# Patient Record
Sex: Female | Born: 1960 | Race: White | Hispanic: No | Smoking: Former smoker
Health system: Southern US, Community
[De-identification: ages and names within clinical notes are randomized; demographics above are authoritative.]

## PROBLEM LIST (undated history)

## (undated) DIAGNOSIS — D259 Leiomyoma of uterus, unspecified: Secondary | ICD-10-CM

## (undated) DIAGNOSIS — N920 Excessive and frequent menstruation with regular cycle: Secondary | ICD-10-CM

## (undated) DIAGNOSIS — T8859XA Other complications of anesthesia, initial encounter: Secondary | ICD-10-CM

## (undated) DIAGNOSIS — J302 Other seasonal allergic rhinitis: Secondary | ICD-10-CM

## (undated) DIAGNOSIS — N840 Polyp of corpus uteri: Secondary | ICD-10-CM

## (undated) DIAGNOSIS — I10 Essential (primary) hypertension: Secondary | ICD-10-CM

## (undated) DIAGNOSIS — K5730 Diverticulosis of large intestine without perforation or abscess without bleeding: Secondary | ICD-10-CM

## (undated) DIAGNOSIS — B009 Herpesviral infection, unspecified: Secondary | ICD-10-CM

## (undated) DIAGNOSIS — D75839 Thrombocytosis, unspecified: Secondary | ICD-10-CM

## (undated) HISTORY — DX: Other seasonal allergic rhinitis: J30.2

## (undated) HISTORY — DX: Herpesviral infection, unspecified: B00.9

---

## 2000-11-25 ENCOUNTER — Other Ambulatory Visit: Admission: RE | Admit: 2000-11-25 | Discharge: 2000-11-25 | Payer: Self-pay | Admitting: Obstetrics and Gynecology

## 2003-10-17 ENCOUNTER — Other Ambulatory Visit: Admission: RE | Admit: 2003-10-17 | Discharge: 2003-10-17 | Payer: Self-pay | Admitting: Obstetrics and Gynecology

## 2003-11-17 ENCOUNTER — Encounter: Admission: RE | Admit: 2003-11-17 | Discharge: 2003-11-17 | Payer: Self-pay | Admitting: Obstetrics and Gynecology

## 2004-09-04 ENCOUNTER — Ambulatory Visit (HOSPITAL_COMMUNITY): Admission: RE | Admit: 2004-09-04 | Discharge: 2004-09-04 | Payer: Self-pay | Admitting: Obstetrics and Gynecology

## 2004-09-04 ENCOUNTER — Encounter (INDEPENDENT_AMBULATORY_CARE_PROVIDER_SITE_OTHER): Payer: Self-pay | Admitting: Specialist

## 2004-09-04 HISTORY — PX: HYSTEROSCOPY WITH D & C: SHX1775

## 2004-09-08 HISTORY — PX: ABLATION: SHX5711

## 2004-10-29 ENCOUNTER — Other Ambulatory Visit: Admission: RE | Admit: 2004-10-29 | Discharge: 2004-10-29 | Payer: Self-pay | Admitting: Obstetrics and Gynecology

## 2007-03-31 ENCOUNTER — Encounter: Admission: RE | Admit: 2007-03-31 | Discharge: 2007-03-31 | Payer: Self-pay | Admitting: Obstetrics and Gynecology

## 2008-02-02 ENCOUNTER — Emergency Department: Payer: Self-pay | Admitting: Emergency Medicine

## 2010-09-29 ENCOUNTER — Encounter: Payer: Self-pay | Admitting: Obstetrics and Gynecology

## 2011-01-24 NOTE — Op Note (Signed)
Terri Gamble, Terri Gamble          ACCOUNT NO.:  [id number]   MEDICAL RECORD NO.:  [id number]          PATIENT TYPE:  AMB   LOCATION:  SDC                           FACILITY:  [facility]   PHYSICIAN:  Huel Cote, M.D. DATE OF BIRTH:  1960/10/03   DATE OF PROCEDURE:  09/04/2004  DATE OF DISCHARGE:                                 OPERATIVE REPORT   PREOPERATIVE DIAGNOSES:  1.  Abnormal bleeding.  2.  Possible uterine polyps.   POSTOPERATIVE DIAGNOSES:  The intrauterine cavity was distorted with  synechiae versus a septum at the fundal midline.  There were no obvious  polyps noted.  Endometrial curettings were done.   PROCEDURE:  Diagnostic hysteroscopy and dilatation and curettage.   SURGEON:  Huel Cote, M.D.   ANESTHESIA:  LMA.   FINDINGS:  As above, the uterine cavity was noted to be slightly distorted.  There were no polyps noted, and the patient was not a candidate for ablation  given the uterine cavity.   FLUIDS:  Estimated blood loss 100 mL, urine output of 50 mL straight  catheter prior to procedure, and hysteroscopic deficit approximately 300 mL  or less, given that much of the deficit was on the floor and unable to be  measured.   PROCEDURE:  The patient was taken to the operating room, where anesthesia  was obtained without difficulty.  She was then prepped and draped in the  normal sterile fashion in the dorsal lithotomy position.  A speculum was  then placed within the patient's vagina and the cervix identified and  grasped with a single-tooth tenaculum anteriorly.  The uterus was then  sounded to approximately 7 cm and a Hegar dilator placed within the cervical  os and measured the cervix to be approximately 4 cm.  The cervix was then  sequentially easily dilated with the [facility] dilators to a maximum of 31 and  the hysteroscope was easily introduced into the uterus.  This was then  utilized to carefully view the intrauterine cavity.  The cavity itself was  significant for the midline defect, which appeared to be a partial septum at  the midline of the fundus versus an old synechia.  The endometrium itself  did not appear grossly abnormal, and some curettings were obtained.  There  were no polyps noted in the cavity itself and the NovaSure was not felt to  be appropriate given the distortion of the uterine cavity.  The hysteroscope  was then removed and the deficit at one point was noted to be 800 mL.  However, the cervix had been very dilated and most of the deficit was  accounted for as runoff from the vagina onto the floor and drapes, which  probably attested for more than 500 mL of the deficit.  At the conclusion of  the procedure, the deficit meter read approximately 700 mL and again most of  the deficit was accounted for on the drapes and the floor.  Given the  deficit, however, the decision was made not to proceed with any resecting of  the uterine septum, given that the patient is past childbearing age and it  is not likely to be contributing to her abnormal bleeding.  Therefore, all  instruments and sponges were removed from the patient's vagina and the  hysteroscope was removed from her uterus.  The tenaculum was removed from  the anterior lip and  tenaculum site bleeding was controlled with both ring forceps and silver  nitrate stick.  The endometrial curettings were obtained with sharp  curettage and sent to pathology, although they were very scant in nature,  and the patient was then awakened and taken to the recovery room in stable  condition.     Kath   KR/MEDQ  D:  09/04/2004  T:  09/04/2004  Job:  [id number]

## 2011-01-24 NOTE — H&P (Signed)
Terri Gamble, KOVAL          ACCOUNT NO.:  [id number]   MEDICAL RECORD NO.:  [id number]          PATIENT TYPE:  AMB   LOCATION:  SDC                           [facility]:  [facility]   PHYSICIAN:  Huel Cote, M.D. DATE OF BIRTH:  March 11, 1961   DATE OF ADMISSION:  DATE OF DISCHARGE:                                HISTORY & PHYSICAL   HISTORY OF PRESENT ILLNESS:  Ms. Miguez is a 50 year old G2, P2, who was  admitted to undergo a hysteroscopy with possible resection of a polyp and  Novasure ablation for an ongoing problem with persistent daily bleeding.  Beginning in October the patient had been having normal cycles and this  began to become irregular with spotting almost daily.  The patient continued  to spot for approximately eight weeks and had a heavy bleed in November with  clots and heavy flow.  She did have a pregnancy test which was negative and  underwent a sonohysterogram at that time.  On sonohysterogram the patient  had two possible endometrial polyps demonstrated in the uterine fundus and  the cavity was otherwise within normal limits.  Given her very heavy  bleeding the patient was counseled as to possible options.  Given that she  was a smoker there were not any hormonal options and she desired surgical  treatment with polypectomy and possible Novasure ablation.   PAST MEDICAL HISTORY:  Noncontributory.   PAST SURGICAL HISTORY:  Significant for two cesarean sections.   PAST OBSTETRICAL HISTORY:  Just the two C-sections as stated.   PAST GYN HISTORY:  No abnormal Pap smears.   ALLERGIES:  She is LATEX allergic and develops a rash when exposed to this.   MEDICATIONS:  None.   PHYSICAL EXAMINATION:  VITAL SIGNS:  Weight is 235 pounds, blood pressure  120/85.  CARDIAC:  Regular rate and rhythm.  LUNGS:  Clear.  ABDOMEN:  Soft and nontender.  PELVIC:  She has normal external genitalia.  Cervix has no lesions.  Uterus  is upper limits of normal in size.  Adnexa  has no masses.  On  sonohysterogram her uterus measures approximately 10 cm and is retroverted.  She does have a small posterior fibroid; however, no other large fibroids  present.  She had two endometrial polyps which were noted on the ultrasound  extending off the superior fundus.  These measured approximately 1 cm and  she had a second small polyp which appeared on the posterior wall of the  uterus measuring 1 cm.  Given her ongoing problems with bleeding the patient  is desirous of definitive surgical therapy and was counseled for  hysteroscopy and  Novasure ablation.  She understands the risks of the procedure including  bleeding and infection and possible damage to the uterus including uterine  perforation.  Given these risks and consideration the patient elects to  proceed with the procedure as stated.     Kath   KR/MEDQ  D:  08/28/2004  T:  08/28/2004  Job:  [id number]

## 2011-12-11 ENCOUNTER — Emergency Department: Payer: Self-pay | Admitting: *Deleted

## 2011-12-11 LAB — BASIC METABOLIC PANEL
Calcium, Total: 9.1 mg/dL (ref 8.5–10.1)
Chloride: 104 mmol/L (ref 98–107)
Co2: 27 mmol/L (ref 21–32)
EGFR (African American): >60
Glucose: 83 mg/dL (ref 65–99)
Potassium: 4.1 mmol/L (ref 3.5–5.1)

## 2011-12-11 LAB — CBC
MCHC: 33.8 g/dL (ref 32.0–36.0)
Platelet: 371 10*3/uL (ref 150–440)
RBC: 4.83 10*6/uL (ref 3.80–5.20)

## 2014-09-08 HISTORY — PX: COLONOSCOPY: SHX174

## 2014-11-15 ENCOUNTER — Ambulatory Visit (AMBULATORY_SURGERY_CENTER): Payer: Self-pay | Admitting: *Deleted

## 2014-11-15 VITALS — Ht 61.0 in | Wt 252.8 lb

## 2014-11-15 DIAGNOSIS — Z1211 Encounter for screening for malignant neoplasm of colon: Secondary | ICD-10-CM

## 2014-11-15 MED ORDER — MOVIPREP 100 G PO SOLR: ORAL | Status: DC

## 2014-11-15 NOTE — Progress Notes (Signed)
No allergies to eggs or soy. No problems with anesthesia.  Pt given Emmi instructions for colonoscopy  No oxygen use  No diet drug use  

## 2014-11-22 ENCOUNTER — Telehealth: Payer: Self-pay | Admitting: Gastroenterology

## 2014-11-22 NOTE — Telephone Encounter (Signed)
Yes charge 

## 2014-11-24 ENCOUNTER — Encounter: Payer: Self-pay | Admitting: Gastroenterology

## 2014-12-12 ENCOUNTER — Encounter: Payer: Self-pay | Admitting: Gastroenterology

## 2015-01-19 ENCOUNTER — Encounter: Payer: Self-pay | Admitting: Gastroenterology

## 2015-01-26 ENCOUNTER — Encounter: Payer: Self-pay | Admitting: Gastroenterology

## 2015-03-01 ENCOUNTER — Encounter: Payer: Self-pay | Admitting: Gastroenterology

## 2015-03-09 ENCOUNTER — Encounter: Payer: Self-pay | Admitting: Gastroenterology

## 2015-05-22 ENCOUNTER — Ambulatory Visit (AMBULATORY_SURGERY_CENTER): Payer: Self-pay | Admitting: *Deleted

## 2015-05-22 VITALS — Ht 61.0 in | Wt 256.0 lb

## 2015-05-22 DIAGNOSIS — Z1211 Encounter for screening for malignant neoplasm of colon: Secondary | ICD-10-CM

## 2015-05-22 MED ORDER — NA SULFATE-K SULFATE-MG SULF 17.5-3.13-1.6 GM/177ML PO SOLN: 1.0000 | Freq: Once | ORAL | Status: DC

## 2015-05-22 NOTE — Progress Notes (Signed)
No egg or soy allergy. No anesthesia problems.  No home O2.  No diet meds.  

## 2015-06-01 ENCOUNTER — Encounter: Payer: Self-pay | Admitting: Gastroenterology

## 2015-06-01 ENCOUNTER — Ambulatory Visit (AMBULATORY_SURGERY_CENTER): Payer: 59 | Admitting: Gastroenterology

## 2015-06-01 VITALS — BP 122/69 | HR 84 | Temp 98.2°F | Resp 16 | Ht 61.0 in | Wt 256.0 lb

## 2015-06-01 DIAGNOSIS — Z1211 Encounter for screening for malignant neoplasm of colon: Secondary | ICD-10-CM

## 2015-06-01 MED ORDER — SODIUM CHLORIDE 0.9 % IV SOLN: 500.0000 mL | INTRAVENOUS | Status: DC

## 2015-06-01 NOTE — Patient Instructions (Signed)
YOU HAD AN ENDOSCOPIC PROCEDURE TODAY AT THE [facility]:   Refer to the procedure report that was given to you for any specific questions about what was found during the examination.  If the procedure report does not answer your questions, please call your gastroenterologist to clarify.  If you requested that your care partner not be given the details of your procedure findings, then the procedure report has been included in a sealed envelope for you to review at your convenience later.  YOU SHOULD EXPECT: Some feelings of bloating in the abdomen. Passage of more gas than usual.  Walking can help get rid of the air that was put into your GI tract during the procedure and reduce the bloating. If you had a lower endoscopy (such as a colonoscopy or flexible sigmoidoscopy) you may notice spotting of blood in your stool or on the toilet paper. If you underwent a bowel prep for your procedure, you may not have a normal bowel movement for a few days.  Please Note:  You might notice some irritation and congestion in your nose or some drainage.  This is from the oxygen used during your procedure.  There is no need for concern and it should clear up in a day or so.  SYMPTOMS TO REPORT IMMEDIATELY:   Following lower endoscopy (colonoscopy or flexible sigmoidoscopy):  Excessive amounts of blood in the stool  Significant tenderness or worsening of abdominal pains  Swelling of the abdomen that is new, acute  Fever of 100F or higher   For urgent or emergent issues, a gastroenterologist can be reached at any hour by calling [phone].   DIET: Your first meal following the procedure should be a small meal and then it is ok to progress to your normal diet. Heavy or fried foods are harder to digest and may make you feel nauseous or bloated.  Likewise, meals heavy in dairy and vegetables can increase bloating.  Drink plenty of fluids but you should avoid alcoholic beverages for 24  hours.  ACTIVITY:  You should plan to take it easy for the rest of today and you should NOT DRIVE or use heavy machinery until tomorrow (because of the sedation medicines used during the test).    FOLLOW UP: Our staff will call the number listed on your records the next business day following your procedure to check on you and address any questions or concerns that you may have regarding the information given to you following your procedure. If we do not reach you, we will leave a message.  However, if you are feeling well and you are not experiencing any problems, there is no need to return our call.  We will assume that you have returned to your regular daily activities without incident.  If any biopsies were taken you will be contacted by phone or by letter within the next 1-3 weeks.  Please call us at [phone] if you have not heard about the biopsies in 3 weeks.    SIGNATURES/CONFIDENTIALITY: You and/or your care partner have signed paperwork which will be entered into your electronic medical record.  These signatures attest to the fact that that the information above on your After Visit Summary has been reviewed and is understood.  Full responsibility of the confidentiality of this discharge information lies with you and/or your care-partner. 

## 2015-06-01 NOTE — Progress Notes (Signed)
Report to PACU, RN, vss, BBS= Clear.  

## 2015-06-01 NOTE — Op Note (Signed)
[place]  [address] [place], [address]   COLONOSCOPY PROCEDURE REPORT  PATIENT: Terri Gamble, Terri Gamble  MR#: [id number] BIRTHDATE: [birth year] , 59  yrs. old GENDER: female ENDOSCOPIST: Ladene Artist, MD, [facility] REFERRED BY:  Paula Compton, M.D. PROCEDURE DATE:  06/01/2015 PROCEDURE:   Colonoscopy, screening First Screening Colonoscopy - Avg.  risk and is 50 yrs.  old or older Yes.  Prior Negative Screening - Now for repeat screening. N/A  History of Adenoma - Now for follow-up colonoscopy & has been > or = to 3 yrs.  N/A  Polyps removed today? No Recommend repeat exam, <10 yrs? No ASA CLASS:   Class II INDICATIONS:Screening for colonic neoplasia and Colorectal Neoplasm Risk Assessment for this procedure is average risk. MEDICATIONS: Monitored anesthesia care and Propofol 300 mg IV DESCRIPTION OF PROCEDURE:   After the risks benefits and alternatives of the procedure were thoroughly explained, informed consent was obtained.  The digital rectal exam revealed no abnormalities of the rectum.   The LB PCF Q180 [id number]  endoscope was introduced through the anus and advanced to the cecum, which was identified by both the appendix and ileocecal valve. No adverse events experienced.   The quality of the prep was adequate (Suprep was used)  The instrument was then slowly withdrawn as the colon was fully examined. Estimated blood loss is zero unless otherwise noted in this procedure report.    COLON FINDINGS: There was moderate diverticulosis noted in the sigmoid colon.   There was mild diverticulosis noted at the hepatic flexure.   The examination was otherwise appeared normal. Retroflexed views revealed no abnormalities. The time to cecum = 4.1 Withdrawal time = 9.3   The scope was withdrawn and the procedure completed. COMPLICATIONS: There were no immediate complications.  ENDOSCOPIC IMPRESSION: 1.   Moderate diverticulosis in the sigmoid colon 2.   Mild  diverticulosis at the hepatic flexure 3.   The examination otherwise appeared normal  RECOMMENDATIONS: 1.  High fiber diet with liberal fluid intake. 2.  Continue current colorectal screening recommendations for "routine risk" patients with a repeat colonoscopy in 10 years.  eSigned:  Ladene Artist, MD, [facility] 06/01/2015 10:23 AM

## 2015-06-04 ENCOUNTER — Telehealth: Payer: Self-pay | Admitting: *Deleted

## 2015-06-04 NOTE — Telephone Encounter (Signed)
  Follow up Call-  Call back number 06/01/2015  Post procedure Call Back phone  # [phone]  Permission to leave phone message Yes     No answer, left message.

## 2015-06-05 HISTORY — PX: COLONOSCOPY WITH PROPOFOL: SHX5780

## 2016-02-14 ENCOUNTER — Other Ambulatory Visit: Payer: Self-pay | Admitting: Obstetrics and Gynecology

## 2016-02-14 DIAGNOSIS — R928 Other abnormal and inconclusive findings on diagnostic imaging of breast: Secondary | ICD-10-CM

## 2016-02-20 ENCOUNTER — Ambulatory Visit
Admission: RE | Admit: 2016-02-20 | Discharge: 2016-02-20 | Disposition: A | Discharge status: Home / Self Care | Payer: 59 | Source: Ambulatory Visit | Attending: Obstetrics and Gynecology | Admitting: Obstetrics and Gynecology

## 2016-02-20 ENCOUNTER — Other Ambulatory Visit: Payer: Self-pay | Admitting: Obstetrics and Gynecology

## 2016-02-20 DIAGNOSIS — R928 Other abnormal and inconclusive findings on diagnostic imaging of breast: Secondary | ICD-10-CM

## 2016-02-26 ENCOUNTER — Ambulatory Visit
Admission: RE | Admit: 2016-02-26 | Discharge: 2016-02-26 | Disposition: A | Discharge status: Home / Self Care | Payer: 59 | Source: Ambulatory Visit | Attending: Obstetrics and Gynecology | Admitting: Obstetrics and Gynecology

## 2016-02-26 ENCOUNTER — Other Ambulatory Visit: Payer: Self-pay | Admitting: Obstetrics and Gynecology

## 2016-02-26 DIAGNOSIS — R928 Other abnormal and inconclusive findings on diagnostic imaging of breast: Secondary | ICD-10-CM

## 2016-02-26 DIAGNOSIS — N6001 Solitary cyst of right breast: Secondary | ICD-10-CM

## 2017-02-18 ENCOUNTER — Emergency Department: Payer: 59

## 2017-02-18 ENCOUNTER — Emergency Department
Admission: EM | Admit: 2017-02-18 | Discharge: 2017-02-18 | Disposition: A | Discharge status: Home / Self Care | Payer: 59 | Attending: Emergency Medicine | Admitting: Emergency Medicine

## 2017-02-18 ENCOUNTER — Encounter: Payer: Self-pay | Admitting: Emergency Medicine

## 2017-02-18 DIAGNOSIS — R1011 Right upper quadrant pain: Secondary | ICD-10-CM

## 2017-02-18 DIAGNOSIS — Z87891 Personal history of nicotine dependence: Secondary | ICD-10-CM | POA: Diagnosis not present

## 2017-02-18 DIAGNOSIS — K858 Other acute pancreatitis without necrosis or infection: Secondary | ICD-10-CM | POA: Diagnosis not present

## 2017-02-18 LAB — COMPREHENSIVE METABOLIC PANEL
ALBUMIN: 3.8 g/dL (ref 3.5–5.0)
ALT: 20 U/L (ref 14–54)
AST: 20 U/L (ref 15–41)
Alkaline Phosphatase: 105 U/L (ref 38–126)
Anion gap: 7 (ref 5–15)
BUN: 13 mg/dL (ref 6–20)
CHLORIDE: 102 mmol/L (ref 101–111)
CO2: 27 mmol/L (ref 22–32)
CREATININE: 0.56 mg/dL (ref 0.44–1.00)
Calcium: 9.4 mg/dL (ref 8.9–10.3)
GFR calc Af Amer: >60 mL/min (ref >60)
GFR calc non Af Amer: >60 mL/min (ref >60)
GLUCOSE: 157 mg/dL — AB (ref 65–99)
POTASSIUM: 4 mmol/L (ref 3.5–5.1)
Sodium: 136 mmol/L (ref 135–145)
Total Bilirubin: 0.7 mg/dL (ref 0.3–1.2)
Total Protein: 8.1 g/dL (ref 6.5–8.1)

## 2017-02-18 LAB — URINALYSIS, COMPLETE (UACMP) WITH MICROSCOPIC
BILIRUBIN URINE: NEGATIVE
Glucose, UA: NEGATIVE mg/dL
Ketones, ur: 5 mg/dL — AB
LEUKOCYTES UA: NEGATIVE
Nitrite: NEGATIVE
Protein, ur: 30 mg/dL — AB
SPECIFIC GRAVITY, URINE: 1.026 (ref 1.005–1.030)
WBC, UA: NONE SEEN WBC/hpf (ref 0–5)
pH: 5 (ref 5.0–8.0)

## 2017-02-18 LAB — CBC
HEMATOCRIT: 41.5 % (ref 35.0–47.0)
Hemoglobin: 14 g/dL (ref 12.0–16.0)
MCH: 29 pg (ref 26.0–34.0)
MCHC: 33.9 g/dL (ref 32.0–36.0)
MCV: 85.5 fL (ref 80.0–100.0)
PLATELETS: 380 10*3/uL (ref 150–440)
RBC: 4.85 MIL/uL (ref 3.80–5.20)
RDW: 13.5 % (ref 11.5–14.5)
WBC: 18.9 10*3/uL — AB (ref 3.6–11.0)

## 2017-02-18 LAB — LIPASE, BLOOD: LIPASE: 73 U/L — AB (ref 11–51)

## 2017-02-18 LAB — LACTATE DEHYDROGENASE: LDH: 147 U/L (ref 98–192)

## 2017-02-18 MED ORDER — ONDANSETRON HCL 4 MG/2ML IJ SOLN
4.0000 mg | Freq: Once | INTRAMUSCULAR | Status: AC
  Administered 2017-02-18: 4 mg via INTRAVENOUS
  Filled 2017-02-18: qty 2

## 2017-02-18 MED ORDER — ONDANSETRON HCL 4 MG/2ML IJ SOLN
INTRAMUSCULAR | Status: AC
  Filled 2017-02-18: qty 2

## 2017-02-18 MED ORDER — HYDROMORPHONE HCL 1 MG/ML IJ SOLN: 0.5000 mg | Freq: Once | INTRAMUSCULAR | Status: DC

## 2017-02-18 MED ORDER — ONDANSETRON 4 MG PO TBDP
4.0000 mg | ORAL_TABLET | Freq: Once | ORAL | Status: AC | PRN
  Administered 2017-02-18: 4 mg via ORAL
  Filled 2017-02-18: qty 1

## 2017-02-18 MED ORDER — SODIUM CHLORIDE 0.9 % IV BOLUS (SEPSIS)
1000.0000 mL | Freq: Once | INTRAVENOUS | Status: AC
  Administered 2017-02-18: 1000 mL via INTRAVENOUS

## 2017-02-18 MED ORDER — ONDANSETRON HCL 4 MG/2ML IJ SOLN
4.0000 mg | Freq: Once | INTRAMUSCULAR | Status: AC
  Administered 2017-02-18: 4 mg via INTRAVENOUS

## 2017-02-18 MED ORDER — ACETAMINOPHEN 325 MG PO TABS: 650.0000 mg | ORAL_TABLET | Freq: Once | ORAL | Status: DC

## 2017-02-18 MED ORDER — ONDANSETRON 4 MG PO TBDP: 4.0000 mg | ORAL_TABLET | Freq: Three times a day (TID) | ORAL | 0 refills | Status: DC | PRN

## 2017-02-18 MED ORDER — KETOROLAC TROMETHAMINE 30 MG/ML IJ SOLN
30.0000 mg | Freq: Once | INTRAMUSCULAR | Status: AC
  Administered 2017-02-18: 30 mg via INTRAVENOUS

## 2017-02-18 MED ORDER — KETOROLAC TROMETHAMINE 30 MG/ML IJ SOLN
INTRAMUSCULAR | Status: AC
  Filled 2017-02-18: qty 1

## 2017-02-18 MED ORDER — MORPHINE SULFATE (PF) 4 MG/ML IV SOLN
4.0000 mg | Freq: Once | INTRAVENOUS | Status: AC
  Administered 2017-02-18: 4 mg via INTRAVENOUS
  Filled 2017-02-18: qty 1

## 2017-02-18 MED ORDER — OXYCODONE HCL 5 MG PO TABS
5.0000 mg | ORAL_TABLET | Freq: Once | ORAL | Status: AC
  Administered 2017-02-18: 5 mg via ORAL
  Filled 2017-02-18: qty 1

## 2017-02-18 MED ORDER — ACETAMINOPHEN 500 MG PO TABS
1000.0000 mg | ORAL_TABLET | Freq: Once | ORAL | Status: AC
  Administered 2017-02-18: 1000 mg via ORAL
  Filled 2017-02-18: qty 2

## 2017-02-18 MED ORDER — OXYCODONE-ACETAMINOPHEN 5-325 MG PO TABS: 1.0000 | ORAL_TABLET | Freq: Four times a day (QID) | ORAL | 0 refills | Status: DC | PRN

## 2017-02-18 NOTE — ED Triage Notes (Signed)
Pt in via POV, sent over from urgent care with complaints of epigastric pain w/ N/V, denies any diarrhea since Monday.  Pt reports she cannot get comfortable.  Vitals WDL, NAD noted at this time.

## 2017-02-18 NOTE — ED Notes (Signed)
Pt had zofran in triage.  Will hold IV zofran at this time.

## 2017-02-18 NOTE — Discharge Instructions (Signed)
Take nausea medicine and pain medicine as needed. Slowly advance your diet every 24 hours. Start with clear fluids. If you are tolerating that well then advance to rice, toast, banana, bread diet. If you are tolerating that well after 24 hours you can continue to advance your diet. Follow-up with your doctor by Friday for reevaluation. Return to the emergency room if you have worsening abdominal pain, fever, or if your nausea and vomiting are not controlled at home.

## 2017-02-18 NOTE — ED Notes (Signed)
Pt given drink to trial PO challenge.

## 2017-02-18 NOTE — ED Notes (Signed)
Patient back from US.

## 2017-02-18 NOTE — ED Provider Notes (Signed)
[facility] Emergency Department Provider Note  ____________________________________________  Time seen: Approximately 12:54 PM  I have reviewed the triage vital signs and the nursing notes.   HISTORY  Chief Complaint Abdominal Pain   HPI Terri Gamble is a 56 y.o. female with no significant past medical history who presents for evaluation of abdominal pain. Patient reports 2 days of epigastric/right upper quadrant abdominal pain radiating to her back that is told in quality and 7 out of 10. She has had nausea and a few episodes of nonbloody nonbilious emesis. No diarrhea. She has had chills but no fever. No chest pain or shortness of breath. She reports decreased appetite and hasn't been eating since the pain started. Has not had a bowel movement since then. Patient has had 2 prior C-sections but no other abdominal surgeries. No prior history of SBO, no abdominal distention. She does not drink alcohol. She has no known history of gallbladder disease. No dysuria or hematuria.  Past Medical History:  Diagnosis Date  . Seasonal allergies     There are no active problems to display for this patient.   Past Surgical History:  Procedure Laterality Date  . [place]    Prior to Admission medications   Medication Sig Start Date End Date Taking? Authorizing Provider  Cholecalciferol (D3 ADULT PO) Take 4,000 Units by mouth daily.   Yes [provider]  ibuprofen (ADVIL,MOTRIN) 400 MG tablet Take 400 mg by mouth every 6 (six) hours as needed.   Yes [provider]  ondansetron (ZOFRAN ODT) 4 MG disintegrating tablet Take 1 tablet (4 mg total) by mouth every 8 (eight) hours as needed for nausea or vomiting. 02/18/17   Alfred Levins, [place], MD  oxyCODONE-acetaminophen (ROXICET) 5-325 MG tablet Take 1 tablet by mouth every 6 (six) hours as needed. 02/18/17 02/18/18  Rudene Re, MD    Allergies Codeine  Family  History  Problem Relation Age of Onset  . Heart disease Mother   . Kidney disease Mother   . Diabetes Maternal Grandmother   . Colon cancer Neg Hx     Social History Social History  Substance Use Topics  . Smoking status: Former Smoker    Quit date: 09/08/2004  . Smokeless tobacco: Never Used  . Alcohol use 0.0 oz/week     Comment: 1 drink a month    Review of Systems  Constitutional: Negative for fever. + chills Eyes: Negative for visual changes. ENT: Negative for sore throat. Neck: No neck pain  Cardiovascular: Negative for chest pain. Respiratory: Negative for shortness of breath. Gastrointestinal: + epigastric/ RUQ abdominal pain, nausea, and vomiting. No diarrhea. Genitourinary: Negative for dysuria. Musculoskeletal: Negative for back pain. Skin: Negative for rash. Neurological: Negative for headaches, weakness or numbness. Psych: No SI or HI  ____________________________________________   PHYSICAL EXAM:  VITAL SIGNS: ED Triage Vitals  Enc Vitals Group     BP 02/18/17 1109 (!) 159/77     Pulse Rate 02/18/17 1109 (!) 116     Resp 02/18/17 1109 16     Temp 02/18/17 1109 98.4 F (36.9 C)     Temp Source 02/18/17 1109 Oral     SpO2 02/18/17 1109 96 %     Weight 02/18/17 1109 255 lb (115.7 kg)     Height 02/18/17 1109 5\' 1"  (1.549 m)     Head Circumference --      Peak Flow --      Pain Score 02/18/17 1108  7     Pain Loc --      Pain Edu? --      Excl. in [place]? --     Constitutional: Alert and oriented. Well appearing and in no apparent distress. HEENT:      Head: Normocephalic and atraumatic.         Eyes: Conjunctivae are normal. Sclera is non-icteric.       Mouth/Throat: Mucous membranes are moist.       Neck: Supple with no signs of meningismus. Cardiovascular: Tachycardic with regular rhythm. No murmurs, gallops, or rubs. 2+ symmetrical distal pulses are present in all extremities. No JVD. Respiratory: Normal respiratory effort. Lungs are clear to  auscultation bilaterally. No wheezes, crackles, or rhonchi.  Gastrointestinal: Soft, ttp over the RUQ and epigastric region, negative Murphy sign, non distended with positive bowel sounds. No rebound or guarding. Musculoskeletal: Nontender with normal range of motion in all extremities. No edema, cyanosis, or erythema of extremities. Neurologic: Normal speech and language. Face is symmetric. Moving all extremities. No gross focal neurologic deficits are appreciated. Skin: Skin is warm, dry and intact. No rash noted. Psychiatric: Mood and affect are normal. Speech and behavior are normal.  ____________________________________________   LABS (all labs ordered are listed, but only abnormal results are displayed)  Labs Reviewed  LIPASE, BLOOD - Abnormal; Notable for the following:       Result Value   Lipase 73 (*)    All other components within normal limits  COMPREHENSIVE METABOLIC PANEL - Abnormal; Notable for the following:    Glucose, Bld 157 (*)    All other components within normal limits  CBC - Abnormal; Notable for the following:    WBC 18.9 (*)    All other components within normal limits  URINALYSIS, COMPLETE (UACMP) WITH MICROSCOPIC - Abnormal; Notable for the following:    Color, Urine AMBER (*)    APPearance CLOUDY (*)    Hgb urine dipstick MODERATE (*)    Ketones, ur 5 (*)    Protein, ur 30 (*)    Bacteria, UA RARE (*)    Squamous Epithelial / LPF 6-30 (*)    All other components within normal limits  LACTATE DEHYDROGENASE   ____________________________________________  EKG  ED ECG REPORT I, Rudene Re, the attending physician, personally viewed and interpreted this ECG.  Normal sinus rhythm, rate 97, normal intervals, normal axis, no ST elevations or depressions.  ____________________________________________  RADIOLOGY  RUQ [place]: No evidence of acute abnormality. No evidence of cholelithiasis or acute cholecystitis. Question hepatic steatosis.    ____________________________________________   PROCEDURES  Procedure(s) performed: None Procedures Critical Care performed:  None ____________________________________________   INITIAL IMPRESSION / ASSESSMENT AND PLAN / ED COURSE  56 y.o. female with no significant past medical history who presents for evaluation of epigastric/abdominal pain 2 days associated with nausea and vomiting. Patient is well-appearing, in no distress, afebrile, tachycardic however vitals improved after pain medication was given. She does have epigastric and right upper quadrant tenderness with no rebound or guarding and negative Murphy sign. Blood work showed leukocytosis with white count of 18.9, normal LFTs and T bili but elevated lipase of 73, urine with hematuria but no evidence of infection and patient has no symptoms of UTI. Presentation concerning for pancreatitis. We'll send patient for right upper quadrant ultrasound to rule out gallstone pancreatitis. Patient was given morphine, Zofran and fluids.   ED COURSE:  Patient tolerating by mouth. Pain is well controlled by mouth. LDH is pending.  Plan to repeat vitals after IV fluids and if patient continues to look well and LDH is less than 350 to discharge home. Care transferred to Dr. Mariea Clonts  Pertinent labs & imaging results that were available during my care of the patient were reviewed by me and considered in my medical decision making (see chart for details).    ____________________________________________   FINAL CLINICAL IMPRESSION(S) / ED DIAGNOSES  Final diagnoses:  RUQ abdominal pain  Other acute pancreatitis without infection or necrosis      NEW MEDICATIONS STARTED DURING THIS VISIT:  Discharge Medication List as of 02/18/2017  5:19 PM    START taking these medications   Details  ondansetron (ZOFRAN ODT) 4 MG disintegrating tablet Take 1 tablet (4 mg total) by mouth every 8 (eight) hours as needed for nausea or vomiting., Starting  Wed 02/18/2017, Print    oxyCODONE-acetaminophen (ROXICET) 5-325 MG tablet Take 1 tablet by mouth every 6 (six) hours as needed., Starting Wed 02/18/2017, Until Thu 02/18/2018, Print         Note:  This document was prepared using Dragon voice recognition software and may include unintentional dictation errors.    Alfred Levins, [place], MD 02/20/17 [phone]

## 2017-02-18 NOTE — ED Triage Notes (Signed)
Urgent care for patient; pt with n/v/ abdominal pain since Monday, decreased PO intake x1 day.

## 2017-02-19 ENCOUNTER — Telehealth: Payer: Self-pay | Admitting: Emergency Medicine

## 2017-02-19 NOTE — Telephone Encounter (Signed)
Pt calling to see if RX of oxycodone can be changed, due to nausea and headache. Reviewing information with Dr.Williams , no changes at this time, encourage to follow up with PCP. Or if symptoms continue to return to ED. Pt verbalized understanding

## 2017-02-24 ENCOUNTER — Encounter: Payer: Self-pay | Admitting: Gastroenterology

## 2017-03-30 ENCOUNTER — Ambulatory Visit: Payer: 59 | Admitting: Gastroenterology

## 2017-05-19 ENCOUNTER — Other Ambulatory Visit (INDEPENDENT_AMBULATORY_CARE_PROVIDER_SITE_OTHER): Payer: 59

## 2017-05-19 ENCOUNTER — Ambulatory Visit (INDEPENDENT_AMBULATORY_CARE_PROVIDER_SITE_OTHER): Payer: 59 | Admitting: Gastroenterology

## 2017-05-19 ENCOUNTER — Encounter: Payer: Self-pay | Admitting: Gastroenterology

## 2017-05-19 VITALS — BP 124/76 | HR 92 | Ht 61.0 in | Wt 260.8 lb

## 2017-05-19 DIAGNOSIS — R1011 Right upper quadrant pain: Secondary | ICD-10-CM | POA: Diagnosis not present

## 2017-05-19 DIAGNOSIS — R748 Abnormal levels of other serum enzymes: Secondary | ICD-10-CM

## 2017-05-19 DIAGNOSIS — R319 Hematuria, unspecified: Secondary | ICD-10-CM | POA: Diagnosis not present

## 2017-05-19 LAB — COMPREHENSIVE METABOLIC PANEL
ALT: 19 U/L (ref 0–35)
AST: 17 U/L (ref 0–37)
Albumin: 4.1 g/dL (ref 3.5–5.2)
Alkaline Phosphatase: 115 U/L (ref 39–117)
BILIRUBIN TOTAL: 0.2 mg/dL (ref 0.2–1.2)
BUN: 20 mg/dL (ref 6–23)
CO2: 26 meq/L (ref 19–32)
Calcium: 9.8 mg/dL (ref 8.4–10.5)
Chloride: 101 mEq/L (ref 96–112)
Creatinine, Ser: 0.83 mg/dL (ref 0.40–1.20)
GFR: 75.02 mL/min (ref >60.00)
GLUCOSE: 89 mg/dL (ref 70–99)
POTASSIUM: 3.8 meq/L (ref 3.5–5.1)
SODIUM: 137 meq/L (ref 135–145)
TOTAL PROTEIN: 7.2 g/dL (ref 6.0–8.3)

## 2017-05-19 LAB — CBC WITH DIFFERENTIAL/PLATELET
BASOS ABS: 0.1 10*3/uL (ref 0.0–0.1)
BASOS PCT: 1.1 % (ref 0.0–3.0)
EOS ABS: 0.2 10*3/uL (ref 0.0–0.7)
Eosinophils Relative: 1.9 % (ref 0.0–5.0)
HEMATOCRIT: 41.7 % (ref 36.0–46.0)
HEMOGLOBIN: 14.1 g/dL (ref 12.0–15.0)
LYMPHS PCT: 24.4 % (ref 12.0–46.0)
Lymphs Abs: 2.3 10*3/uL (ref 0.7–4.0)
MCHC: 33.8 g/dL (ref 30.0–36.0)
MCV: 86.4 fl (ref 78.0–100.0)
Monocytes Absolute: 0.7 10*3/uL (ref 0.1–1.0)
Monocytes Relative: 6.8 % (ref 3.0–12.0)
NEUTROS PCT: 65.8 % (ref 43.0–77.0)
Neutro Abs: 6.3 10*3/uL (ref 1.4–7.7)
PLATELETS: 439 10*3/uL — AB (ref 150.0–400.0)
RBC: 4.83 Mil/uL (ref 3.87–5.11)
RDW: 13.7 % (ref 11.5–15.5)
WBC: 9.6 10*3/uL (ref 4.0–10.5)

## 2017-05-19 LAB — URINALYSIS, ROUTINE W REFLEX MICROSCOPIC
BILIRUBIN URINE: NEGATIVE
KETONES UR: NEGATIVE
LEUKOCYTES UA: NEGATIVE
NITRITE: NEGATIVE
PH: 5.5 (ref 5.0–8.0)
Specific Gravity, Urine: >=1.03 — AB (ref 1.000–1.030)
Total Protein, Urine: NEGATIVE
Urine Glucose: NEGATIVE
Urobilinogen, UA: 0.2 (ref 0.0–1.0)

## 2017-05-19 LAB — AMYLASE: AMYLASE: 55 U/L (ref 27–131)

## 2017-05-19 LAB — LIPASE: LIPASE: 15 U/L (ref 11.0–59.0)

## 2017-05-19 NOTE — Patient Instructions (Signed)
Your physician has requested that you go to the basement for lab work before leaving today.  Normal BMI (Body Mass Index- based on height and weight) is between 19 and 25. Your BMI today is Body mass index is 49.28 kg/m. Marland Kitchen Please consider follow up  regarding your BMI with your Primary Care Provider.  Thank you for choosing me and [facility].  Pricilla Riffle. Dagoberto Ligas., MD., Marval Regal

## 2017-05-19 NOTE — Progress Notes (Signed)
    History of Present Illness: This is a 56 year old female with a history of epigastric and right upper quadrant abdominal pain and N/V in June. She was evaluated in the [facility] ED on 02/18/2017 for 2 days of epigastric right upper quadrant pain radiating to her back. Laboratory findings: WBC=18.9, glucose=157 lipase=73, UA with moderate blood and 6-30 rbc/hpf. Abdominal ultrasound as below. Her symptoms resolved after a few days and have not recurred. Her only current GI complaint is a very rare episode of mild heartburn with certain foods such as raw onions, occurring every few weeks.  Abd [place] 02/2017 IMPRESSION: No evidence of acute abnormality. No evidence of cholelithiasis or acute cholecystitis. Question hepatic steatosis.  Current Medications, Allergies, Past Medical History, Past Surgical History, Family History and Social History were reviewed in [employer] record.  Physical Exam: General: Well developed, well nourished, obese, no acute distress Head: Normocephalic and atraumatic Eyes:  sclerae anicteric, EOMI Ears: Normal auditory acuity Mouth: No deformity or lesions Lungs: Clear throughout to auscultation Heart: Regular rate and rhythm; no murmurs, rubs or bruits Abdomen: Soft, non tender and non distended. No masses, hepatosplenomegaly or hernias noted. Normal Bowel sounds Musculoskeletal: Symmetrical with no gross deformities  Pulses:  Normal pulses noted Extremities: No clubbing, cyanosis, edema or deformities noted Neurological: Alert oriented x 4, grossly nonfocal Psychological:  Alert and cooperative. Normal mood and affect  Assessment and Recommendations:  1. Epigastric pain, right upper quadrant pain, nausea and vomiting. Resolved. Minimally elevated lipase likely does not represent pancreatitis. She did have an elevated white count and hematuria. Obtain CMP, CBC, amylase, lipase and urinalysis today. If abnormalities persist will further  evaluate.

## 2017-05-31 IMAGING — MG 2D DIGITAL DIAGNOSTIC UNILATERAL RIGHT MAMMOGRAM WITH CAD AND AD
4 series · 4 of 12 positions shown · non-contrast
Comparison: Previous exam(s).

CLINICAL DATA: Recall from screening mammography.

EXAM:
2D DIGITAL DIAGNOSTIC RIGHT MAMMOGRAM WITH ADJUNCT TOMO
ULTRASOUND RIGHT BREAST

[R MLO]
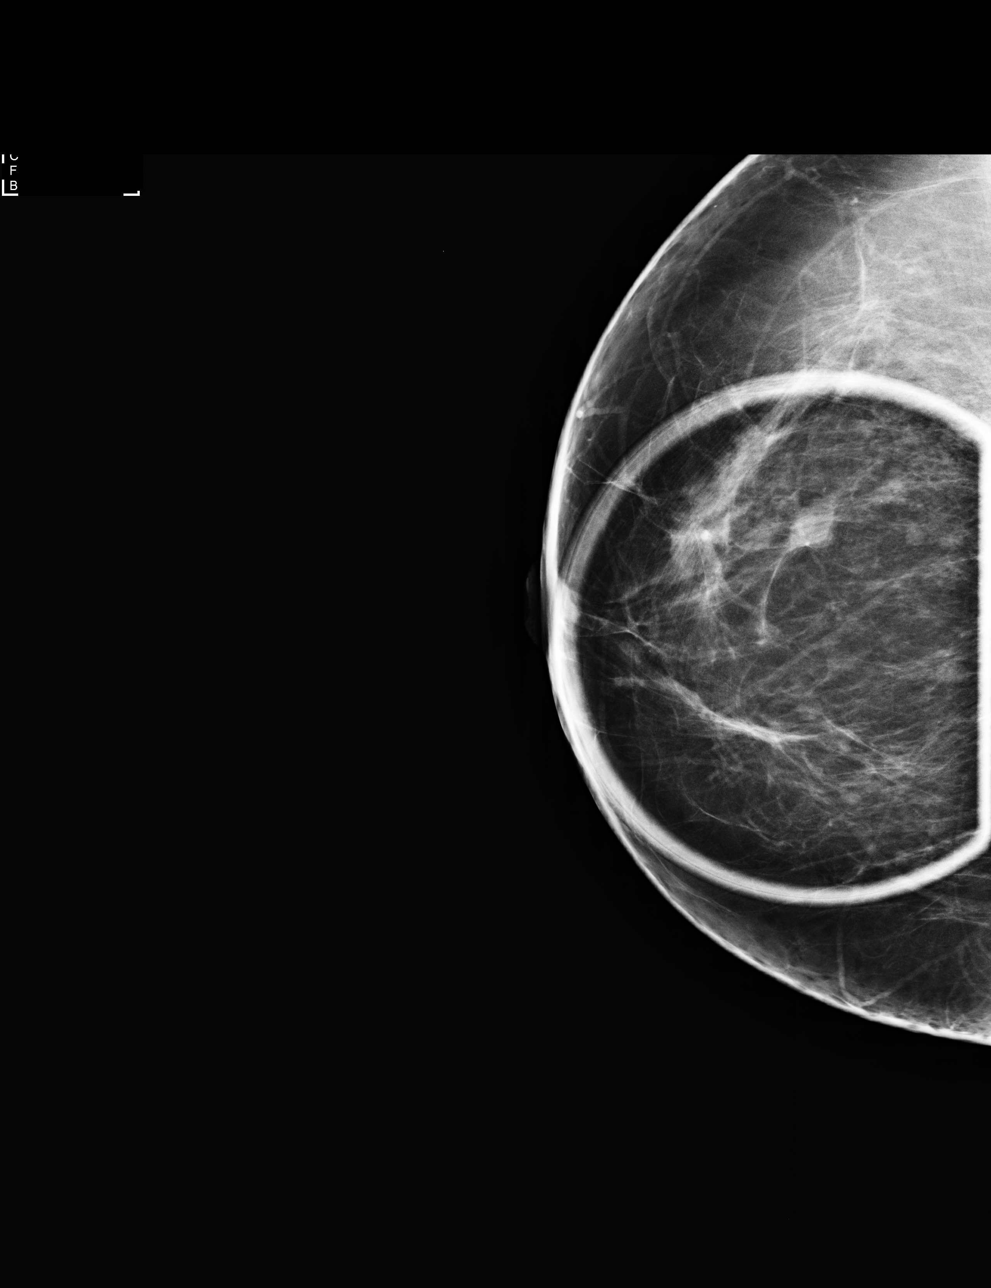

[R CC]
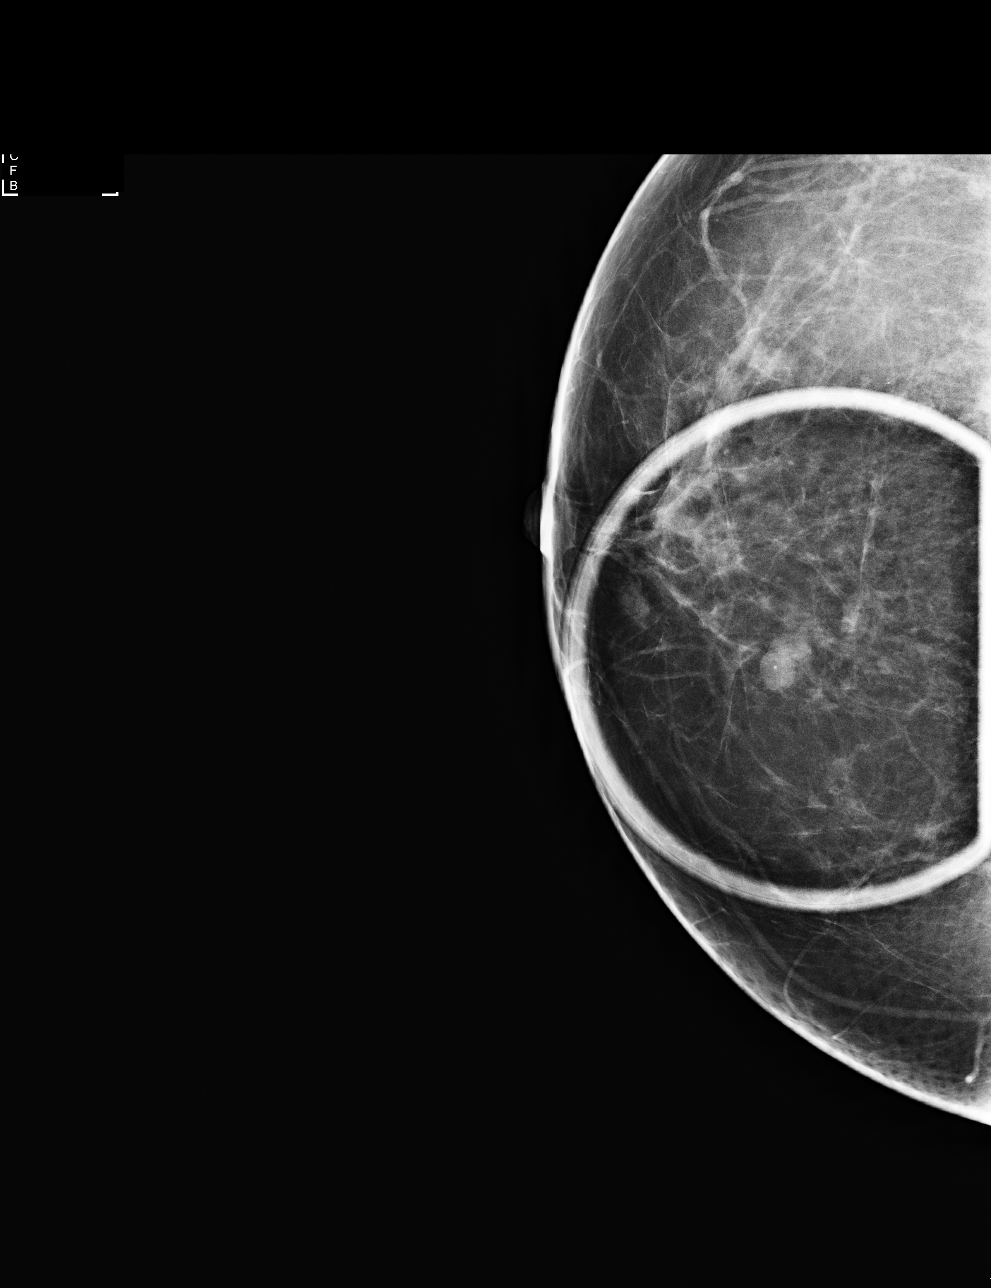

[R MLO tomo · tomo slice 41/82.0]
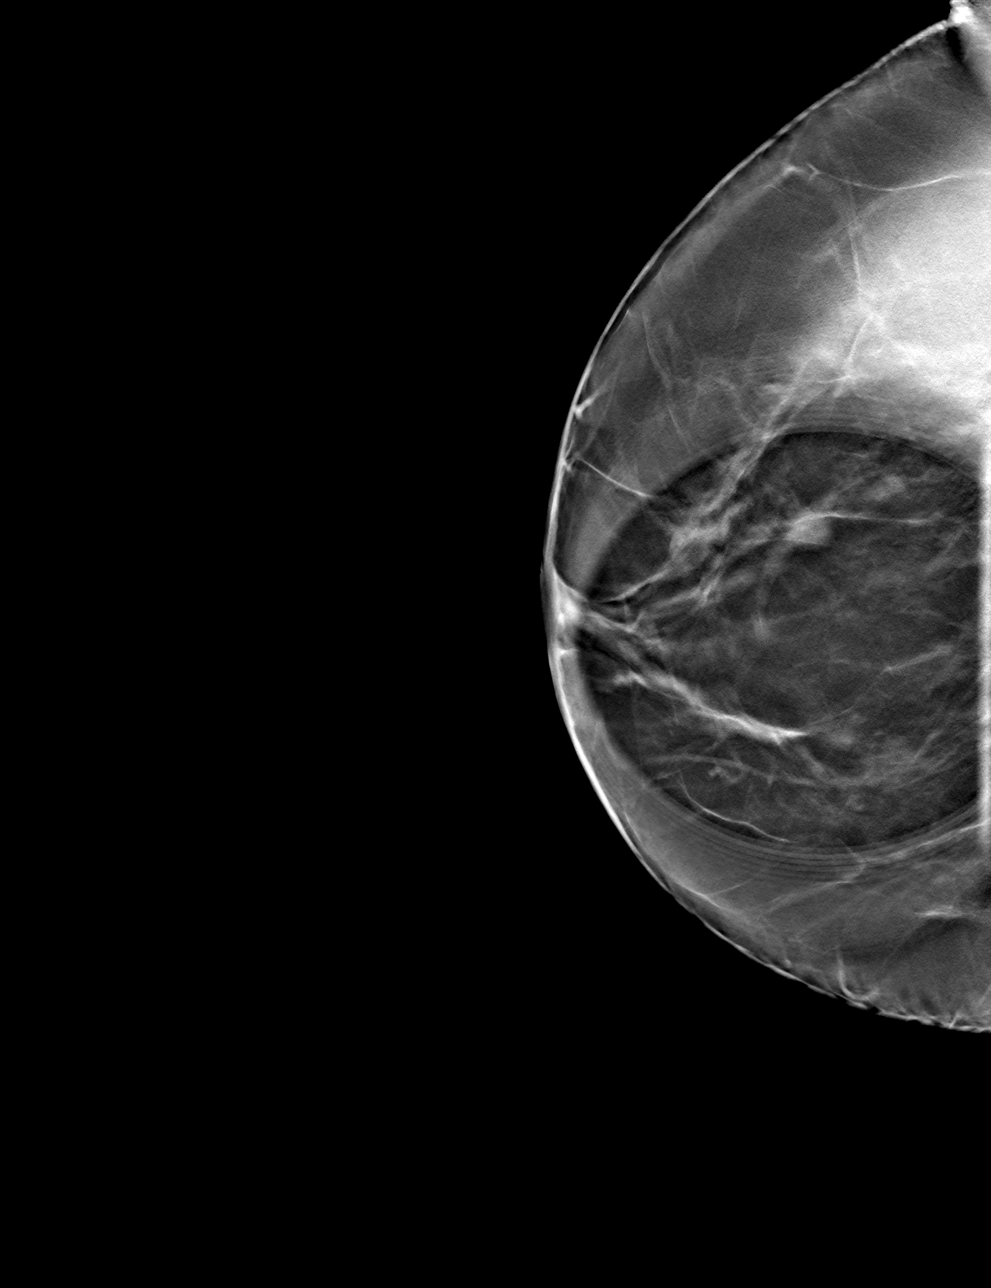

[R CC tomo · tomo slice 35/69.0]
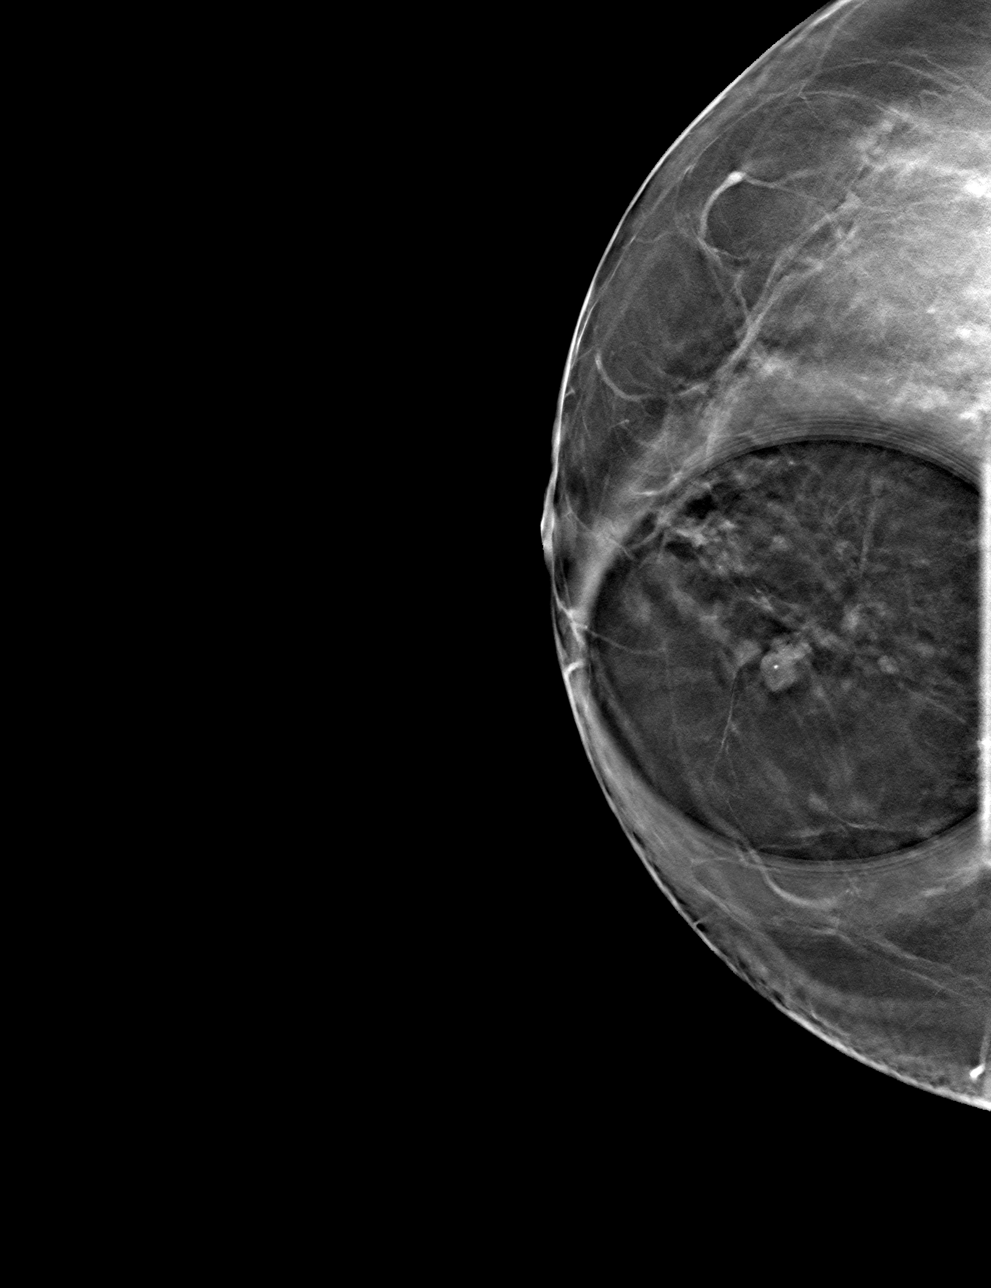

[4 of 12 positions shown; findings below may reference images not displayed]

ACR Breast Density Category b: There are scattered areas of
fibroglandular density.
FINDINGS: Additional views of the right breast demonstrate an oval,
circumscribed mass to be present located within the right breast at
1 o'clock position approximately 3 cm from the nipple.

On physical exam, there is no discrete palpable abnormality in the
area of mammographic concern.

Targeted ultrasound is performed, showing an oval, circumscribed
hypoechoic lesion with low-level internal echoes and possible
calcification located within the right breast at the 1 o'clock
position 3 cm from the nipple corresponding to the mammographic
finding. This measures 9 x 8 x 5 mm in size and most likely
represents a complicated cyst. I recommend ultrasound-guided right
breast cyst aspiration/ultrasound core biopsy.

Ultrasound the right axilla demonstrates normal axillary contents
and no evidence for adenopathy.
IMPRESSION: 9 mm lesion located within the right breast 1 o'clock position 3 cm
from the nipple most likely representing a complicated cyst versus
solid mass. Right breast ultrasound-guided cyst
aspiration/ultrasound core biopsy is recommended and will be
scheduled.

RECOMMENDATION:
Right breast ultrasound-guided cyst aspiration/core biopsy.

I have discussed the findings and recommendations with the patient.
Results were also provided in writing at the conclusion of the
visit. If applicable, a reminder letter will be sent to the patient
regarding the next appointment.

BI-RADS CATEGORY  Category 4A: Low suspicion for malignancy.

## 2017-05-31 IMAGING — US ULTRASOUND RIGHT BREAST LIMITED
1 series · 8 of 8 positions shown · non-contrast
Comparison: Previous exam(s).

CLINICAL DATA: Recall from screening mammography.

EXAM:
2D DIGITAL DIAGNOSTIC RIGHT MAMMOGRAM WITH ADJUNCT TOMO
ULTRASOUND RIGHT BREAST

[Series 1: ultrasound right breast limited · 0.07mm/px · 8 of 8 slices shown]
[im 1/8]
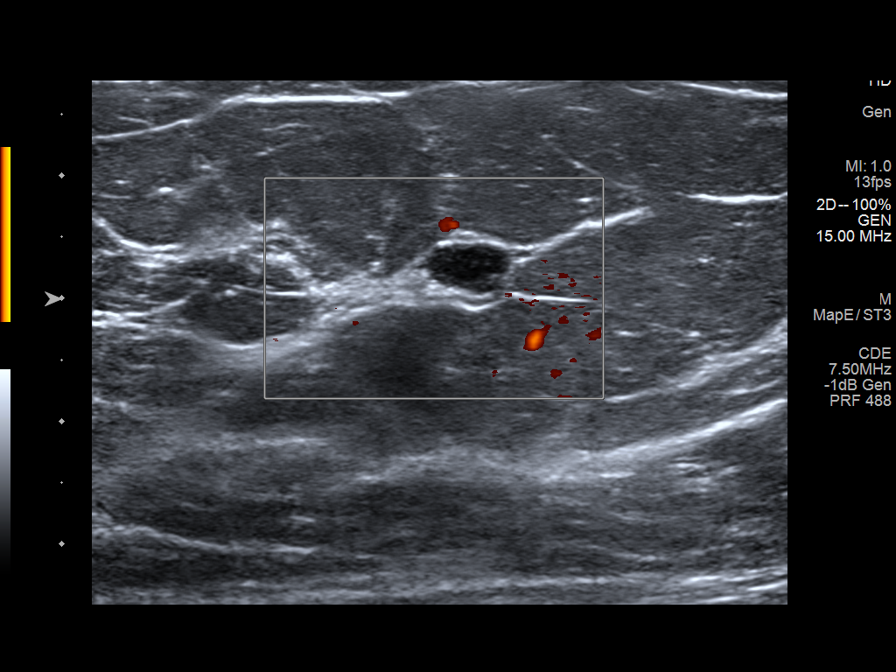
[im 2/8]
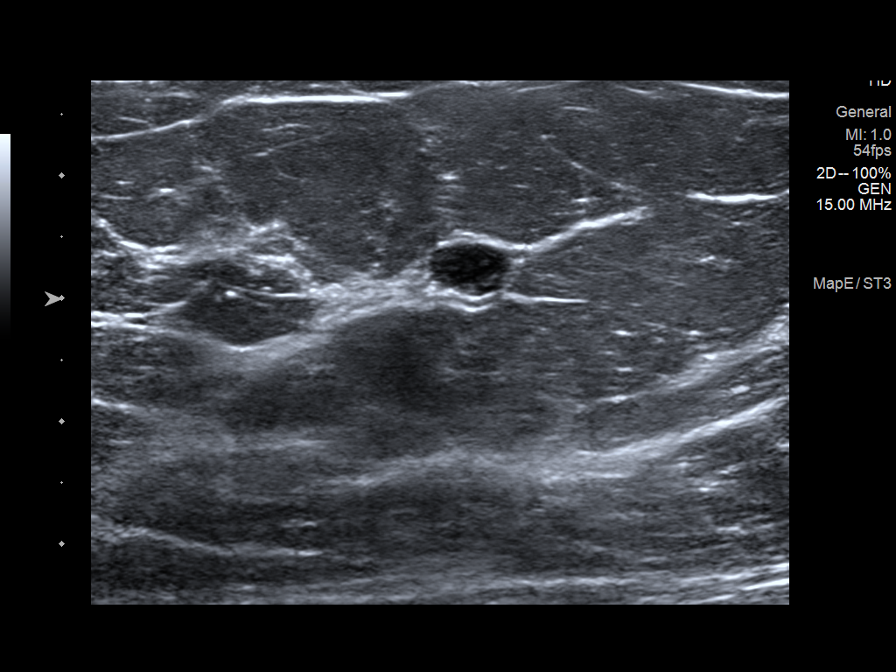
[im 3/8]
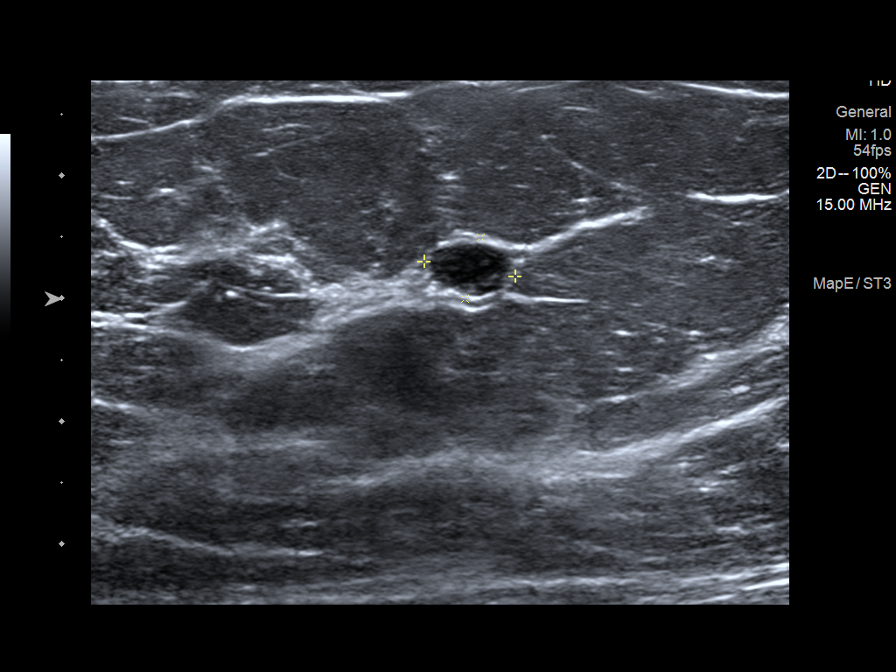
[im 4/8]
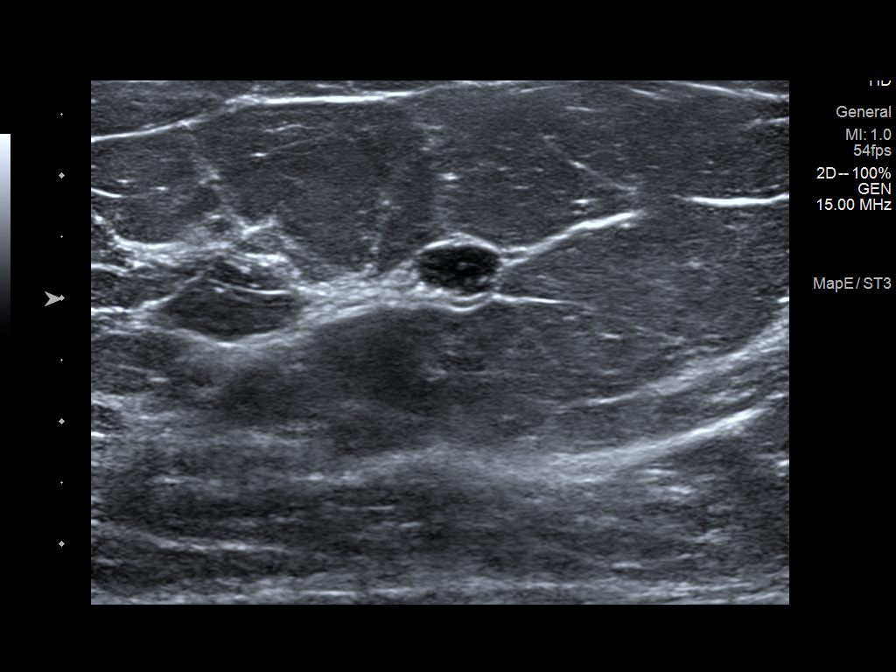
[im 5/8]
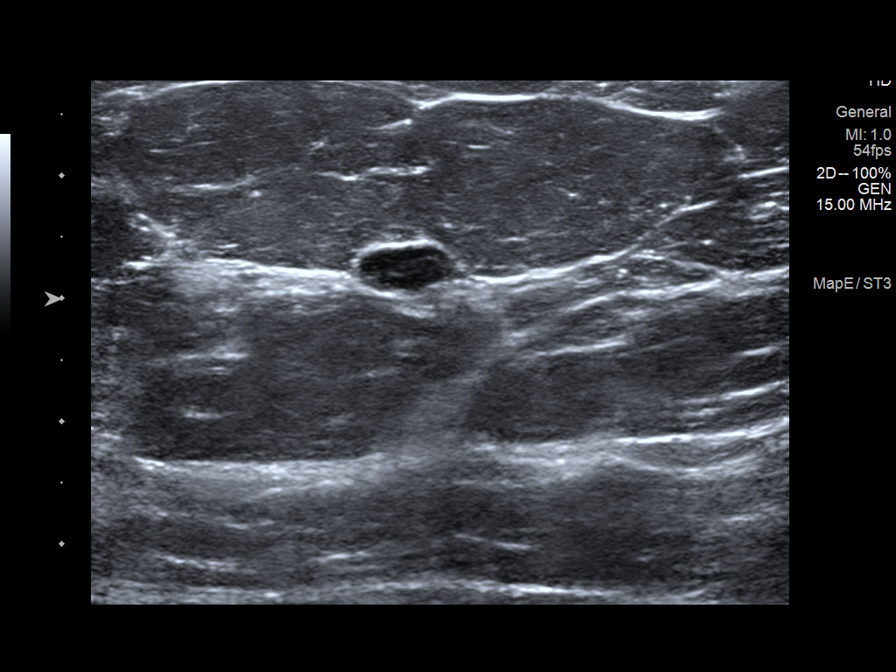
[im 6/8]
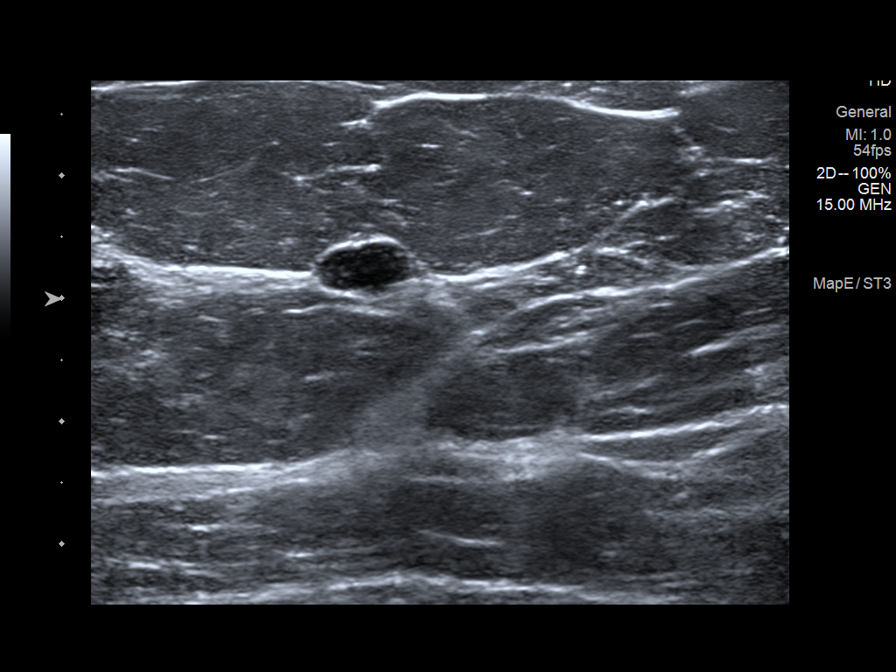
[im 7/8]
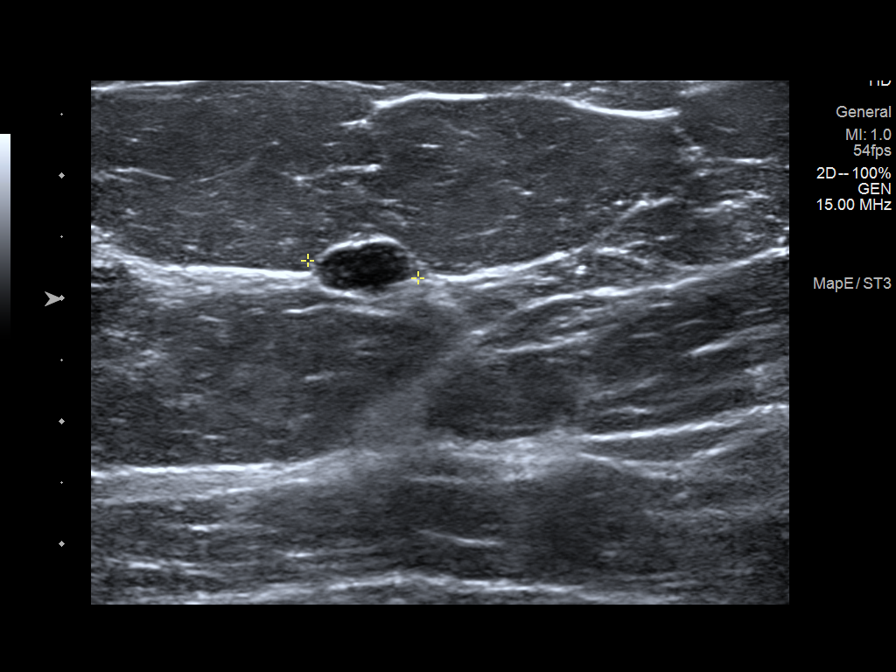
[im 8/8]
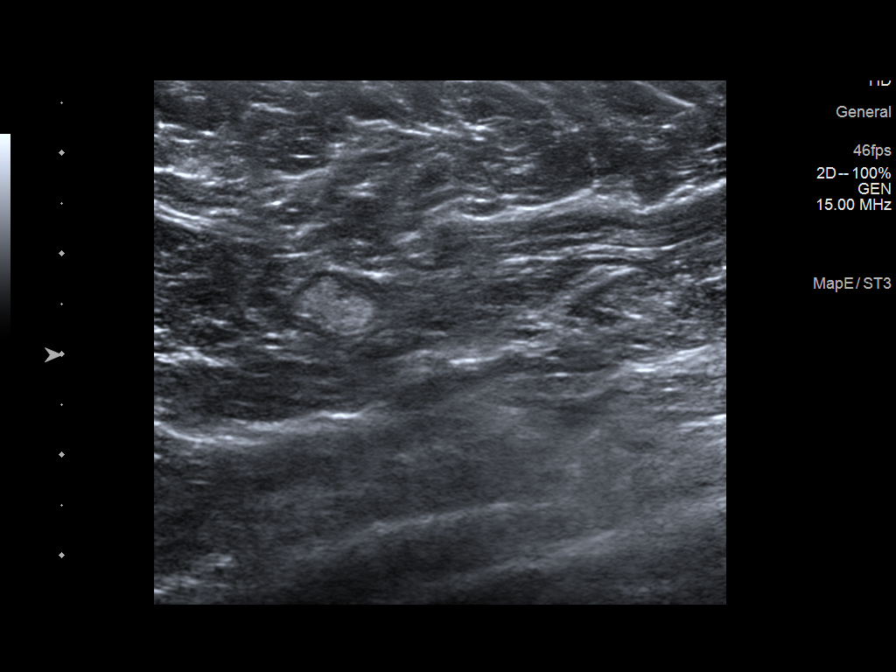

[8 of 8 positions shown; findings below may reference images not displayed]

ACR Breast Density Category b: There are scattered areas of
fibroglandular density.
FINDINGS: Additional views of the right breast demonstrate an oval,
circumscribed mass to be present located within the right breast at
1 o'clock position approximately 3 cm from the nipple.

On physical exam, there is no discrete palpable abnormality in the
area of mammographic concern.

Targeted ultrasound is performed, showing an oval, circumscribed
hypoechoic lesion with low-level internal echoes and possible
calcification located within the right breast at the 1 o'clock
position 3 cm from the nipple corresponding to the mammographic
finding. This measures 9 x 8 x 5 mm in size and most likely
represents a complicated cyst. I recommend ultrasound-guided right
breast cyst aspiration/ultrasound core biopsy.

Ultrasound the right axilla demonstrates normal axillary contents
and no evidence for adenopathy.
IMPRESSION: 9 mm lesion located within the right breast 1 o'clock position 3 cm
from the nipple most likely representing a complicated cyst versus
solid mass. Right breast ultrasound-guided cyst
aspiration/ultrasound core biopsy is recommended and will be
scheduled.

RECOMMENDATION:
Right breast ultrasound-guided cyst aspiration/core biopsy.

I have discussed the findings and recommendations with the patient.
Results were also provided in writing at the conclusion of the
visit. If applicable, a reminder letter will be sent to the patient
regarding the next appointment.

BI-RADS CATEGORY  Category 4A: Low suspicion for malignancy.

## 2017-06-09 ENCOUNTER — Encounter: Payer: Self-pay | Admitting: Nurse Practitioner

## 2017-06-09 ENCOUNTER — Ambulatory Visit (INDEPENDENT_AMBULATORY_CARE_PROVIDER_SITE_OTHER): Payer: Self-pay | Admitting: Nurse Practitioner

## 2017-06-09 DIAGNOSIS — I10 Essential (primary) hypertension: Secondary | ICD-10-CM

## 2017-06-09 MED ORDER — LISINOPRIL 10 MG PO TABS: 10.0000 mg | ORAL_TABLET | Freq: Every day | ORAL | 1 refills | Status: DC

## 2017-06-09 NOTE — Patient Instructions (Addendum)
Terri Gamble, Thank you for coming in to clinic today.  1. For your anxiety - Focus on stress management.  You may find mindfulness strategies to be helpful.  2. For your blood pressure: - Start checking BP outside of the clinic about 1 time per week. - START lisinopril 10 mg once daily.  - Recheck BMP in 2 weeks at [facility] after starting this med.  Please schedule a follow-up appointment with Cassell Smiles, AGNP. Return in about 4 weeks (around 07/07/2017) for hypertension.  If you have any other questions or concerns, please feel free to call the clinic or send a message through [place]. You may also schedule an earlier appointment if necessary.  You will receive a survey after today's visit either digitally by e-mail or paper by [employer]. Your experiences and feedback matter to [place].  Please respond so we know how we are doing as we provide care for you.   Cassell Smiles, DNP, AGNP-BC Adult Gerontology Nurse Practitioner [place]    Hypertension Hypertension, commonly called high blood pressure, is when the force of blood pumping through the arteries is too strong. The arteries are the blood vessels that carry blood from the heart throughout the body. Hypertension forces the heart to work harder to pump blood and may cause arteries to become narrow or stiff. Having untreated or uncontrolled hypertension can cause heart attacks, strokes, kidney disease, and other problems. A blood pressure reading consists of a higher number over a lower number. Ideally, your blood pressure should be below 120/80. The first ("top") number is called the systolic pressure. It is a measure of the pressure in your arteries as your heart beats. The second ("bottom") number is called the diastolic pressure. It is a measure of the pressure in your arteries as the heart relaxes. What are the causes? The cause of this condition is not known. What increases the risk? Some risk factors for  high blood pressure are under your control. Others are not. Factors you can change  Smoking.  Having type 2 diabetes mellitus, high cholesterol, or both.  Not getting enough exercise or physical activity.  Being overweight.  Having too much fat, sugar, calories, or salt (sodium) in your diet.  Drinking too much alcohol. Factors that are difficult or impossible to change  Having chronic kidney disease.  Having a family history of high blood pressure.  Age. Risk increases with age.  Race. You may be at higher risk if you are African-American.  Gender. Men are at higher risk than women before age 27. After age 15, women are at higher risk than men.  Having obstructive sleep apnea.  Stress. What are the signs or symptoms? Extremely high blood pressure (hypertensive crisis) may cause:  Headache.  Anxiety.  Shortness of breath.  Nosebleed.  Nausea and vomiting.  Severe chest pain.  Jerky movements you cannot control (seizures).  How is this diagnosed? This condition is diagnosed by measuring your blood pressure while you are seated, with your arm resting on a surface. The cuff of the blood pressure monitor will be placed directly against the skin of your upper arm at the level of your heart. It should be measured at least twice using the same arm. Certain conditions can cause a difference in blood pressure between your right and left arms. Certain factors can cause blood pressure readings to be lower or higher than normal (elevated) for a short period of time:  When your blood pressure is higher when  you are in a health care provider's office than when you are at home, this is called white coat hypertension. Most people with this condition do not need medicines.  When your blood pressure is higher at home than when you are in a health care provider's office, this is called masked hypertension. Most people with this condition may need medicines to control blood  pressure.  If you have a high blood pressure reading during one visit or you have normal blood pressure with other risk factors:  You may be asked to return on a different day to have your blood pressure checked again.  You may be asked to monitor your blood pressure at home for 1 week or longer.  If you are diagnosed with hypertension, you may have other blood or imaging tests to help your health care provider understand your overall risk for other conditions. How is this treated? This condition is treated by making healthy lifestyle changes, such as eating healthy foods, exercising more, and reducing your alcohol intake. Your health care provider may prescribe medicine if lifestyle changes are not enough to get your blood pressure under control, and if:  Your systolic blood pressure is above 130.  Your diastolic blood pressure is above 80.  Your personal target blood pressure may vary depending on your medical conditions, your age, and other factors. Follow these instructions at home: Eating and drinking  Eat a diet that is high in fiber and potassium, and low in sodium, added sugar, and fat. An example eating plan is called the DASH (Dietary Approaches to Stop Hypertension) diet. To eat this way: ? Eat plenty of fresh fruits and vegetables. Try to fill half of your plate at each meal with fruits and vegetables. ? Eat whole grains, such as whole wheat pasta, brown rice, or whole grain bread. Fill about one quarter of your plate with whole grains. ? Eat or drink low-fat dairy products, such as skim milk or low-fat yogurt. ? Avoid fatty cuts of meat, processed or cured meats, and poultry with skin. Fill about one quarter of your plate with lean proteins, such as fish, chicken without skin, beans, eggs, and tofu. ? Avoid premade and processed foods. These tend to be higher in sodium, added sugar, and fat.  Reduce your daily sodium intake. Most people with hypertension should eat less than  1,500 mg of sodium a day.  Limit alcohol intake to no more than 1 drink a day for nonpregnant women and 2 drinks a day for men. One drink equals 12 oz of beer, 5 oz of wine, or 1 oz of hard liquor. Lifestyle  Work with your health care provider to maintain a healthy body weight or to lose weight. Ask what an ideal weight is for you.  Get at least 30 minutes of exercise that causes your heart to beat faster (aerobic exercise) most days of the week. Activities may include walking, swimming, or biking.  Include exercise to strengthen your muscles (resistance exercise), such as pilates or lifting weights, as part of your weekly exercise routine. Try to do these types of exercises for 30 minutes at least 3 days a week.  Do not use any products that contain nicotine or tobacco, such as cigarettes and e-cigarettes. If you need help quitting, ask your health care provider.  Monitor your blood pressure at home as told by your health care provider.  Keep all follow-up visits as told by your health care provider. This is important. Medicines  Take  over-the-counter and prescription medicines only as told by your health care provider. Follow directions carefully. Blood pressure medicines must be taken as prescribed.  Do not skip doses of blood pressure medicine. Doing this puts you at risk for problems and can make the medicine less effective.  Ask your health care provider about side effects or reactions to medicines that you should watch for. Contact a health care provider if:  You think you are having a reaction to a medicine you are taking.  You have headaches that keep coming back (recurring).  You feel dizzy.  You have swelling in your ankles.  You have trouble with your vision. Get help right away if:  You develop a severe headache or confusion.  You have unusual weakness or numbness.  You feel faint.  You have severe pain in your chest or abdomen.  You vomit repeatedly.  You  have trouble breathing. Summary  Hypertension is when the force of blood pumping through your arteries is too strong. If this condition is not controlled, it may put you at risk for serious complications.  Your personal target blood pressure may vary depending on your medical conditions, your age, and other factors. For most people, a normal blood pressure is less than 120/80.  Hypertension is treated with lifestyle changes, medicines, or a combination of both. Lifestyle changes include weight loss, eating a healthy, low-sodium diet, exercising more, and limiting alcohol. This information is not intended to replace advice given to you by your health care provider. Make sure you discuss any questions you have with your health care provider. Document Released: 08/25/2005 Document Revised: 07/23/2016 Document Reviewed: 07/23/2016 Elsevier Interactive Patient Education  [employer].

## 2017-06-09 NOTE — Progress Notes (Signed)
Subjective:    Patient ID: Terri Gamble, female    DOB: Nov 03, 1960, 56 y.o.   MRN: [id number]  Terri Gamble is a 56 y.o. female presenting on 06/09/2017 for Establish Care (borderline hypertension x several yrs. 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  HPI Establish Care New Provider Pt last seen by PCP many (20+) years ago.  Has rarely needed care and sought care in Urgent Care.  OBGYN Dr. Urban Gibson OB Last visit about 2 weeks ago.  Hypertension - She is not checking BP at home or outside of clinic.    - Current medications: none - Started amlodipine per GYN prior to surgery after BP reading of 140/99 at dentist w/ automatic machine.  Med "made her feel funny," so she stopped " after 1 week.  Notes DBP was elevated higher when on this med. - She is not currently symptomatic. - Pt denies headache, lightheadedness, dizziness, changes in vision, chest tightness/pressure, palpitations, leg swelling, sudden loss of speech or loss of consciousness. - She  reports no regular exercise routine. - Her diet is moderate in salt, moderate in fat, and moderate in carbohydrates. Fast food 2x per week.  Other meals out: 1x per week  Pt will be cleared for outpatient gyn surgery as long as her BP control is improved.  Depression screen [facility] 2/9 06/09/2017 06/09/2017 06/09/2017  Decreased Interest 1 1 0  Down, Depressed, Hopeless 0 0 0  PHQ - 2 Score 1 1 0  Altered sleeping 2 - -  Tired, decreased energy 1 - -  Change in appetite 2 - -  Feeling bad or failure about yourself  0 - -  Trouble concentrating 0 - -  Moving slowly or fidgety/restless 0 - -  Suicidal thoughts 0 - -  PHQ-9 Score 6 - -  Difficult doing work/chores Somewhat difficult - -   GAD 7 : Generalized Anxiety Score 06/09/2017    Nervous, Anxious, on Edge 2  Control/stop worrying 0  Worry too much - different things 0  Trouble relaxing 2  Restless 1  Easily annoyed or irritable 2  Afraid - awful might happen 0  Total GAD 7 Score 7  Anxiety Difficulty Somewhat difficult    Past Medical History:  Diagnosis Date  . Seasonal allergies    Past Surgical History:  Procedure Laterality Date  . ABLATION  2006  . [place]   Social History   Social History  . Marital status: Divorced    Spouse name: N/A  . Number of children: N/A  . Years of education: N/A   Occupational History  . Not on file.   Social History Main Topics  . Smoking status: Former Smoker    Quit date: 09/08/2004  . Smokeless tobacco: Never Used  . Alcohol use 0.0 oz/week     Comment: 1 drink a month  . Drug use: No  . Sexual activity: Yes   Other Topics Concern  . Not on file   Social History Narrative  . No narrative on file   Family History  Problem Relation Age of Onset  . Heart disease Mother   . Kidney disease Mother   . Hypertension Mother   . Diabetes Mother   . Diabetes  Maternal Grandmother   . Hypertension Maternal Grandmother   . Heart disease Maternal Grandmother   . Stomach cancer Paternal Uncle        x 2  . Lung cancer Paternal Uncle   . Lung cancer Maternal Uncle   . Heart disease Maternal Grandfather   . Heart disease Paternal Grandmother   . Stroke Paternal Grandmother   . Alzheimer's disease Paternal Grandfather   . Colon cancer Neg Hx   . Pancreatic cancer Neg Hx   . Esophageal cancer Neg Hx   . Liver disease Neg Hx   . Ovarian cancer Neg Hx   . Breast cancer Neg Hx    Current Outpatient Prescriptions on File Prior to Visit  Medication Sig  . Multiple Vitamins-Minerals (MULTIVITAMIN WOMEN PO) Take 1 tablet by mouth daily.  . valACYclovir (VALTREX) 500 MG tablet Take 500 mg by mouth 2 (two) times daily as needed (for fever blisters).   No current facility-administered  medications on file prior to visit.     Review of Systems Per HPI unless specifically indicated above     Objective:    BP (!) 153/76 (BP Location: Right Arm, Patient Position: Sitting, Cuff Size: Large)   Pulse (!) 102   Temp 98 F (36.7 C) (Oral)   Ht 5\' 1"  (1.549 m)   Wt 258 lb 12.8 oz (117.4 kg)   BMI 48.90 kg/m   Wt Readings from Last 3 Encounters:  06/09/17 258 lb 12.8 oz (117.4 kg)  05/19/17 260 lb 12.8 oz (118.3 kg)  02/18/17 255 lb (115.7 kg)    Physical Exam  General - morbid obesity, well-appearing, NAD HEENT - Normocephalic, atraumatic Neck - supple, non-tender, no LAD, no thyromegaly, no carotid bruit Heart - RRR, no murmurs heard Lungs - Clear throughout all lobes, no wheezing, crackles, or rhonchi. Normal work of breathing. Extremeties - non-tender, no edema, cap refill < 2 seconds, peripheral pulses intact +2 bilaterally Skin - warm, dry Neuro - awake, alert, oriented x3, normal gait Psych - anxious mood and affect, normal behavior   Results for orders placed or performed in visit on 05/19/17  CBC with Differential/Platelet  Result Value Ref Range   WBC 9.6 4.0 - 10.5 K/uL   RBC 4.83 3.87 - 5.11 Mil/uL   Hemoglobin 14.1 12.0 - 15.0 g/dL   HCT 41.7 36.0 - 46.0 %   MCV 86.4 78.0 - 100.0 fl   MCHC 33.8 30.0 - 36.0 g/dL   RDW 13.7 11.5 - 15.5 %   Platelets 439.0 (H) 150.0 - 400.0 K/uL   Neutrophils Relative % 65.8 43.0 - 77.0 %   Lymphocytes Relative 24.4 12.0 - 46.0 %   Monocytes Relative 6.8 3.0 - 12.0 %   Eosinophils Relative 1.9 0.0 - 5.0 %   Basophils Relative 1.1 0.0 - 3.0 %   Neutro Abs 6.3 1.4 - 7.7 K/uL   Lymphs Abs 2.3 0.7 - 4.0 K/uL   Monocytes Absolute 0.7 0.1 - 1.0 K/uL   Eosinophils Absolute 0.2 0.0 - 0.7 K/uL   Basophils Absolute 0.1 0.0 - 0.1 K/uL  Comprehensive metabolic panel  Result Value Ref Range   Sodium 137 135 - 145 mEq/L   Potassium 3.8 3.5 - 5.1 mEq/L   Chloride 101 96 - 112 mEq/L   CO2 26 19 - 32 mEq/L   Glucose, Bld  89 70 - 99 mg/dL   BUN 20 6 - 23 mg/dL   Creatinine, Ser 0.83 0.40 - 1.20 mg/dL  Total Bilirubin 0.2 0.2 - 1.2 mg/dL   Alkaline Phosphatase 115 39 - 117 U/L   AST 17 0 - 37 U/L   ALT 19 0 - 35 U/L   Total Protein 7.2 6.0 - 8.3 g/dL   Albumin 4.1 3.5 - 5.2 g/dL   Calcium 9.8 8.4 - 10.5 mg/dL   GFR 75.02 >60.00 mL/min  Urinalysis, Routine w reflex microscopic  Result Value Ref Range   Color, Urine YELLOW Yellow;Lt. Yellow   APPearance CLEAR Clear   Specific Gravity, Urine >=1.030 (A) 1.000 - 1.030   pH 5.5 5.0 - 8.0   Total Protein, Urine NEGATIVE Negative   Urine Glucose NEGATIVE Negative   Ketones, ur NEGATIVE Negative   Bilirubin Urine NEGATIVE Negative   Hgb urine dipstick SMALL (A) Negative   Urobilinogen, UA 0.2 0.0 - 1.0   Leukocytes, UA NEGATIVE Negative   Nitrite NEGATIVE Negative   WBC, UA 0-2/hpf 0-2/hpf   RBC / HPF 0-2/hpf 0-2/hpf   Mucus, UA Presence of (A) None   Squamous Epithelial / LPF Rare(0-4/hpf) Rare(0-4/hpf)  Amylase  Result Value Ref Range   Amylase 55 27 - 131 U/L  Lipase  Result Value Ref Range   Lipase 15.0 11.0 - 59.0 U/L      Assessment & Plan:   Problem List Items Addressed This Visit      Cardiovascular and Mediastinum   Hypertension    Newly diagnosed and currently uncontrolled.  Pt has not tolerated amlodipine w/o any specific side effects, but stopped medication.  Currently asymptomatic and without complications.  Plan: 1. Start lisinopril 10 mg once daily. Reviewed common side effect of cough and more rare side effects of kidney damage and angioedema. - Prior kidney function normal.  Check BMP in 2 weeks after starting med. 2. Discussed lifestyle changes and weight loss.   - Encouraged DASH diet.  - Increase physical activity to 30 minutes most days of the week. 3. Check BP at home.  Reviewed goal < 130/80.  4. Reviewed s/sx of CVA, htn crisis.  Seek care via EMS if occur. 5. Follow up approx 4 weeks in clinic before surgery.        Relevant Medications   lisinopril (PRINIVIL,ZESTRIL) 10 MG tablet   Other Relevant Orders   Basic Metabolic Panel (BMET)      Meds ordered this encounter  Medications  . valACYclovir (VALTREX) 500 MG tablet    Sig: valacyclovir 500 mg tablet  . DISCONTD: amLODipine (NORVASC) 5 MG tablet    Sig: Take 5 mg by mouth daily.    Refill:  2  . lisinopril (PRINIVIL,ZESTRIL) 10 MG tablet    Sig: Take 1 tablet (10 mg total) by mouth daily.    Dispense:  90 tablet    Refill:  1    Order Specific Question:   Supervising Provider    Answer:   Olin Hauser [[id number]]      Follow up plan: Return in about 4 weeks (around 07/07/2017) for hypertension.  Cassell Smiles, DNP, AGPCNP-BC Adult Gerontology Primary Care Nurse Practitioner [facility] 06/19/2017, 10:46 AM

## 2017-06-18 NOTE — Patient Instructions (Addendum)
Your procedure is scheduled on:  Friday, Oct. 26 at 8:45 am  Enter through the [employer] of [facility] at: 7:15 am  Pick up the phone at the desk and dial [phone].  Call this number if you have problems the morning of surgery: [phone].  Remember: Do NOT eat food or drink clear liquids (including water) after midnight Thursday, 10/25  Take these medicines the morning of surgery with a SIP OF WATER: None  Do NOT wear jewelry (body piercing), metal hair clips/bobby pins, make-up, or nail polish. Do NOT wear lotions, powders, or perfumes.  You may wear deoderant. Do NOT shave for 48 hours prior to surgery. Do NOT bring valuables to the hospital.  Have a responsible adult drive you home and stay with you for 24 hours after your procedure.  Home with Terri Gamble cell [phone].

## 2017-06-19 NOTE — Assessment & Plan Note (Addendum)
Newly diagnosed and currently uncontrolled.  Pt has not tolerated amlodipine w/o any specific side effects, but stopped medication.  Currently asymptomatic and without complications.  Plan: 1. Start lisinopril 10 mg once daily. Reviewed common side effect of cough and more rare side effects of kidney damage and angioedema. - Prior kidney function normal.  Check BMP in 2 weeks after starting med. 2. Discussed lifestyle changes and weight loss.   - Encouraged DASH diet.  - Increase physical activity to 30 minutes most days of the week. 3. Check BP at home.  Reviewed goal < 130/80.  4. Reviewed s/sx of CVA, htn crisis.  Seek care via EMS if occur. 5. Follow up approx 4 weeks in clinic before surgery.

## 2017-06-22 ENCOUNTER — Encounter (HOSPITAL_COMMUNITY): Payer: Self-pay

## 2017-06-22 ENCOUNTER — Encounter (HOSPITAL_COMMUNITY)
Admission: RE | Admit: 2017-06-22 | Discharge: 2017-06-22 | Disposition: A | Discharge status: Home / Self Care | Payer: 59 | Source: Ambulatory Visit | Attending: Obstetrics and Gynecology | Admitting: Obstetrics and Gynecology

## 2017-06-22 DIAGNOSIS — Z833 Family history of diabetes mellitus: Secondary | ICD-10-CM | POA: Diagnosis not present

## 2017-06-22 DIAGNOSIS — Z01812 Encounter for preprocedural laboratory examination: Secondary | ICD-10-CM | POA: Insufficient documentation

## 2017-06-22 DIAGNOSIS — I10 Essential (primary) hypertension: Secondary | ICD-10-CM | POA: Diagnosis not present

## 2017-06-22 DIAGNOSIS — Z87891 Personal history of nicotine dependence: Secondary | ICD-10-CM | POA: Diagnosis not present

## 2017-06-22 DIAGNOSIS — Z8249 Family history of ischemic heart disease and other diseases of the circulatory system: Secondary | ICD-10-CM | POA: Insufficient documentation

## 2017-06-22 HISTORY — DX: Essential (primary) hypertension: I10

## 2017-06-22 LAB — TYPE AND SCREEN
ABO/RH(D): O POS
ANTIBODY SCREEN: NEGATIVE

## 2017-06-22 LAB — ABO/RH: ABO/RH(D): O POS

## 2017-06-22 NOTE — Pre-Procedure Instructions (Signed)
Patient is having her CBC and BMP drawn at Dr. Nena Alexander office which uses [employer].  Patient works at [employer] and is not charged for labs.  Spoke with Dr. Nena Alexander office, patient has PAT appt this afternoon with Dr. Marvel Plan at 3:15 pm, labs will be drawn at PAT appt.  Requested lab results to be faxed to [place] at [phone].  Orders for CBC and BMP in EPIC.  T & S was drawn today at [facility].

## 2017-07-02 NOTE — H&P (Signed)
Terri Gamble is an 56 y.o. female G1P1 who presents for a scheduled hysteroscopy/polypectomy for a reported episode of bleeding 5/18 when she came for her annual exam in 8/18, was more like a light full-on cycle for 4 days. She had one episode like this the previous year as well.  She has a h/o menopause in 2009 with known fibroids. 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  A SIUS in the last month demonstrated 3 apparent polyps in the cavity.    Pertinent Gynecological History: Menses:PMP  OB History: C/S x 1   Menstrual History:  No LMP recorded. Patient is postmenopausal.    Past Medical History:  Diagnosis Date  . Hypertension   . Seasonal allergies     Past Surg History C-section Colonoscopy Hysteroscopy (no ablation) 2005  Family History  Problem Relation Age of Onset  . Heart disease Mother   . Kidney disease Mother   . Hypertension Mother   . Diabetes Mother   . Diabetes Maternal Grandmother   . Hypertension Maternal Grandmother   . Heart disease Maternal Grandmother   . Stomach cancer Paternal Uncle        x 2  . Lung cancer Paternal Uncle   . Lung cancer Maternal Uncle   . Heart disease Maternal Grandfather   . Heart disease Paternal Grandmother   . Stroke Paternal Grandmother   . Alzheimer's disease Paternal Grandfather   . Colon cancer Neg Hx   . Pancreatic cancer Neg Hx   . Esophageal cancer Neg Hx   . Liver disease Neg Hx   . Ovarian cancer Neg Hx   . Breast cancer Neg Hx     Social History:  reports that she quit smoking about 12 years ago. Her smoking use included Cigarettes. She has a 7.50 pack-year smoking history. She has never used smokeless tobacco. She reports that she drinks alcohol. She reports that she does not use drugs.  Allergies:  Allergies  Allergen Reactions  . Codeine Nausea Only  . Morphine And Related Other (See Comments)   Headache  . Latex Rash    No prescriptions prior to admission.    Review of Systems  Gastrointestinal: Negative for abdominal pain.  Neurological: Negative for headaches.    There were no vitals taken for this visit. Physical Exam  Constitutional: She appears well-developed and well-nourished.  Cardiovascular: Normal rate and regular rhythm.   Respiratory: Effort normal.  GI: Soft.  Genitourinary: Vagina normal.  Genitourinary Comments: Uterus slightly enlarged and retroverted  Musculoskeletal: Normal range of motion.  Neurological: She is alert.  Psychiatric: She has a normal mood and affect.    No results found for this or any previous visit (from the past 24 hour(s)).  No results found.  Assessment/Plan: The patient was counseled regarding the hysteroscopy procedure in detail. We reviewed risks of bleeding and infection and possible uterine perforation. We also discussed the removal of any identified polyps or submucosal fibroids and what that would involve with the Myosure device. The patient desires to proceed. She will use cytotec 476mcg 3 hours prior to the procedure to aid with cervical dilation.  Logan Bores 07/02/2017, 8:31 PM

## 2017-07-03 ENCOUNTER — Encounter (HOSPITAL_COMMUNITY)
Admission: RE | Disposition: A | Discharge status: Home / Self Care | Payer: Self-pay | Source: Ambulatory Visit | Attending: Obstetrics and Gynecology

## 2017-07-03 ENCOUNTER — Ambulatory Visit (HOSPITAL_COMMUNITY): Payer: 59 | Admitting: Anesthesiology

## 2017-07-03 ENCOUNTER — Encounter (HOSPITAL_COMMUNITY): Payer: Self-pay

## 2017-07-03 ENCOUNTER — Ambulatory Visit (HOSPITAL_COMMUNITY)
Admission: RE | Admit: 2017-07-03 | Discharge: 2017-07-03 | Disposition: A | Discharge status: Home / Self Care | Payer: 59 | Source: Ambulatory Visit | Attending: Obstetrics and Gynecology | Admitting: Obstetrics and Gynecology

## 2017-07-03 DIAGNOSIS — Z885 Allergy status to narcotic agent status: Secondary | ICD-10-CM | POA: Diagnosis not present

## 2017-07-03 DIAGNOSIS — N950 Postmenopausal bleeding: Secondary | ICD-10-CM | POA: Diagnosis present

## 2017-07-03 DIAGNOSIS — I10 Essential (primary) hypertension: Secondary | ICD-10-CM | POA: Diagnosis not present

## 2017-07-03 DIAGNOSIS — N920 Excessive and frequent menstruation with regular cycle: Secondary | ICD-10-CM | POA: Insufficient documentation

## 2017-07-03 DIAGNOSIS — Z91040 Latex allergy status: Secondary | ICD-10-CM | POA: Insufficient documentation

## 2017-07-03 DIAGNOSIS — D250 Submucous leiomyoma of uterus: Secondary | ICD-10-CM | POA: Insufficient documentation

## 2017-07-03 DIAGNOSIS — N840 Polyp of corpus uteri: Secondary | ICD-10-CM | POA: Diagnosis not present

## 2017-07-03 DIAGNOSIS — Z87891 Personal history of nicotine dependence: Secondary | ICD-10-CM | POA: Diagnosis not present

## 2017-07-03 HISTORY — PX: DILATATION & CURETTAGE/HYSTEROSCOPY WITH MYOSURE: SHX6511

## 2017-07-03 SURGERY — DILATATION & CURETTAGE/HYSTEROSCOPY WITH MYOSURE
Anesthesia: General

## 2017-07-03 MED ORDER — PHENYLEPHRINE HCL 10 MG/ML IJ SOLN
INTRAMUSCULAR | Status: DC | PRN
  Administered 2017-07-03 (×4): 40 ug via INTRAVENOUS

## 2017-07-03 MED ORDER — PROPOFOL 10 MG/ML IV BOLUS
INTRAVENOUS | Status: DC | PRN
  Administered 2017-07-03: 250 mg via INTRAVENOUS

## 2017-07-03 MED ORDER — ACETAMINOPHEN 325 MG PO TABS: 325.0000 mg | ORAL_TABLET | ORAL | 0 refills | Status: DC | PRN

## 2017-07-03 MED ORDER — PHENYLEPHRINE 40 MCG/ML (10ML) SYRINGE FOR IV PUSH (FOR BLOOD PRESSURE SUPPORT)
PREFILLED_SYRINGE | INTRAVENOUS | Status: AC
  Filled 2017-07-03: qty 10

## 2017-07-03 MED ORDER — OXYCODONE HCL 5 MG PO TABS
5.0000 mg | ORAL_TABLET | Freq: Once | ORAL | Status: AC | PRN
  Administered 2017-07-03: 5 mg via ORAL

## 2017-07-03 MED ORDER — DEXAMETHASONE SODIUM PHOSPHATE 4 MG/ML IJ SOLN
INTRAMUSCULAR | Status: AC
  Filled 2017-07-03: qty 1

## 2017-07-03 MED ORDER — PROPOFOL 10 MG/ML IV BOLUS
INTRAVENOUS | Status: AC
  Filled 2017-07-03: qty 20

## 2017-07-03 MED ORDER — ACETAMINOPHEN 325 MG PO TABS: 325.0000 mg | ORAL_TABLET | ORAL | Status: DC | PRN

## 2017-07-03 MED ORDER — MIDAZOLAM HCL 2 MG/2ML IJ SOLN
INTRAMUSCULAR | Status: AC
  Filled 2017-07-03: qty 2

## 2017-07-03 MED ORDER — SCOPOLAMINE 1 MG/3DAYS TD PT72
MEDICATED_PATCH | TRANSDERMAL | Status: DC
Start: 2017-07-03 — End: 2017-07-03
  Administered 2017-07-03: 1.5 mg via TRANSDERMAL
  Filled 2017-07-03: qty 1

## 2017-07-03 MED ORDER — ONDANSETRON HCL 4 MG/2ML IJ SOLN
INTRAMUSCULAR | Status: AC
  Filled 2017-07-03: qty 2

## 2017-07-03 MED ORDER — EPHEDRINE SULFATE 50 MG/ML IJ SOLN
INTRAMUSCULAR | Status: DC | PRN
  Administered 2017-07-03: 5 mg via INTRAVENOUS

## 2017-07-03 MED ORDER — EPHEDRINE 5 MG/ML INJ
INTRAVENOUS | Status: AC
  Filled 2017-07-03: qty 10

## 2017-07-03 MED ORDER — OXYCODONE HCL 5 MG PO TABS
ORAL_TABLET | ORAL | Status: AC
  Filled 2017-07-03: qty 1

## 2017-07-03 MED ORDER — MIDAZOLAM HCL 2 MG/2ML IJ SOLN
INTRAMUSCULAR | Status: DC | PRN
Start: 2017-07-03 — End: 2017-07-03
  Administered 2017-07-03: 2 mg via INTRAVENOUS

## 2017-07-03 MED ORDER — KETOROLAC TROMETHAMINE 30 MG/ML IJ SOLN
INTRAMUSCULAR | Status: DC | PRN
  Administered 2017-07-03: 30 mg via INTRAVENOUS

## 2017-07-03 MED ORDER — KETOROLAC TROMETHAMINE 30 MG/ML IJ SOLN
INTRAMUSCULAR | Status: AC
  Filled 2017-07-03: qty 1

## 2017-07-03 MED ORDER — LIDOCAINE HCL (CARDIAC) 20 MG/ML IV SOLN
INTRAVENOUS | Status: DC | PRN
  Administered 2017-07-03: 80 mg via INTRAVENOUS

## 2017-07-03 MED ORDER — LACTATED RINGERS IV SOLN: INTRAVENOUS | Status: DC

## 2017-07-03 MED ORDER — ONDANSETRON HCL 4 MG/2ML IJ SOLN
INTRAMUSCULAR | Status: DC | PRN
  Administered 2017-07-03: 4 mg via INTRAVENOUS

## 2017-07-03 MED ORDER — FENTANYL CITRATE (PF) 100 MCG/2ML IJ SOLN: 25.0000 ug | INTRAMUSCULAR | Status: DC | PRN

## 2017-07-03 MED ORDER — LIDOCAINE HCL 1 % IJ SOLN
INTRAMUSCULAR | Status: AC
  Filled 2017-07-03: qty 1

## 2017-07-03 MED ORDER — ACETAMINOPHEN 160 MG/5ML PO SOLN: 325.0000 mg | ORAL | Status: DC | PRN

## 2017-07-03 MED ORDER — DEXAMETHASONE SODIUM PHOSPHATE 10 MG/ML IJ SOLN
INTRAMUSCULAR | Status: DC | PRN
  Administered 2017-07-03: 4 mg via INTRAVENOUS

## 2017-07-03 MED ORDER — SCOPOLAMINE 1 MG/3DAYS TD PT72
1.0000 | MEDICATED_PATCH | Freq: Once | TRANSDERMAL | Status: DC
  Administered 2017-07-03: 1.5 mg via TRANSDERMAL

## 2017-07-03 MED ORDER — OXYCODONE HCL 5 MG/5ML PO SOLN: 5.0000 mg | Freq: Once | ORAL | Status: AC | PRN

## 2017-07-03 MED ORDER — LIDOCAINE HCL (CARDIAC) 20 MG/ML IV SOLN
INTRAVENOUS | Status: AC
  Filled 2017-07-03: qty 5

## 2017-07-03 MED ORDER — LACTATED RINGERS IV SOLN
INTRAVENOUS | Status: DC
  Administered 2017-07-03: 125 mL/h via INTRAVENOUS
  Administered 2017-07-03: 09:00:00 via INTRAVENOUS

## 2017-07-03 MED ORDER — FENTANYL CITRATE (PF) 100 MCG/2ML IJ SOLN
INTRAMUSCULAR | Status: DC | PRN
  Administered 2017-07-03: 75 ug via INTRAVENOUS
  Administered 2017-07-03: 25 ug via INTRAVENOUS

## 2017-07-03 MED ORDER — FENTANYL CITRATE (PF) 100 MCG/2ML IJ SOLN
INTRAMUSCULAR | Status: AC
  Filled 2017-07-03: qty 2

## 2017-07-03 MED ORDER — LIDOCAINE HCL 1 % IJ SOLN
INTRAMUSCULAR | Status: DC | PRN
Start: 2017-07-03 — End: 2017-07-03
  Administered 2017-07-03: 20 mL

## 2017-07-03 SURGICAL SUPPLY — 17 items
CANISTER SUCT 3000ML PPV (MISCELLANEOUS) ×3 IMPLANT
CATH FOLEY LATEX FREE 14FR (CATHETERS) ×3
CATH FOLEY LF 14FR (CATHETERS) IMPLANT
CONTAINER PREFILL 10% NBF 60ML (FORM) ×6 IMPLANT
DEVICE MYOSURE LITE (MISCELLANEOUS) IMPLANT
DEVICE MYOSURE REACH (MISCELLANEOUS) IMPLANT
FILTER ARTHROSCOPY CONVERTOR (FILTER) ×3 IMPLANT
GLOVE BIOGEL PI IND STRL 7.0 (GLOVE) ×1 IMPLANT
GLOVE BIOGEL PI INDICATOR 7.0 (GLOVE) ×6
GLOVE NEODERM STER SZ 7 (GLOVE) ×4 IMPLANT
GOWN STRL REUS W/TWL LRG LVL3 (GOWN DISPOSABLE) ×6 IMPLANT
PACK VAGINAL MINOR WOMEN LF (CUSTOM PROCEDURE TRAY) ×3 IMPLANT
PAD OB MATERNITY 4.3X12.25 (PERSONAL CARE ITEMS) ×3 IMPLANT
SEAL ROD LENS SCOPE MYOSURE (ABLATOR) ×3 IMPLANT
TOWEL OR 17X24 6PK STRL BLUE (TOWEL DISPOSABLE) ×6 IMPLANT
TUBING AQUILEX INFLOW (TUBING) ×3 IMPLANT
TUBING AQUILEX OUTFLOW (TUBING) ×3 IMPLANT

## 2017-07-03 NOTE — Anesthesia Procedure Notes (Signed)
Procedure Name: LMA Insertion Date/Time: 07/03/2017 8:45 AM Performed by: Hewitt Blade Pre-anesthesia Checklist: Patient identified, Emergency Drugs available, Suction available and Patient being monitored Patient Re-evaluated:Patient Re-evaluated prior to induction Oxygen Delivery Method: Circle system utilized Preoxygenation: Pre-oxygenation with 100% oxygen Induction Type: IV induction LMA: LMA inserted LMA Size: 4.0 Number of attempts: 1 Placement Confirmation: positive ETCO2 and breath sounds checked- equal and bilateral Tube secured with: Tape Dental Injury: Teeth and Oropharynx as per pre-operative assessment

## 2017-07-03 NOTE — Op Note (Addendum)
    Preoperative Diagnosis Postmenopausal bleeding Polyps on saline infused ultrasound Cervix stenotic at internal os  Postoperative Diagnosis Same with multiple polyps and one submucosal fibroid noted Cervix distorted and deviates to patient left at internal os  Procedure Operative hysteroscopy with Myosure resection of polyps and submucosal fibroid  Surgeon Paula Compton, MD  Anesthesia LMA  Fluids: EBL <31mL UOP 27mL straight cath prior IVF 1035mlLR Hysteroscopic deficit 262mL  Findings The cervix was a bit tortuous and the internal os deviated to patient left.  The uterine fundus appeared atrophic but there were multiple polyps present (4) and one submucosal fibroid.  The largest polyp was dark red in appearance and slightly more atypical looking.  Specimen Polyp fragments and fibroid fragments  Procedure Note Patient was taken to the operating room where LMA anesthesia was obtained without difficulty. She was then prepped and draped in the normal sterile fashion in the dorsal lithotomy position. An appropriate time out was performed. A speculum was then placed within the vagina and the anterior lip of the cervix identified and injected with approximately 2 cc of 1% plain lidocaine. An additional 9 cc each was placed at 2 and 10:00 for a paracervical block. Uterus was then sounded to approximately 3-4 cm and the Pratt dilators utilized to dilate the cervix up to approximately 18 in the external canal only as the internal canal was not easily felt.   The Myosure operating scope was then introduced into the cervix and the internal os seen to deviate to the patient left.  This was then able to be entered with direct visualization and  the cavity inspected with findings as previously noted. The Myosure lite operating blade was then introduced through the scope and under direct visualization the polyps were removed in entirety. A submucosal fibroid on the posterior fundal wall was  also removed down to the level of the surface endometrium.  There was no active bleeding at the conclusion of the removal. The operating scope was then removed.  All tissue specimens were handed off to pathology. The tenaculum was then removed from the cervix and the site rendered hemostatic with silver nitrate. Finally the speculum was removed from the vagina and the patient awakened and taken to the recovery room in good condition.

## 2017-07-03 NOTE — Progress Notes (Signed)
Patient ID: Terri Gamble, female   DOB: [birth year], 56 y.o.   MRN: [id number] Per pt no changes in dicated H&P.  Some VB and cramping with cytotec.  Ready to proceed.

## 2017-07-03 NOTE — Anesthesia Preprocedure Evaluation (Signed)
Anesthesia Evaluation  Patient identified by MRN, date of birth, ID band Patient awake    Reviewed: Allergy & Precautions, NPO status , Patient's Chart, lab work & pertinent test results  History of Anesthesia Complications Negative for: history of anesthetic complications  Airway Mallampati: II  TM Distance: >3 FB Neck ROM: Full    Dental  (+) Teeth Intact   Pulmonary neg shortness of breath, neg sleep apnea, neg COPD, neg recent URI, former smoker,    breath sounds clear to auscultation       Cardiovascular hypertension, Pt. on medications (-) angina(-) Past MI and (-) CHF  Rhythm:Regular     Neuro/Psych negative neurological ROS  negative psych ROS   GI/Hepatic negative GI ROS, Neg liver ROS,   Endo/Other  Morbid obesity  Renal/GU negative Renal ROS     Musculoskeletal negative musculoskeletal ROS (+)   Abdominal   Peds  Hematology negative hematology ROS (+)   Anesthesia Other Findings   Reproductive/Obstetrics                             Anesthesia Physical Anesthesia Plan  ASA: II  Anesthesia Plan: General   Post-op Pain Management:    Induction: Intravenous  PONV Risk Score and Plan: 3 and Ondansetron, Dexamethasone and Treatment may vary due to age or medical condition  Airway Management Planned: LMA  Additional Equipment: None  Intra-op Plan:   Post-operative Plan: Extubation in OR  Informed Consent: I have reviewed the patients History and Physical, chart, labs and discussed the procedure including the risks, benefits and alternatives for the proposed anesthesia with the patient or authorized representative who has indicated his/her understanding and acceptance.   Dental advisory given  Plan Discussed with: CRNA and Surgeon  Anesthesia Plan Comments:         Anesthesia Quick Evaluation

## 2017-07-03 NOTE — Transfer of Care (Signed)
Immediate Anesthesia Transfer of Care Note  Patient: Terri Gamble  Procedure(s) Performed: DILATATION & CURETTAGE/HYSTEROSCOPY WITH MYOSURE (N/A )  Patient Location: PACU  Anesthesia Type:General  Level of Consciousness: awake, alert  and oriented  Airway & Oxygen Therapy: Patient Spontanous Breathing and Patient connected to nasal cannula oxygen  Post-op Assessment: Report given to RN, Post -op Vital signs reviewed and stable and Patient moving all extremities  Post vital signs: Reviewed and stable  Last Vitals:  Vitals:   07/03/17 0731  BP: (!) 137/91  Pulse: 91  Resp: 18  Temp: 36.9 C  SpO2: 98%    Last Pain:  Vitals:   07/03/17 0731  TempSrc: Oral  PainSc: 1       Patients Stated Pain Goal: 3 (25/[id number])  Complications: No apparent anesthesia complications

## 2017-07-03 NOTE — Discharge Instructions (Signed)
Do not take Motrin/Advil until after 3pm today.  General Anesthesia, Adult, Care After These instructions provide you with information about caring for yourself after your procedure. Your health care provider may also give you more specific instructions. Your treatment has been planned according to current medical practices, but problems sometimes occur. Call your health care provider if you have any problems or questions after your procedure. What can I expect after the procedure? After the procedure, it is common to have:  Vomiting.  A sore throat.  Mental slowness.  It is common to feel:  Nauseous.  Cold or shivery.  Sleepy.  Tired.  Sore or achy, even in parts of your body where you did not have surgery.  Follow these instructions at home: For at least 24 hours after the procedure:  Do not: ? Participate in activities where you could fall or become injured. ? Drive. ? Use heavy machinery. ? Drink alcohol. ? Take sleeping pills or medicines that cause drowsiness. ? Make important decisions or sign legal documents. ? Take care of children on your own.  Rest. Eating and drinking  If you vomit, drink water, juice, or soup when you can drink without vomiting.  Drink enough fluid to keep your urine clear or pale yellow.  Make sure you have little or no nausea before eating solid foods.  Follow the diet recommended by your health care provider. General instructions  Have a responsible adult stay with you until you are awake and alert.  Return to your normal activities as told by your health care provider. Ask your health care provider what activities are safe for you.  Take over-the-counter and prescription medicines only as told by your health care provider.  If you smoke, do not smoke without supervision.  Keep all follow-up visits as told by your health care provider. This is important. Contact a health care provider if:  You continue to have nausea or  vomiting at home, and medicines are not helpful.  You cannot drink fluids or start eating again.  You cannot urinate after 8-12 hours.  You develop a skin rash.  You have fever.  You have increasing redness at the site of your procedure. Get help right away if:  You have difficulty breathing.  You have chest pain.  You have unexpected bleeding.  You feel that you are having a life-threatening or urgent problem. This information is not intended to replace advice given to you by your health care provider. Make sure you discuss any questions you have with your health care provider. Document Released: 12/01/2000 Document Revised: 01/28/2016 Document Reviewed: 08/09/2015 Elsevier Interactive Patient Education  2018 [employer]. Dilation and Curettage or Vacuum Curettage, Care After This sheet gives you information about how to care for yourself after your procedure. Your health care provider may also give you more specific instructions. If you have problems or questions, contact your health care provider. What can I expect after the procedure? After your procedure, it is common to have:  Mild pain or cramping.  Some vaginal bleeding or spotting.  These may last for up to 2 weeks after your procedure. Follow these instructions at home: Activity   Do not drive or use heavy machinery while taking prescription pain medicine.  Avoid driving for the first 24 hours after your procedure.  Take frequent, short walks, followed by rest periods, throughout the day. Ask your health care provider what activities are safe for you. After 1-2 days, you may be able to return  to your normal activities.  Do not lift anything heavier than 10 lb (4.5 kg) until your health care provider approves.  For at least 2 weeks, or as long as told by your health care provider, do not: ? Douche. ? Use tampons. ? Have sexual intercourse. General instructions   Take over-the-counter and prescription  medicines only as told by your health care provider. This is especially important if you take blood thinning medicine.  Do not take baths, swim, or use a hot tub until your health care provider approves. Take showers instead of baths.  Wear compression stockings as told by your health care provider. These stockings help to prevent blood clots and reduce swelling in your legs.  It is your responsibility to get the results of your procedure. Ask your health care provider, or the department performing the procedure, when your results will be ready.  Keep all follow-up visits as told by your health care provider. This is important. Contact a health care provider if:  You have severe cramps that get worse or that do not get better with medicine.  You have severe abdominal pain.  You cannot drink fluids without vomiting.  You develop pain in a different area of your pelvis.  You have bad-smelling vaginal discharge.  You have a rash. Get help right away if:  You have vaginal bleeding that soaks more than one sanitary pad in 1 hour, for 2 hours in a row.  You pass large blood clots from your vagina.  You have a fever that is above 100.73F (38.0C).  Your abdomen feels very tender or hard.  You have chest pain.  You have shortness of breath.  You cough up blood.  You feel dizzy or light-headed.  You faint.  You have pain in your neck or shoulder area. This information is not intended to replace advice given to you by your health care provider. Make sure you discuss any questions you have with your health care provider. Document Released: 08/22/2000 Document Revised: 04/23/2016 Document Reviewed: 03/27/2016 Elsevier Interactive Patient Education  [employer].

## 2017-07-06 NOTE — Anesthesia Postprocedure Evaluation (Signed)
Anesthesia Post Note  Patient: ANYRA KAUFMAN  Procedure(s) Performed: DILATATION & CURETTAGE/HYSTEROSCOPY WITH MYOSURE (N/A )     Patient location during evaluation: PACU Anesthesia Type: General Level of consciousness: awake and alert Pain management: pain level controlled Vital Signs Assessment: post-procedure vital signs reviewed and stable Respiratory status: spontaneous breathing, nonlabored ventilation, respiratory function stable and patient connected to nasal cannula oxygen Cardiovascular status: blood pressure returned to baseline and stable Postop Assessment: no apparent nausea or vomiting Anesthetic complications: no    Last Vitals:  Vitals:   07/03/17 1000 07/03/17 1044  BP: 123/76 119/73  Pulse: 99 96  Resp: 10 16  Temp:  36.7 C  SpO2: 98% 97%    Last Pain:  Vitals:   07/03/17 1044  TempSrc:   PainSc: 2                  Mahdi Frye

## 2017-07-07 ENCOUNTER — Encounter (HOSPITAL_COMMUNITY): Payer: Self-pay | Admitting: Obstetrics and Gynecology

## 2017-07-07 ENCOUNTER — Ambulatory Visit: Payer: 59 | Admitting: Nurse Practitioner

## 2017-09-29 IMAGING — US US ABDOMEN LIMITED
1 series · 14 of 25 positions shown · non-contrast
Comparison: None.

CLINICAL DATA: 55-year-old female with right upper quadrant
abdominal pain for 3 days.

EXAM:
ULTRASOUND ABDOMEN LIMITED RIGHT UPPER QUADRANT

[Series 1: us abdomen limited · 0.28mm/px · 14 of 42 slices shown]
[im 1/42]
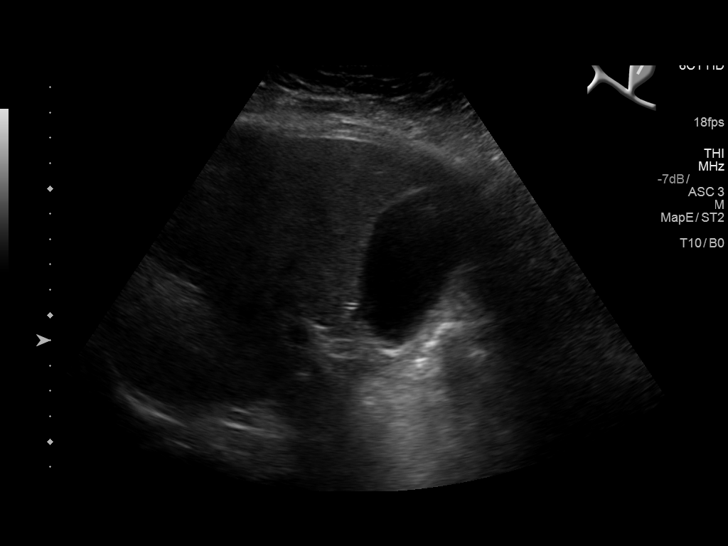
[im 4/42]
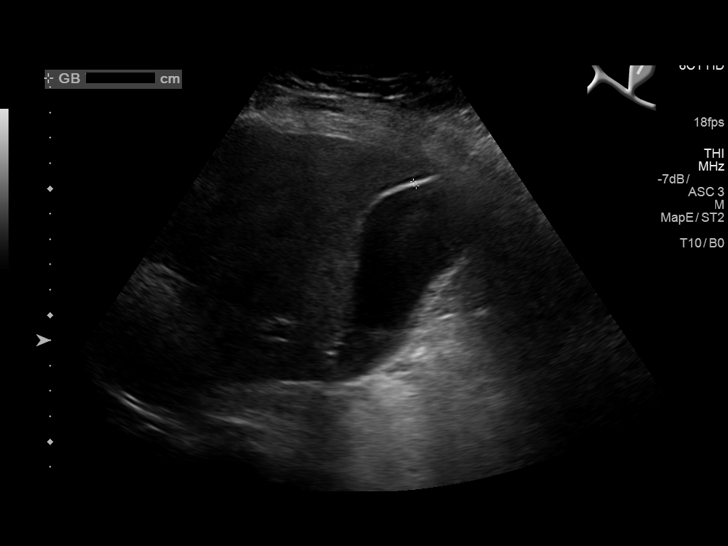
[im 7/42]
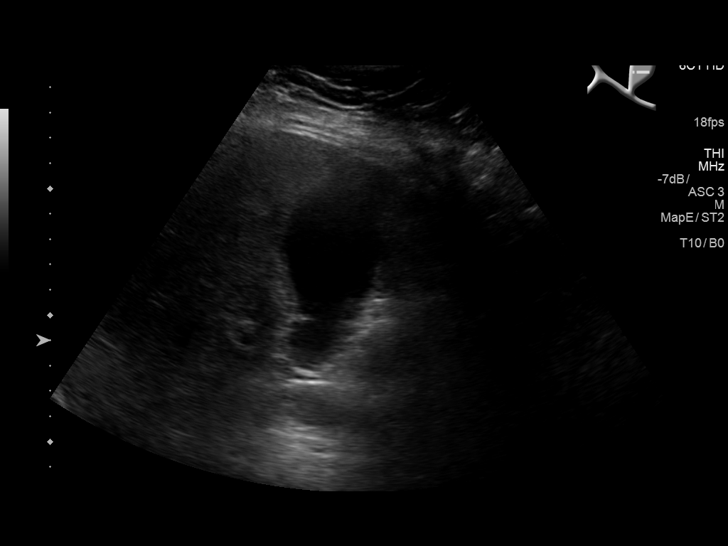
[im 11/42]
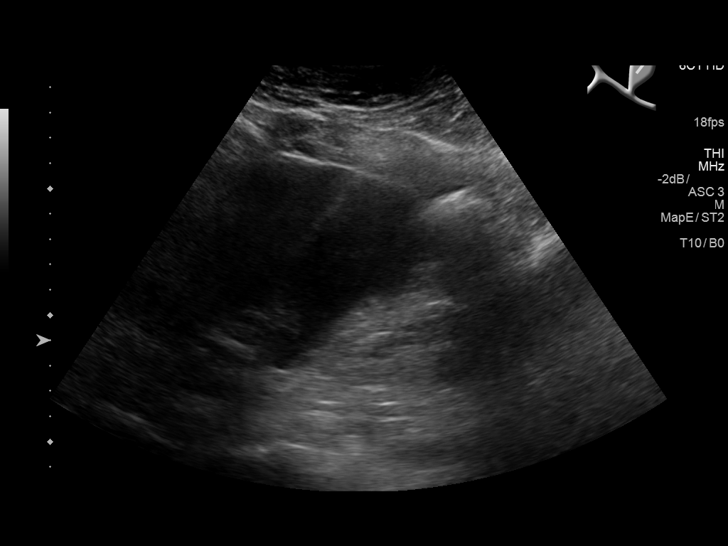
[im 14/42]
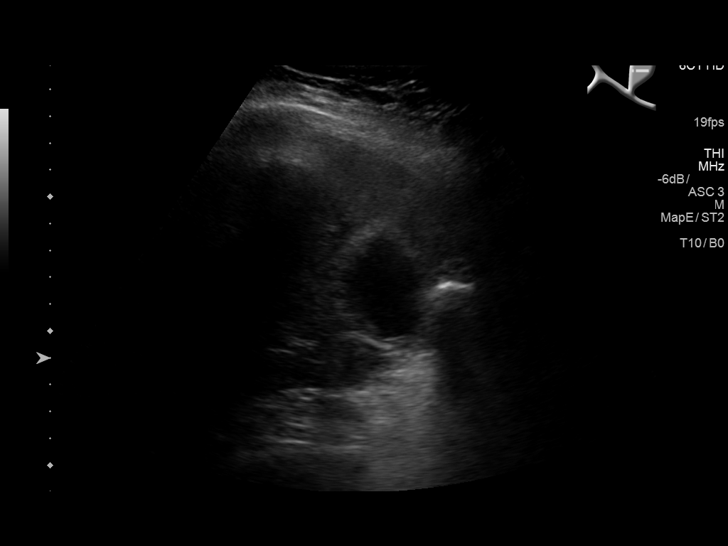
[im 16/42]
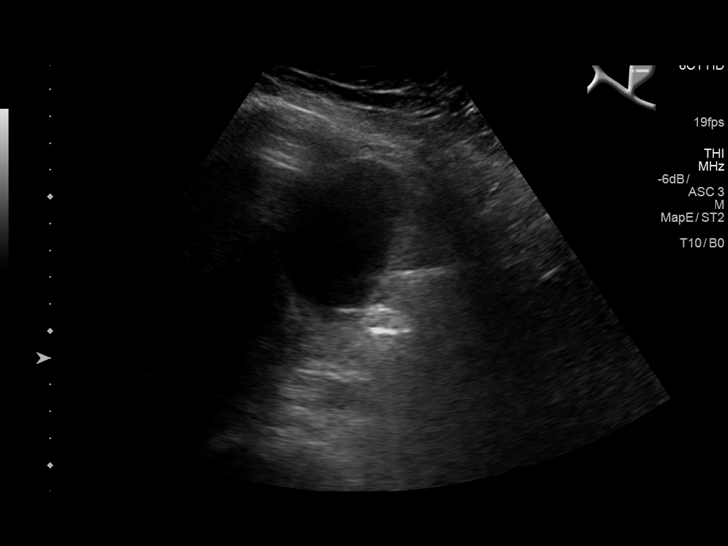
[im 19/42]
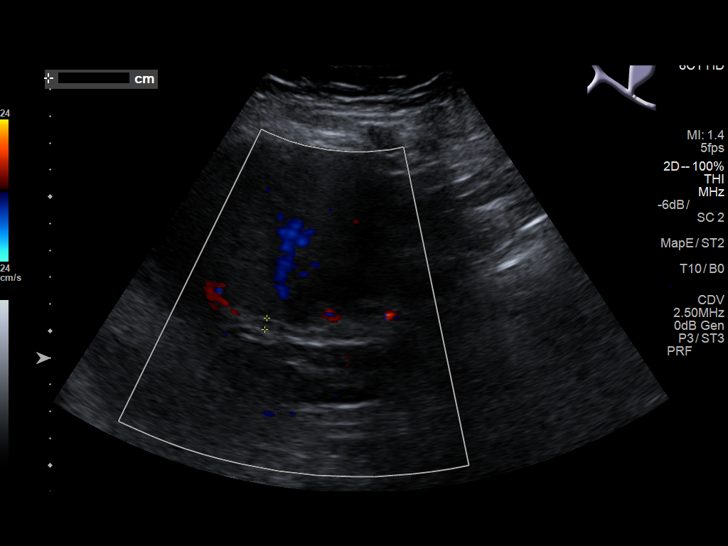
[im 23/42]
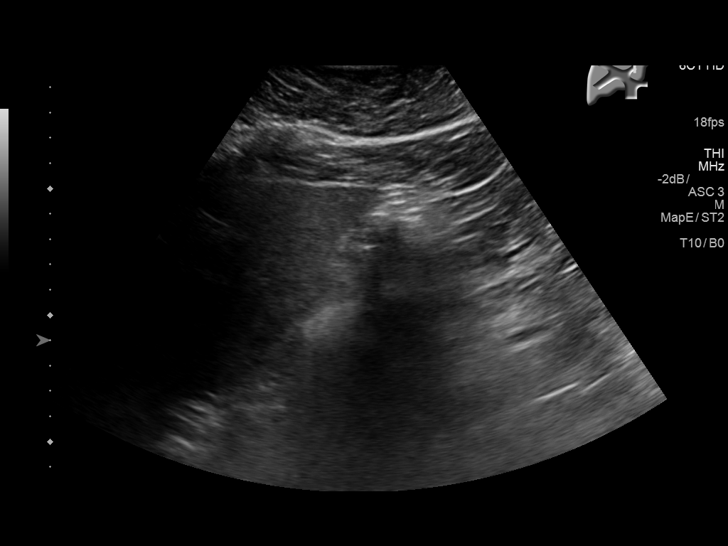
[im 26/42]
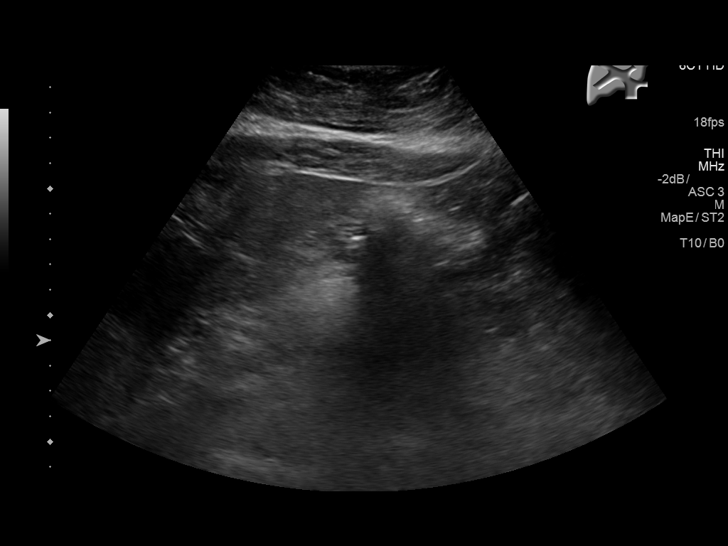
[im 28/42]
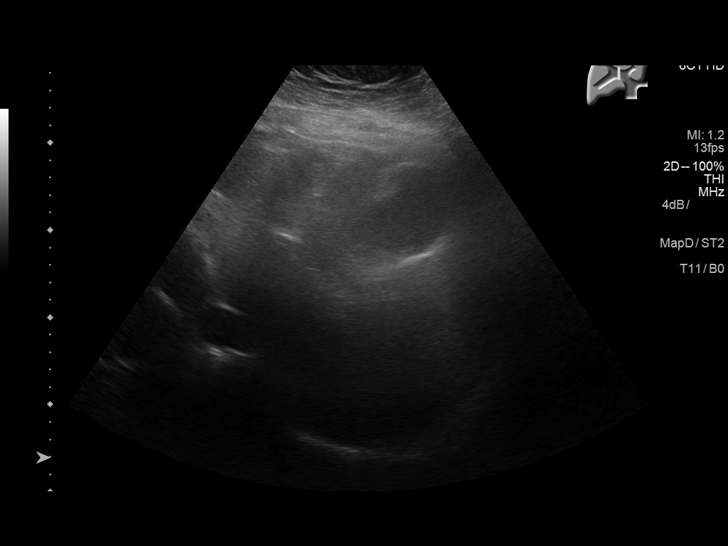
[im 31/42]
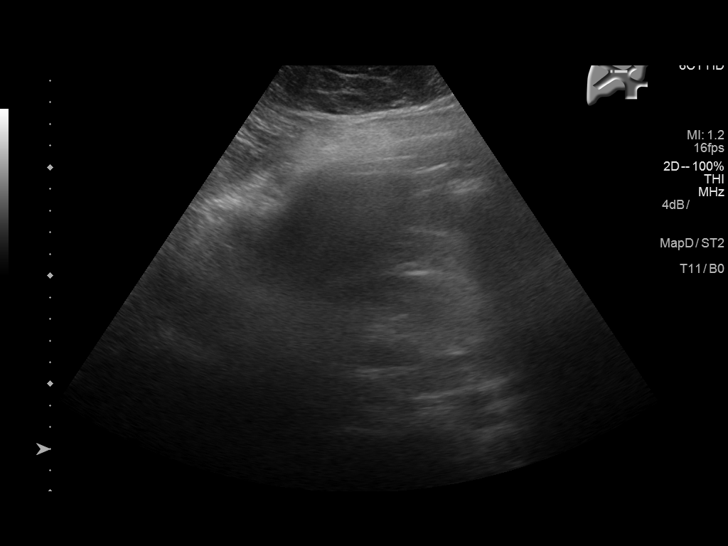
[im 35/42]
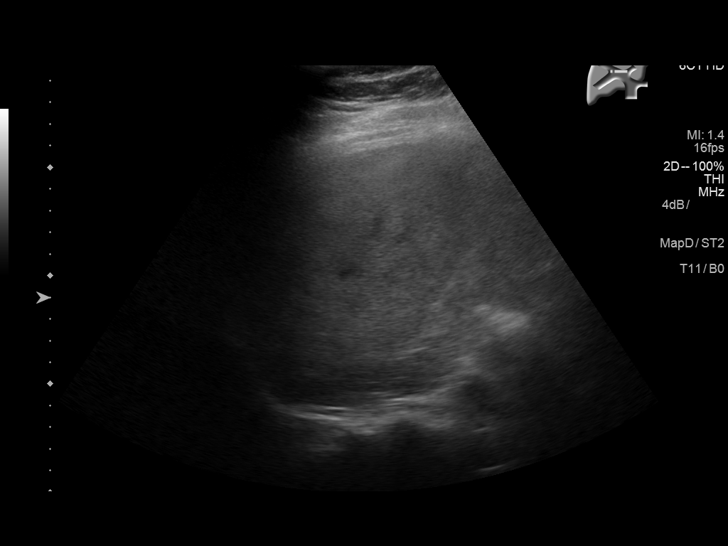
[im 38/42]
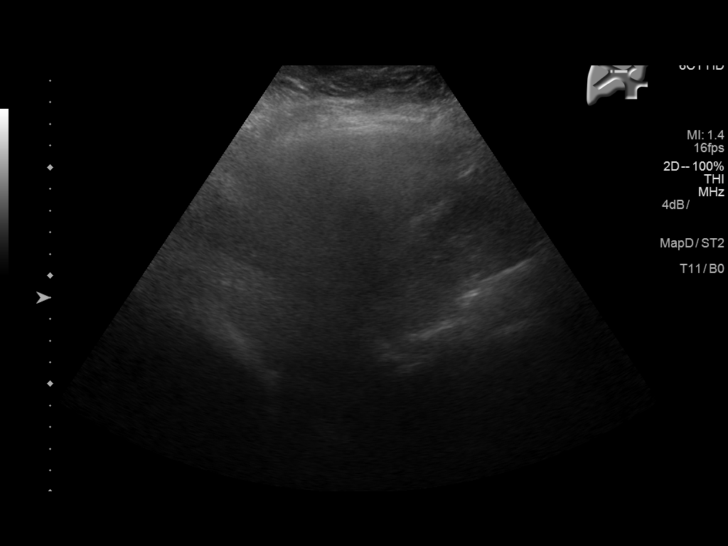
[im 42/42]
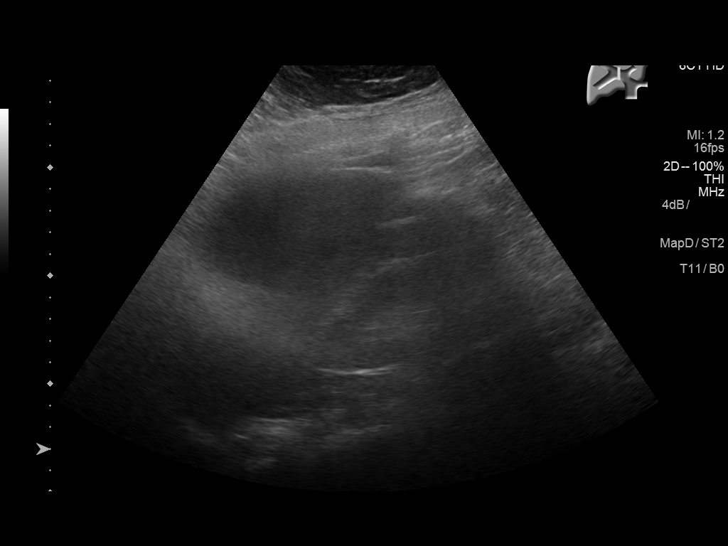

[14 of 25 positions shown; findings below may reference images not displayed]

FINDINGS: Gallbladder:

The gallbladder is unremarkable. There is no evidence of
cholelithiasis or acute cholecystitis.

Common bile duct:

Diameter: 4 mm. There is no evidence of intrahepatic or extrahepatic
biliary dilatation.

Liver:

Increased hepatic echogenicity likely represents hepatic steatosis.
No other hepatic abnormalities identified.
IMPRESSION: No evidence of acute abnormality. No evidence of cholelithiasis or
acute cholecystitis.

Question hepatic steatosis.

## 2018-04-15 LAB — LIPID PANEL
CHOLESTEROL: 196 (ref 0–200)
HDL: 51 (ref 35–70)
LDL Cholesterol: 119
Triglycerides: 130 (ref 40–160)

## 2018-04-15 LAB — BASIC METABOLIC PANEL
Creatinine: 0.8 (ref 0.5–1.1)
GLUCOSE: 93

## 2018-04-15 LAB — HEMOGLOBIN A1C: HEMOGLOBIN A1C: 5.4

## 2018-05-27 LAB — HM PAP SMEAR: HM Pap smear: NEGATIVE

## 2018-06-23 ENCOUNTER — Ambulatory Visit (INDEPENDENT_AMBULATORY_CARE_PROVIDER_SITE_OTHER): Payer: Managed Care, Other (non HMO) | Admitting: Nurse Practitioner

## 2018-06-23 ENCOUNTER — Other Ambulatory Visit: Payer: Self-pay

## 2018-06-23 ENCOUNTER — Encounter: Payer: Self-pay | Admitting: Nurse Practitioner

## 2018-06-23 VITALS — BP 133/77 | HR 91 | Temp 98.8°F | Ht 60.5 in | Wt 216.8 lb

## 2018-06-23 DIAGNOSIS — Z713 Dietary counseling and surveillance: Secondary | ICD-10-CM | POA: Diagnosis not present

## 2018-06-23 DIAGNOSIS — I10 Essential (primary) hypertension: Secondary | ICD-10-CM | POA: Diagnosis not present

## 2018-06-23 DIAGNOSIS — E7800 Pure hypercholesterolemia, unspecified: Secondary | ICD-10-CM | POA: Diagnosis not present

## 2018-06-23 NOTE — Patient Instructions (Addendum)
Terri Gamble,   Thank you for coming in to clinic today.  1. Great work with weight loss! - Increase your physical activity until you are increasing your heart rate for 20 minutes on most days of the week.   Please schedule a follow-up appointment with Cassell Smiles, AGNP. Return if symptoms worsen or fail to improve.  If you have any other questions or concerns, please feel free to call the clinic or send a message through [place]. You may also schedule an earlier appointment if necessary.  You will receive a survey after today's visit either digitally by e-mail or paper by [employer]. Your experiences and feedback matter to [place].  Please respond so we know how we are doing as we provide care for you.   Cassell Smiles, DNP, AGNP-BC Adult Gerontology Nurse Practitioner [place]

## 2018-06-23 NOTE — Progress Notes (Signed)
Subjective:    Patient ID: [name], female    DOB: 10-07-1960, 57 y.o.   MRN: [id number]  SHARHONDA ATWOOD is a 57 y.o. female presenting on 06/23/2018 for Annual Exam (Biometric screening)   HPI Biometric screening appeal: Already feels much better with weight loss over last year.  Patient has lost 44 lbs over last 1 year.  She is eating a diet with high protein, low carb, premiere shakes, collagen powder daily.  Is watching how much and how frequently she has foods that are not as good for her like refined carbs and sweets.  Has not increased fat in diet as is done for keto as she knows this will increase cholesterol.  Patient's goal weight is to lose an additional 50 lbs long term.   Works 7 days per week: Exercises rarely 2 days is at a more mobile job 5 days is sedentary at work.  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  Continues to take lisinopril 10 mg once daily.  Reports no side effects, including cough. - Pt denies headache, lightheadedness, dizziness, changes in vision, chest tightness/pressure, palpitations, leg swelling, sudden loss of speech or loss of consciousness.   Family History  Problem Relation Age of Onset  . Heart disease Mother   . Kidney disease Mother   . Hypertension Mother   . Diabetes Mother   . Diabetes Maternal Grandmother   . Hypertension Maternal Grandmother   . Heart disease Maternal Grandmother   . Stroke Maternal Grandmother   . Stomach cancer Paternal Uncle        x 2  . Lung cancer Paternal Uncle   . Lung cancer Maternal Uncle   . Heart disease Maternal Grandfather   . Cardiomyopathy Maternal Grandfather   . Heart disease Paternal Grandmother   . Stroke Paternal Grandmother   . Hypertension Paternal Grandmother   . Alzheimer's disease Paternal Grandfather   . Colon cancer Neg Hx   . Pancreatic cancer Neg Hx   . Esophageal cancer Neg Hx   . Liver disease Neg Hx   . Ovarian cancer Neg Hx   . Breast  cancer Neg Hx      Social History   Tobacco Use  . Smoking status: Former Smoker    Packs/day: 0.50    Years: 15.00    Pack years: 7.50    Types: Cigarettes    Last attempt to quit: 09/08/2004    Years since quitting: 13.7  . Smokeless tobacco: Never Used  Substance Use Topics  . Alcohol use: Yes    Alcohol/week: 0.0 standard drinks    Comment: 1 drink a month  . Drug use: No    Review of Systems Per HPI unless specifically indicated above     Objective:    BP 133/77   Pulse 91   Temp 98.8 F (37.1 C) (Oral)   Ht 5' 0.5" (1.537 m)   Wt 216 lb 12.8 oz (98.3 kg)   BMI 41.64 kg/m   Wt Readings from Last 3 Encounters:  06/23/18 216 lb 12.8 oz (98.3 kg)  06/22/17 260 lb 6 oz (118.1 kg)  06/09/17 258 lb 12.8 oz (117.4 kg)    Physical Exam  Constitutional: She is oriented to person, place, and time. She appears well-developed and well-nourished. No distress.  HENT:  Head: Normocephalic and atraumatic.  Cardiovascular: Normal rate, regular rhythm, S1 normal, S2 normal, normal heart sounds and intact distal pulses.  Pulmonary/Chest: Effort normal and breath sounds  normal. No respiratory distress.  Neurological: She is alert and oriented to person, place, and time.  Skin: Skin is warm and dry. Capillary refill takes less than 2 seconds.  Psychiatric: She has a normal mood and affect. Her behavior is normal. Judgment and thought content normal.  Vitals reviewed.   Results for orders placed or performed during the hospital encounter of 06/22/17  Type and screen  Result Value Ref Range   ABO/RH(D) O POS    Antibody Screen NEG    Sample Expiration 07/06/2017    Extend sample reason NO TRANSFUSIONS OR PREGNANCY IN THE PAST 3 MONTHS   ABO/Rh  Result Value Ref Range   ABO/RH(D) O POS       Assessment & Plan:   Problem List Items Addressed This Visit      Cardiovascular and Mediastinum   Hypertension Stable.  Patient is continuing on lisinopril 10 mg once daily.  No  changes today.  Continue work on Paramedic.  Last kidney function normal on biometric labs.  Patient declines additional labs today.  Follow-up 1 year.    Other Visit Diagnoses    Weight loss counseling, encounter for    -  Primary Patient with ongoing desire for weight loss.  Has achieved and maintained weight loss of 44 lbs in last 12 months. Workplace biometric screening resulted in need to file appeal.  Achievable weight loss of additional 15 lbs in next 12 months.    Form completed.  Instructed patient to increase physical activity as additional method for achieving better weight loss.  Follow-up 1 year.    Pure hypercholesterolemia     Stable with mild improvement over 1 year ago.  HDL reduced, but expected with weight loss.  Continue lifestyle management only without medications.   - HDL reviewed: lower this year - discussed this is expected with weight loss and will rebound with weight stability - LDL increased: likely genetic. Not current candidate for statin therapy.  ASCVD risk is 4.5% in 10 years.  No current benefit > than risks for starting statin therapy.   Follow-up labs 1 year.      Follow up plan: Return if symptoms worsen or fail to improve.  Cassell Smiles, DNP, AGPCNP-BC Adult Gerontology Primary Care Nurse Practitioner [facility] 06/23/2018, 9:17 AM

## 2018-06-29 ENCOUNTER — Encounter: Payer: Self-pay | Admitting: Nurse Practitioner

## 2018-10-18 ENCOUNTER — Ambulatory Visit (INDEPENDENT_AMBULATORY_CARE_PROVIDER_SITE_OTHER): Payer: Managed Care, Other (non HMO) | Admitting: Nurse Practitioner

## 2018-10-18 ENCOUNTER — Other Ambulatory Visit: Payer: Self-pay

## 2018-10-18 ENCOUNTER — Other Ambulatory Visit: Payer: Self-pay | Admitting: Nurse Practitioner

## 2018-10-18 ENCOUNTER — Encounter: Payer: Self-pay | Admitting: Nurse Practitioner

## 2018-10-18 VITALS — BP 150/86 | HR 90 | Temp 98.2°F | Ht 60.5 in | Wt 212.4 lb

## 2018-10-18 DIAGNOSIS — I10 Essential (primary) hypertension: Secondary | ICD-10-CM | POA: Diagnosis not present

## 2018-10-18 DIAGNOSIS — M545 Low back pain, unspecified: Secondary | ICD-10-CM

## 2018-10-18 MED ORDER — BACLOFEN 10 MG PO TABS: 10.0000 mg | ORAL_TABLET | Freq: Three times a day (TID) | ORAL | 0 refills | Status: DC

## 2018-10-18 MED ORDER — LISINOPRIL 10 MG PO TABS: 10.0000 mg | ORAL_TABLET | Freq: Every day | ORAL | 1 refills | Status: DC

## 2018-10-18 NOTE — Progress Notes (Signed)
Subjective:    Patient ID: Terri Gamble, female    DOB: [birth year], 58 y.o.   MRN: [id number]  Terri Gamble is a 58 y.o. female presenting on 10/18/2018 for Back Pain (constant Lt side lower back pain that worsen with certain movement x 3 days  )   HPI Back pain Left lower back pain with sensations of increased pressure.  Worsens with full bladder and movements (twisting, standing, pushing recliner down, increasing pressure, tensing up, partial forward bends. - no known injury - Patient has taken 1/2 oxycodone leftover from surgery.  Made her head spin and did not repeat. - Ibuprofen is regular and helps some, but not significantly.  More pain this afternoon as last dose was this am. - Walking slowly/guarded. - No severe pain while seated, but has nagging pain. - Pain like this occurred once before approx 10 years ago.  Hypertension - She is not checking BP at home or outside of clinic.    - Current medications: patient has stopped taking lisinopril due to weight loss and feeling like she didn't need it.  When taking, she was tolerating well without side effects.  Took only for a few weeks to qualify for surgery.   - She is not currently symptomatic. - Pt denies headache, lightheadedness, dizziness, changes in vision, chest tightness/pressure, palpitations, leg swelling, sudden loss of speech or loss of consciousness. - She  reports no regular exercise routine. - Her diet is moderate in salt, moderate in fat, and moderate in carbohydrates.   Social History   Tobacco Use  . Smoking status: Former Smoker    Packs/day: 0.50    Years: 15.00    Pack years: 7.50    Types: Cigarettes    Last attempt to quit: 09/08/2004    Years since quitting: 14.1  . Smokeless tobacco: Never Used  Substance Use Topics  . Alcohol use: Yes    Alcohol/week: 0.0 standard drinks    Comment: 1 drink a month  . Drug use: No    Review of Systems Per HPI unless specifically indicated  above     Objective:    BP (!) 150/86 (BP Location: Left Arm, Patient Position: Sitting, Cuff Size: Large)   Pulse 90   Temp 98.2 F (36.8 C) (Oral)   Ht 5' 0.5" (1.537 m)   Wt 212 lb 6.4 oz (96.3 kg)   BMI 40.80 kg/m   Wt Readings from Last 3 Encounters:  10/18/18 212 lb 6.4 oz (96.3 kg)  06/23/18 216 lb 12.8 oz (98.3 kg)  06/22/17 260 lb 6 oz (118.1 kg)    Physical Exam Vitals signs reviewed.  Constitutional:      General: She is not in acute distress.    Appearance: She is well-developed.  HENT:     Head: Normocephalic and atraumatic.  Cardiovascular:     Rate and Rhythm: Normal rate and regular rhythm.     Pulses:          Radial pulses are 2+ on the right side and 2+ on the left side.       Posterior tibial pulses are 1+ on the right side and 1+ on the left side.     Heart sounds: Normal heart sounds, S1 normal and S2 normal.  Pulmonary:     Effort: Pulmonary effort is normal. No respiratory distress.     Breath sounds: Normal breath sounds and air entry.  Musculoskeletal:     Right lower leg: No edema.  Left lower leg: No edema.     Comments: Low Back Inspection: Normal appearance, Large body habitus, no spinal deformity, symmetrical. Palpation: No tenderness over spinous processes. LEFT sided lumbar paraspinal muscles tender and with hypertonicity/spasm. ROM: Full active ROM forward flex / back extension, rotation L/R with mild to moderate discomfort Special Testing: Seated SLR negative for radicular pain bilaterally but with reproduced localized L sided back pain but negative for radicular pain.  Strength: Bilateral hip flex/ext 5/5, knee flex/ext 5/5, ankle dorsiflex/plantarflex 5/5 Neurovascular: intact distal sensation to light touch   Skin:    General: Skin is warm and dry.     Capillary Refill: Capillary refill takes less than 2 seconds.  Neurological:     Mental Status: She is alert and oriented to person, place, and time.  Psychiatric:         Attention and Perception: Attention normal.        Mood and Affect: Mood and affect normal.        Behavior: Behavior normal. Behavior is cooperative.    Results for orders placed or performed in visit on 16/10/96  Basic metabolic panel  Result Value Ref Range   Glucose 93    Creatinine 0.8 0.5 - 1.1  Lipid panel  Result Value Ref Range   Triglycerides 130 40 - 160   Cholesterol 196 0 - 200   HDL 51 35 - 70   LDL Cholesterol 119   Hemoglobin A1c  Result Value Ref Range   Hemoglobin A1C 5.4       Assessment & Plan:   Problem List Items Addressed This Visit      Cardiovascular and Mediastinum   Hypertension    Other Visit Diagnoses    Lumbar back pain    -  Primary   Relevant Medications   ibuprofen (ADVIL,MOTRIN) 200 MG tablet   baclofen (LIORESAL) 10 MG tablet     # Hypertension Uncontrolled hypertension.  BP goal < 130/80.  Pt is working on lifestyle modifications.  Taking medications tolerating well without side effects. No currenty complications.  May have higher reading today due to pain.  Second reading higher than first due to pain with exam.  Plan: 1. Resume lisinopril 10 mg once daily 2. Encouraged heart healthy diet and increasing exercise to 30 minutes most days of the week. 3. Check BP 1-2 x per week at home, keep log, and bring to clinic at next appointment. 4. Follow up 3-6 months.    # Pain likely self-limited.  Muscle strain possible complicated by large body habitus.  Plan:  1. Treat with OTC pain meds (acetaminophen and ibuprofen).  Discussed alternate dosing and max dosing. 2. Apply heat and/or ice to affected area. 3. May also apply a muscle rub with lidocaine or lidocaine patch after heat or ice. 4. Take muscle relaxer baclofen 5-10 1/2 - 1 tablet up to three times daily.  Cautioned drowsiness. 5. Follow up 4-6 weeks.   Meds ordered this encounter  Medications  . baclofen (LIORESAL) 10 MG tablet    Sig: Take 1 tablet (10 mg total) by mouth  3 (three) times daily.    Dispense:  30 each    Refill:  0    Order Specific Question:   Supervising Provider    Answer:   Olin Hauser [[id number]]    Follow up plan: Return 4-6 weeks if symptoms worsen or fail to improve.  Cassell Smiles, DNP, AGPCNP-BC Adult Gerontology Primary Care Nurse Practitioner [facility]  [facility] 10/18/2018, 3:23 PM

## 2018-10-18 NOTE — Patient Instructions (Addendum)
Terri Gamble,   Thank you for coming in to clinic today.  1. You have a lumbar muscle strain. - Start taking Tylenol extra strength 1 to 2 tablets every 6-8 hours for aches or fever/chills for next few days as needed.  Do not take more than 3,000 mg in 24 hours from all medicines.  May take Ibuprofen as well if tolerated 200-400mg  every 8 hours as needed. May alternate tylenol and ibuprofen in same day. - Use heat and ice.  Apply this for 15 minutes at a time 6-8 times per day.   - Muscle rub with lidocaine, lidocaine patch, Biofreeze, or tiger balm for topical pain relief.  Avoid using this with heat and ice to avoid burns. - START muscle relaxer baclofen 5 mg one tablet up to three times daily.  Start only at bedtime for initial dose.  This can cause drowsiness, so use caution.  It may be best to only take this at night for helping you during sleep.  2. Low Back Pain Exercises See other page with pictures of each exercise.  Start with 1 or 2 of these exercises that you are most comfortable with. Do not do any exercises that cause you significant worsening pain. Some of these may cause some "stretching soreness" but it should go away after you stop the exercise, and get better over time. Gradually increase up to 3-4 exercises as tolerated.  Standing hamstring stretch: Place the heel of your leg on a stool about 15 inches high. Keep your knee straight. Lean forward, bending at the hips until you feel a mild stretch in the back of your thigh. Make sure you do not roll your shoulders and bend at the waist when doing this or you will stretch your lower back instead. Hold the stretch for 15 to 30 seconds. Repeat 3 times. Repeat the same stretch on your other leg.  Cat and camel: Get down on your hands and knees. Let your stomach sag, allowing your back to curve downward. Hold this position for 5 seconds. Then arch your back and hold for 5 seconds. Do 3 sets of 10.  Quadriped Arm/Leg Raises:  Get down on your hands and knees. Tighten your abdominal muscles to stiffen your spine. While keeping your abdominals tight, raise one arm and the opposite leg away from you. Hold this position for 5 seconds. Lower your arm and leg slowly and alternate sides. Do this 10 times on each side.  Pelvic tilt: Lie on your back with your knees bent and your feet flat on the floor. Tighten your abdominal muscles and push your lower back into the floor. Hold this position for 5 seconds, then relax. Do 3 sets of 10.  Partial curl: Lie on your back with your knees bent and your feet flat on the floor. Tighten your stomach muscles and flatten your back against the floor. Tuck your chin to your chest. With your hands stretched out in front of you, curl your upper body forward until your shoulders clear the floor. Hold this position for 3 seconds. Don't hold your breath. It helps to breathe out as you lift your shoulders up. Relax. Repeat 10 times. Build to 3 sets of 10. To challenge yourself, clasp your hands behind your head and keep your elbows out to the side.  Lower trunk rotation: Lie on your back with your knees bent and your feet flat on the floor. Tighten your abdominal muscles and push your lower back into the floor.  Keeping your shoulders down flat, gently rotate your legs to one side, then the other as far as you can. Repeat 10 to 20 times.  Single knee to chest stretch: Lie on your back with your legs straight out in front of you. Bring one knee up to your chest and grasp the back of your thigh. Pull your knee toward your chest, stretching your buttock muscle. Hold this position for 15 to 30 seconds and return to the starting position. Repeat 3 times on each side.  Double knee to chest: Lie on your back with your knees bent and your feet flat on the floor. Tighten your abdominal muscles and push your lower back into the floor. Pull both knees up to your chest. Hold for 5 seconds and repeat 10 to 20  times.      Please schedule a follow-up appointment with Cassell Smiles, AGNP. Return 4-6 weeks if symptoms worsen or fail to improve.  If you have any other questions or concerns, please feel free to call the clinic or send a message through Page. You may also schedule an earlier appointment if necessary.  You will receive a survey after today's visit either digitally by e-mail or paper by [employer]. Your experiences and feedback matter to [place].  Please respond so we know how we are doing as we provide care for you.   Cassell Smiles, DNP, AGNP-BC Adult Gerontology Nurse Practitioner [place]

## 2019-01-05 ENCOUNTER — Other Ambulatory Visit: Payer: Self-pay

## 2019-01-05 ENCOUNTER — Ambulatory Visit (INDEPENDENT_AMBULATORY_CARE_PROVIDER_SITE_OTHER): Payer: Managed Care, Other (non HMO) | Admitting: Family Medicine

## 2019-01-05 ENCOUNTER — Encounter: Payer: Self-pay | Admitting: Family Medicine

## 2019-01-05 DIAGNOSIS — R609 Edema, unspecified: Secondary | ICD-10-CM

## 2019-01-05 DIAGNOSIS — R252 Cramp and spasm: Secondary | ICD-10-CM | POA: Diagnosis not present

## 2019-01-05 NOTE — Progress Notes (Signed)
Virtual Visit via Telephone The purpose of this virtual visit is to provide medical care while limiting exposure to the novel coronavirus (COVID19) for both patient and office staff.  Consent was obtained for phone visit:  Yes.   Answered questions that patient had about telehealth interaction:  Yes.   I discussed the limitations, risks, security and privacy concerns of performing an evaluation and management service by telephone. I also discussed with the patient that there may be a patient responsible charge related to this service. The patient expressed understanding and agreed to proceed.  Patient Location: Work (outside) Provider Location: [facility] (Office)  PCP is Cassell Smiles, AGPCNP-BC - I am currently covering during her maternity leave.  ---------------------------------------------------------------------- Chief Complaint  Patient presents with  . Leg Pain    cramps onset 3 days worst at thigh 2 weeks ago, edema feet and hands goes down durnig day for hands but not for legs    S: Reviewed CMA documentation. I have called patient and gathered additional HPI as follows:  Muscle Cramping legs, nocturnal episode / Peripheral Edema Reports episode 2 weeks ago had severe upper thigh cramp with residual soreness eventually improved, she has had minor muscle cramps before but no significant problems with it, now had severe episode, asking what she can do about it. She spoke with friends/coworker and they recommended mustard, she tried this recently over past weekend when she felt cramping coming on and it was helpful in curbing the cramp never got as severe as last time. - She is asking about OTC or rx medication options  - Admits Restless Leg Syndrome nightly as well contributing - Active working 7 days a week, standing on feet all day on weekend job - Admits some anxiety, maybe not eating enough fruit or hydrating properly - Admits edema in hands and legs, worse  with prolong standing for work, hands improve in evening overnight, legs still retain some fluid, not using compression  Denies any high risk travel to areas of current concern for [place]. Denies any known or suspected exposure to person with or possibly with COVID19.  Denies any fevers, chills, sweats, body ache, cough, shortness of breath, sinus pain or pressure, headache, abdominal pain, diarrhea  Past Medical History:  Diagnosis Date  . Hypertension   . Seasonal allergies    Social History   Tobacco Use  . Smoking status: Former Smoker    Packs/day: 0.50    Years: 15.00    Pack years: 7.50    Types: Cigarettes    Last attempt to quit: 09/08/2004    Years since quitting: 14.3  . Smokeless tobacco: Never Used  Substance Use Topics  . Alcohol use: Yes    Alcohol/week: 0.0 standard drinks    Comment: 1 drink a month  . Drug use: No    Current Outpatient Medications:  .  lisinopril (PRINIVIL,ZESTRIL) 10 MG tablet, Take 1 tablet (10 mg total) by mouth daily., Disp: 90 tablet, Rfl: 1 .  valACYclovir (VALTREX) 500 MG tablet, Take 500 mg by mouth 2 (two) times daily as needed (for fever blisters)., Disp: , Rfl:  .  ibuprofen (ADVIL,MOTRIN) 200 MG tablet, Take 400 mg by mouth 3 (three) times daily., Disp: , Rfl:  .  Multiple Vitamins-Minerals (MULTIVITAMIN WOMEN PO), Take 1 tablet by mouth daily., Disp: , Rfl:   Depression screen [facility] 2/9 01/05/2019 06/23/2018 06/09/2017  Decreased Interest 0 0 1  Down, Depressed, Hopeless 0 0 0  PHQ - 2 Score  0 0 1  Altered sleeping - - 2  Tired, decreased energy - - 1  Change in appetite - - 2  Feeling bad or failure about yourself  - - 0  Trouble concentrating - - 0  Moving slowly or fidgety/restless - - 0  Suicidal thoughts - - 0  PHQ-9 Score - - 6  Difficult doing work/chores - - Somewhat difficult    GAD 7 : Generalized Anxiety Score 06/09/2017  Nervous, Anxious, on Edge 2  Control/stop worrying 0  Worry too much - different things 0   Trouble relaxing 2  Restless 1  Easily annoyed or irritable 2  Afraid - awful might happen 0  Total GAD 7 Score 7  Anxiety Difficulty Somewhat difficult    -------------------------------------------------------------------------- O: No physical exam performed due to remote telephone encounter.  No results found for this or any previous visit (from the past 2160 hour(s)).  -------------------------------------------------------------------------- A&P:  Problem List Items Addressed This Visit    None    Visit Diagnoses    Muscle cramping    -  Primary   Peripheral edema         Clinically with muscle cramps, most likely due to usual MSK etiology with overuse muscles, especially postural leg muscles with prolong standing 7 days a week w/ work, and may be nutritional or hydration component - Improved temporarily on home remedy option  Plan - Trial on continued conservative home remedy mustard daily prophylaxis - Improve hydration w/ electrolytes, not just water - Improve diet - Use OTC Hyland's Leg Cramps PRN if cramp - Future re check chemistry panel and likely add magnesium with next blood panel  - For Edema - trial on RICE therapy, compression elevation, likely venous stasis, no other concerning factors reported at this time - reassurance given   No orders of the defined types were placed in this encounter.   Follow-up: - Return in 3-6 months for annual physical w/ PCP  Patient verbalizes understanding with the above medical recommendations including the limitation of remote medical advice.  Specific follow-up and call-back criteria were given for patient to follow-up or seek medical care more urgently if needed.   - Time spent in direct consultation with patient on phone: 9 minutes  Nobie Putnam, [facility] 01/05/2019, 2:50 PM

## 2019-01-05 NOTE — Patient Instructions (Signed)
  Leg cramps - Try spoonful of yellow mustard to relieve leg cramps or try daily to prevent the problem  - OTC natural option is Hyland's Leg Cramps (Dissolving tablet) take as needed for muscle cramps  - For swelling - use compression stockings, elevate legs - toes above the nose, rest can help as well, prolong standing will worsen swelling due to gravity with leaky veins  DUE for FASTING BLOOD WORK (no food or drink after midnight before the lab appointment, only water or coffee without cream/sugar on the morning of)  SCHEDULE "Lab Only" visit in the morning at the clinic for lab draw in 6 MONTHS   - Make sure Lab Only appointment is at about 1 week before your next appointment, so that results will be available  For Lab Results, once available within 2-3 days of blood draw, you can can log in to MyChart online to view your results and a brief explanation. Also, we can discuss results at next follow-up visit.   Please schedule a Follow-up Appointment to: Return in about 6 months (around 07/07/2019) for Annual Physical.  If you have any other questions or concerns, please feel free to call the office or send a message through [place]. You may also schedule an earlier appointment if necessary.  Additionally, you may be receiving a survey about your experience at our office within a few days to 1 week by e-mail or mail. We value your feedback.  Nobie Putnam, DO [place]

## 2019-04-25 ENCOUNTER — Other Ambulatory Visit: Payer: Self-pay

## 2019-04-25 DIAGNOSIS — Z20822 Contact with and (suspected) exposure to covid-19: Secondary | ICD-10-CM

## 2019-04-27 LAB — NOVEL CORONAVIRUS, NAA: SARS-CoV-2, NAA: NOT DETECTED

## 2019-04-29 ENCOUNTER — Ambulatory Visit (INDEPENDENT_AMBULATORY_CARE_PROVIDER_SITE_OTHER): Payer: Managed Care, Other (non HMO) | Admitting: Nurse Practitioner

## 2019-04-29 ENCOUNTER — Other Ambulatory Visit: Payer: Self-pay

## 2019-04-29 ENCOUNTER — Encounter: Payer: Self-pay | Admitting: Nurse Practitioner

## 2019-04-29 DIAGNOSIS — R1012 Left upper quadrant pain: Secondary | ICD-10-CM | POA: Diagnosis not present

## 2019-04-29 DIAGNOSIS — Z20822 Contact with and (suspected) exposure to covid-19: Secondary | ICD-10-CM

## 2019-04-29 DIAGNOSIS — R6889 Other general symptoms and signs: Secondary | ICD-10-CM | POA: Diagnosis not present

## 2019-04-29 MED ORDER — IPRATROPIUM BROMIDE 0.06 % NA SOLN: 2.0000 | Freq: Four times a day (QID) | NASAL | 12 refills | Status: DC

## 2019-04-29 NOTE — Patient Instructions (Addendum)
Terri Gamble,   Thank you for coming in to clinic today.  1. I am suspicious of you having coronavirus infection despite your negative nasal swab.  Please remain home and in self-quarantine for total of 10-14 days or until symptoms have significantly improved and you do not have body aches.  I have ordered your antibody test - have this drawn in about 2 weeks.  2. Labs are placed for checking other causes of abdominal pain.  Please schedule a follow-up appointment with Cassell Smiles, AGNP. Return 5-7 days if symptoms worsen or fail to improve.  If you have any other questions or concerns, please feel free to call the clinic or send a message through [place]. You may also schedule an earlier appointment if necessary.  You will receive a survey after today's visit either digitally by e-mail or paper by [employer]. Your experiences and feedback matter to [place].  Please respond so we know how we are doing as we provide care for you.  Cassell Smiles, DNP, AGNP-BC Adult Gerontology Nurse Practitioner [facility], [place]      Person Under Monitoring Name: Terri Gamble  Location: [address] [place] [id number]   Infection Prevention Recommendations for Individuals Confirmed to have, or Being Evaluated for, 2019 Novel Coronavirus (COVID-19) Infection Who Receive Care at Home  Individuals who are confirmed to have, or are being evaluated for, COVID-19 should follow the prevention steps below until a healthcare provider or local or state health department says they can return to normal activities.  Stay home except to get medical care You should restrict activities outside your home, except for getting medical care. Do not go to work, school, or public areas, and do not use public transportation or taxis.  Call ahead before visiting your doctor Before your medical appointment, call the healthcare provider and tell them that you have, or are being evaluated  for, COVID-19 infection. This will help the healthcare provider's office take steps to keep other people from getting infected. Ask your healthcare provider to call the local or state health department.  Monitor your symptoms Seek prompt medical attention if your illness is worsening (e.g., difficulty breathing). Before going to your medical appointment, call the healthcare provider and tell them that you have, or are being evaluated for, COVID-19 infection. Ask your healthcare provider to call the local or state health department.  Wear a facemask You should wear a facemask that covers your nose and mouth when you are in the same room with other people and when you visit a healthcare provider. People who live with or visit you should also wear a facemask while they are in the same room with you.  Separate yourself from other people in your home As much as possible, you should stay in a different room from other people in your home. Also, you should use a separate bathroom, if available.  Avoid sharing household items You should not share dishes, drinking glasses, cups, eating utensils, towels, bedding, or other items with other people in your home. After using these items, you should wash them thoroughly with soap and water.  Cover your coughs and sneezes Cover your mouth and nose with a tissue when you cough or sneeze, or you can cough or sneeze into your sleeve. Throw used tissues in a lined trash can, and immediately wash your hands with soap and water for at least 20 seconds or use an alcohol-based hand rub.  Wash your Proofreader your  hands often and thoroughly with soap and water for at least 20 seconds. You can use an alcohol-based hand sanitizer if soap and water are not available and if your hands are not visibly dirty. Avoid touching your eyes, nose, and mouth with unwashed hands.   Prevention Steps for Caregivers and Household Members of Individuals Confirmed to have, or  Being Evaluated for, COVID-19 Infection Being Cared for in the Home  If you live with, or provide care at home for, a person confirmed to have, or being evaluated for, COVID-19 infection please follow these guidelines to prevent infection:  Follow healthcare provider's instructions Make sure that you understand and can help the patient follow any healthcare provider instructions for all care.  Provide for the patient's basic needs You should help the patient with basic needs in the home and provide support for getting groceries, prescriptions, and other personal needs.  Monitor the patient's symptoms If they are getting sicker, call his or her medical provider and tell them that the patient has, or is being evaluated for, COVID-19 infection. This will help the healthcare provider's office take steps to keep other people from getting infected. Ask the healthcare provider to call the local or state health department.  Limit the number of people who have contact with the patient  If possible, have only one caregiver for the patient.  Other household members should stay in another home or place of residence. If this is not possible, they should stay  in another room, or be separated from the patient as much as possible. Use a separate bathroom, if available.  Restrict visitors who do not have an essential need to be in the home.  Keep older adults, very young children, and other sick people away from the patient Keep older adults, very young children, and those who have compromised immune systems or chronic health conditions away from the patient. This includes people with chronic heart, lung, or kidney conditions, diabetes, and cancer.  Ensure good ventilation Make sure that shared spaces in the home have good air flow, such as from an air conditioner or an opened window, weather permitting.  Wash your hands often  Wash your hands often and thoroughly with soap and water for at least  20 seconds. You can use an alcohol based hand sanitizer if soap and water are not available and if your hands are not visibly dirty.  Avoid touching your eyes, nose, and mouth with unwashed hands.  Use disposable paper towels to dry your hands. If not available, use dedicated cloth towels and replace them when they become wet.  Wear a facemask and gloves  Wear a disposable facemask at all times in the room and gloves when you touch or have contact with the patient's blood, body fluids, and/or secretions or excretions, such as sweat, saliva, sputum, nasal mucus, vomit, urine, or feces.  Ensure the mask fits over your nose and mouth tightly, and do not touch it during use.  Throw out disposable facemasks and gloves after using them. Do not reuse.  Wash your hands immediately after removing your facemask and gloves.  If your personal clothing becomes contaminated, carefully remove clothing and launder. Wash your hands after handling contaminated clothing.  Place all used disposable facemasks, gloves, and other waste in a lined container before disposing them with other household waste.  Remove gloves and wash your hands immediately after handling these items.  Do not share dishes, glasses, or other household items with the patient  Avoid  sharing household items. You should not share dishes, drinking glasses, cups, eating utensils, towels, bedding, or other items with a patient who is confirmed to have, or being evaluated for, COVID-19 infection.  After the person uses these items, you should wash them thoroughly with soap and water.  Wash laundry thoroughly  Immediately remove and wash clothes or bedding that have blood, body fluids, and/or secretions or excretions, such as sweat, saliva, sputum, nasal mucus, vomit, urine, or feces, on them.  Wear gloves when handling laundry from the patient.  Read and follow directions on labels of laundry or clothing items and detergent. In general,  wash and dry with the warmest temperatures recommended on the label.  Clean all areas the individual has used often  Clean all touchable surfaces, such as counters, tabletops, doorknobs, bathroom fixtures, toilets, phones, keyboards, tablets, and bedside tables, every day. Also, clean any surfaces that may have blood, body fluids, and/or secretions or excretions on them.  Wear gloves when cleaning surfaces the patient has come in contact with.  Use a diluted bleach solution (e.g., dilute bleach with 1 part bleach and 10 parts water) or a household disinfectant with a label that says EPA-registered for coronaviruses. To make a bleach solution at home, add 1 tablespoon of bleach to 1 quart (4 cups) of water. For a larger supply, add  cup of bleach to 1 gallon (16 cups) of water.  Read labels of cleaning products and follow recommendations provided on product labels. Labels contain instructions for safe and effective use of the cleaning product including precautions you should take when applying the product, such as wearing gloves or eye protection and making sure you have good ventilation during use of the product.  Remove gloves and wash hands immediately after cleaning.  Monitor yourself for signs and symptoms of illness Caregivers and household members are considered close contacts, should monitor their health, and will be asked to limit movement outside of the home to the extent possible. Follow the monitoring steps for close contacts listed on the symptom monitoring form.   ? If you have additional questions, contact your local health department or call the epidemiologist on call at [phone] (available 24/7). ? This guidance is subject to change. For the most up-to-date guidance from [facility], please refer to their website: [email or web address]

## 2019-04-29 NOTE — Progress Notes (Signed)
Telemedicine Encounter: Disclosed to patient at start of encounter that we will provide appropriate telemedicine services.  Patient consents to be treated via phone prior to discussion. - Patient is at her home and is accessed via telephone. - Services are provided by Cassell Smiles from [facility].   Subjective:    Patient ID: Terri Gamble, female    DOB: 02-14-1961, 58 y.o.   MRN: [id number]  Terri Gamble is a 58 y.o. female presenting on 04/29/2019 for Chills (fever 100.3-102., chills x 4 days ago that subsided. , bodyaches, stuffy nose  and cough. x 6 days )  HPI    Fever and chills have resolved. Patient continues having body aches, stuffy nose, cough.  Symptoms continue to improve.  Patient was taking 2 8hr tylenol every 8 hrs.  Now only 1 1-2 times per day.  Main symptom now is nasal drainage/congestion, rhinorrhea.  Mucous in back of throat.  Patient is clearing throat.   Fever and chills Sat-Mon were horrible.    Patient is seeing gastroenterologist for symptoms prior to Saturday.  Tuesday 8/11 she had back pain unilaterally, bottom of stomach pain, nausea.  Dry heaves without vomiting.  This is not similar to her usual sick to stomach.  Patient has history of pancreatitis about 3 years ago acute onset/resolving.  Negative nasal swab for Covid.  Social History   Tobacco Use  . Smoking status: Former Smoker    Packs/day: 0.50    Years: 15.00    Pack years: 7.50    Types: Cigarettes    Quit date: 09/08/2004    Years since quitting: 14.6  . Smokeless tobacco: Never Used  Substance Use Topics  . Alcohol use: Yes    Alcohol/week: 0.0 standard drinks    Comment: 1 drink a month  . Drug use: No    Review of Systems Per HPI unless specifically indicated above     Objective:    There were no vitals taken for this visit.  Wt Readings from Last 3 Encounters:  10/18/18 212 lb 6.4 oz (96.3 kg)  06/23/18 216 lb 12.8 oz (98.3 kg)  06/22/17  260 lb 6 oz (118.1 kg)    Physical Exam Patient remotely monitored.  Verbal communication appropriate.  Cognition normal.   Results for orders placed or performed in visit on 04/25/19  Novel Coronavirus, NAA ([facility])   Specimen: Oropharyngeal(OP) collection in vial transport medium   OROPHARYNGEA  TESTING  Result Value Ref Range   SARS-CoV-2, NAA Not Detected Not Detected      Assessment & Plan:   Problem List Items Addressed This Visit    None    Visit Diagnoses    Suspected Covid-19 Virus Infection    -  Primary   Left upper quadrant pain       Relevant Orders   SARS-CoV-2 Antibody, IgM   Amylase   Lipase   Comprehensive metabolic panel   CBC with Differential/Platelet    Patient's symptoms most consistent with Covid-19 infection despite negative nasal swab.  Cannot exclude other viral URI with pancreatitis given past history of pancreatitis.  - Instructed patient to continue quarantine until symptoms are much better and nearly fully resolved with percentage of false negative tests and possibility of infection despite testing result - Labs today for stomach pain - Follow-up for antibody lab in about 1-2 weeks - Mucinex and atrovent nasal spray for nasal congestion/rhinorrhea - Follow-up with [place] in next 5-7 days if symptoms are  not improving   Meds ordered this encounter  Medications  . ipratropium (ATROVENT) 0.06 % nasal spray    Sig: Place 2 sprays into both nostrils 4 (four) times daily.    Dispense:  15 mL    Refill:  12    Order Specific Question:   Supervising Provider    Answer:   Olin Hauser [[id number]]    - Time spent in direct consultation with patient via telemedicine about above concerns: 10 minutes  Follow up plan: Return 5-7 days if symptoms worsen or fail to improve.  Cassell Smiles, DNP, AGPCNP-BC Adult Gerontology Primary Care Nurse Practitioner [facility] 04/29/2019, 9:27 AM

## 2019-05-01 ENCOUNTER — Encounter: Payer: Self-pay | Admitting: Nurse Practitioner

## 2019-08-03 ENCOUNTER — Encounter: Payer: Self-pay | Admitting: Family Medicine

## 2019-08-03 LAB — CBC AND DIFFERENTIAL
HCT: 42 (ref 36–46)
Hemoglobin: 14.1 (ref 12.0–16.0)
Neutrophils Absolute: 6
Platelets: 491 — AB (ref 150–399)
WBC: 9.2

## 2019-08-03 LAB — VITAMIN D 25 HYDROXY (VIT D DEFICIENCY, FRACTURES): Vit D, 25-Hydroxy: 23.9

## 2019-08-03 LAB — COMPREHENSIVE METABOLIC PANEL
Albumin: 4.4 (ref 3.5–5.0)
Calcium: 10.3 (ref 8.7–10.7)

## 2019-08-03 LAB — BASIC METABOLIC PANEL
BUN: 19 (ref 4–21)
CO2: 23 — AB (ref 13–22)
Chloride: 100 (ref 99–108)
Creatinine: 0.9 (ref <=1.1)
Glucose: 83
Potassium: 4.6 (ref 3.4–5.3)
Sodium: 138 (ref 137–147)

## 2019-08-03 LAB — LIPID PANEL
Cholesterol: 205 — AB (ref 0–200)
HDL: 66 (ref 35–70)
LDL Cholesterol: 123
Triglycerides: 91 (ref 40–160)

## 2019-08-03 LAB — TSH: TSH: 0.57 (ref 0.41–5.90)

## 2019-08-03 LAB — HEPATIC FUNCTION PANEL
ALT: 16 (ref 7–35)
AST: 18 (ref 13–35)
Alkaline Phosphatase: 130 — AB (ref 25–125)
Bilirubin, Total: 0.2

## 2019-08-03 LAB — CBC: RBC: 4.76 (ref 3.87–5.11)

## 2019-08-08 ENCOUNTER — Encounter: Payer: Self-pay | Admitting: Family Medicine

## 2019-08-25 ENCOUNTER — Other Ambulatory Visit: Payer: Self-pay | Admitting: Nurse Practitioner

## 2019-08-25 DIAGNOSIS — I10 Essential (primary) hypertension: Secondary | ICD-10-CM

## 2019-08-25 MED ORDER — LISINOPRIL 10 MG PO TABS: 10.0000 mg | ORAL_TABLET | Freq: Every day | ORAL | 0 refills | Status: DC

## 2019-08-25 NOTE — Telephone Encounter (Signed)
Pt called requesting refill on lisinopril

## 2019-09-12 ENCOUNTER — Ambulatory Visit: Payer: Managed Care, Other (non HMO) | Attending: Internal Medicine

## 2019-09-12 DIAGNOSIS — Z20822 Contact with and (suspected) exposure to covid-19: Secondary | ICD-10-CM

## 2019-09-14 LAB — NOVEL CORONAVIRUS, NAA: SARS-CoV-2, NAA: NOT DETECTED

## 2019-09-15 ENCOUNTER — Ambulatory Visit (INDEPENDENT_AMBULATORY_CARE_PROVIDER_SITE_OTHER): Payer: Managed Care, Other (non HMO) | Admitting: Physician Assistant

## 2019-09-15 ENCOUNTER — Other Ambulatory Visit: Payer: Self-pay

## 2019-09-15 ENCOUNTER — Encounter: Payer: Self-pay | Admitting: Physician Assistant

## 2019-09-15 DIAGNOSIS — R059 Cough, unspecified: Secondary | ICD-10-CM

## 2019-09-15 DIAGNOSIS — Z20822 Contact with and (suspected) exposure to covid-19: Secondary | ICD-10-CM | POA: Diagnosis not present

## 2019-09-15 DIAGNOSIS — R05 Cough: Secondary | ICD-10-CM

## 2019-09-15 NOTE — Progress Notes (Signed)
   Subjective:    Patient ID: Terri Gamble, female    DOB: 1961/03/26, 59 y.o.   MRN: [id number]  YNEZ ILIC is a 59 y.o. female presenting on 09/15/2019 for Generalized Body Aches (pt was around her son that tested positive COVID. Sore thoart, nasal congestion, bodyaches, severe headache and cough. x 2 days. The patient was tested 3 days after her exposure and test results came back negative)  Virtual Visit via Telephone Note  I connected with Terri Gamble on 09/15/19 at  3:40 PM EST by telephone and verified that I am speaking with the correct person using two identifiers.   I discussed the limitations, risks, security and privacy concerns of performing an evaluation and management service by telephone and the availability of in person appointments. I also discussed with the patient that there may be a patient responsible charge related to this service. The patient expressed understanding and agreed to proceed.  HPI   Patient was exposed to COVID + son on 09/08/2019 who was having body aches at that time. Patient's son and wife both tested positive. Patient was around son and not wearing a mask and indoors for a period of few hours. Patient was tested on 09/11/2018 for COVID and was negative. She reports last three days she has a severe headache and nasal congestion and bad sore throat. She was not having symptoms when she was tested. She did have some slight chest pains yesterday for 30-45 minutes which did resolve. She denies SOB. She also has a cough and sore throat.   Social History   Tobacco Use  . Smoking status: Former Smoker    Packs/day: 0.50    Years: 15.00    Pack years: 7.50    Types: Cigarettes    Quit date: 09/08/2004    Years since quitting: 15.0  . Smokeless tobacco: Never Used  Substance Use Topics  . Alcohol use: Yes    Alcohol/week: 0.0 standard drinks    Comment: 1 drink a month  . Drug use: No    Review of Systems Per HPI unless specifically  indicated above     Objective:    There were no vitals taken for this visit.  Wt Readings from Last 3 Encounters:  10/18/18 212 lb 6.4 oz (96.3 kg)  06/23/18 216 lb 12.8 oz (98.3 kg)  06/22/17 260 lb 6 oz (118.1 kg)    Physical Exam Results for orders placed or performed in visit on 09/12/19  Novel Coronavirus, NAA ([facility])   Specimen: Nasopharyngeal(NP) swabs in vial transport medium   NASOPHARYNGE  TESTING  Result Value Ref Range   SARS-CoV-2, NAA Not Detected Not Detected      Assessment & Plan:  1. Cough  Advised patient due to close exposure to assume she is positive. Test is likely false negative due to testing too early. Will quarantine from date of first symptoms or 09/11/2018. Work note provided. Return precautions counseled.  2. Close exposure to COVID-19 virus   Follow up PRN  Carles Collet, PA-C [facility] 09/15/2019, 3:45 PM

## 2019-09-22 ENCOUNTER — Other Ambulatory Visit: Payer: Self-pay

## 2019-09-22 ENCOUNTER — Encounter: Payer: Self-pay | Admitting: Physician Assistant

## 2019-09-22 ENCOUNTER — Ambulatory Visit (INDEPENDENT_AMBULATORY_CARE_PROVIDER_SITE_OTHER): Payer: Managed Care, Other (non HMO) | Admitting: Physician Assistant

## 2019-09-22 DIAGNOSIS — J0110 Acute frontal sinusitis, unspecified: Secondary | ICD-10-CM

## 2019-09-22 MED ORDER — AMOXICILLIN 875 MG PO TABS: 875.0000 mg | ORAL_TABLET | Freq: Two times a day (BID) | ORAL | 0 refills | Status: AC

## 2019-09-22 NOTE — Progress Notes (Signed)
   Subjective:    Patient ID: Terri Gamble, female    DOB: [birth year], 59 y.o.   MRN: [id number]  Terri Gamble is a 59 y.o. female presenting on 09/22/2019 for Headache (productive coughing, a lot of mucus producting that makes it hard to swallow. Persistent coughing, diarrhea )  Virtual Visit via Telephone Note  I connected with Terri Gamble on 09/22/19 at  4:00 PM EST by telephone and verified that I am speaking with the correct person using two identifiers.   I discussed the limitations, risks, security and privacy concerns of performing an evaluation and management service by telephone and the availability of in person appointments. I also discussed with the patient that there may be a patient responsible charge related to this service. The patient expressed understanding and agreed to proceed.  HPI   Patient presents today with nasal congestion and head pressure x 10 days and worsening. She was exposed to her COVID positive son 12/3/12020 and was treated empirically as if she had COVID despite negative test 09/11/2018 due to persistent symptoms. Denies fevers and SOB.   Social History   Tobacco Use  . Smoking status: Former Smoker    Packs/day: 0.50    Years: 15.00    Pack years: 7.50    Types: Cigarettes    Quit date: 09/08/2004    Years since quitting: 15.0  . Smokeless tobacco: Never Used  Substance Use Topics  . Alcohol use: Yes    Alcohol/week: 0.0 standard drinks    Comment: 1 drink a month  . Drug use: No    Review of Systems Per HPI unless specifically indicated above     Objective:    There were no vitals taken for this visit.  Wt Readings from Last 3 Encounters:  10/18/18 212 lb 6.4 oz (96.3 kg)  06/23/18 216 lb 12.8 oz (98.3 kg)  06/22/17 260 lb 6 oz (118.1 kg)    Physical Exam Results for orders placed or performed in visit on 09/12/19  Novel Coronavirus, NAA ([facility])   Specimen: Nasopharyngeal(NP) swabs in vial transport medium   NASOPHARYNGE  TESTING  Result Value Ref Range   SARS-CoV-2, NAA Not Detected Not Detected      Assessment & Plan:  1. Acute non-recurrent frontal sinusitis  Treat as below for bacterial sinusitis and can use Mucinex for symptoms.  - amoxicillin (AMOXIL) 875 MG tablet; Take 1 tablet (875 mg total) by mouth 2 (two) times daily for 10 days.  Dispense: 20 tablet; Refill: 0     Follow up plan: No follow-ups on file.  Carles Collet, PA-C [facility] 09/22/2019, 4:09 PM

## 2019-09-22 NOTE — Patient Instructions (Signed)

## 2019-10-18 ENCOUNTER — Encounter: Payer: Self-pay | Admitting: Family Medicine

## 2019-10-18 ENCOUNTER — Other Ambulatory Visit: Payer: Self-pay

## 2019-10-18 ENCOUNTER — Ambulatory Visit (INDEPENDENT_AMBULATORY_CARE_PROVIDER_SITE_OTHER): Payer: Managed Care, Other (non HMO) | Admitting: Family Medicine

## 2019-10-18 DIAGNOSIS — F419 Anxiety disorder, unspecified: Secondary | ICD-10-CM

## 2019-10-18 DIAGNOSIS — F5104 Psychophysiologic insomnia: Secondary | ICD-10-CM | POA: Diagnosis not present

## 2019-10-18 MED ORDER — ESCITALOPRAM OXALATE 10 MG PO TABS: 10.0000 mg | ORAL_TABLET | Freq: Every day | ORAL | 2 refills | Status: DC

## 2019-10-18 NOTE — Progress Notes (Signed)
Virtual Visit via Telephone The purpose of this virtual visit is to provide medical care while limiting exposure to the novel coronavirus (COVID19) for both patient and office staff.  Consent was obtained for phone visit:  Yes.   Answered questions that patient had about telehealth interaction:  Yes.   I discussed the limitations, risks, security and privacy concerns of performing an evaluation and management service by telephone. I also discussed with the patient that there may be a patient responsible charge related to this service. The patient expressed understanding and agreed to proceed.  Patient Location: Home Provider Location: [facility])  ---------------------------------------------------------------------- Chief Complaint  Patient presents with  . Anxiety   Previous PCP Cassell Smiles, AGPCNP-BC   S: Reviewed CMA documentation. I have called patient and gathered additional HPI as follows:  Anxiety / Insomnia Reports that symptoms started over past several months, new issue for her with anxiety and secondary problem now with insomnia affecting her. She has no significant past history of anxiety, never on medication. - One primary stressor for her is related to her job / work related stress issue with [employer] and she has gone to HR, she is concerned about future of her job. She admits other life stressors with family stressors and custody. Limitations with COVID contributing, also gained weight, unable to go to gym. - Regular sleep pattern is bedtime 06-1029 pm, she often can fall asleep prior to bed on couch for 30 min then do some activity then go to bed. She woke up with panic attack and then couldn't go back to sleep. - She sleeps with ceiling fan on all the time. Occasionally turns TV on. If wakes up will turn TV off. - Admits a panic attack this morning when woke up, she does admit having history of prior panic attacks in past, and she has learned to  control them without medication. She describes "walls closing in" at times with it. - Tried OTC Advil PM occasionally, some groggy next day feeling. Tried Benadryl in past has opposite. - Dietary caffeine - 12 oz coffee in AM, diet mt dew - No prior history of depression or mood disorder. Denies depression currently. - History of RLS and leg cramps at times. - She has some residual sinus congestion, recent sinus infection. - Friend recommended Lexapro and she wants to try this but usually doesn't like medicines   Denies any high risk travel to areas of current concern for COVID19. Denies any known or suspected exposure to person with or possibly with COVID19.  Denies any fevers, chills, sweats, body ache, cough, shortness of breath, sinus pain or pressure, headache, abdominal pain, diarrhea  Past Medical History:  Diagnosis Date  . Hypertension   . Seasonal allergies    Social History   Tobacco Use  . Smoking status: Former Smoker    Packs/day: 0.50    Years: 15.00    Pack years: 7.50    Types: Cigarettes    Quit date: 09/08/2004    Years since quitting: 15.1  . Smokeless tobacco: Never Used  Substance Use Topics  . Alcohol use: Yes    Alcohol/week: 0.0 standard drinks    Comment: 1 drink a month  . Drug use: No    Current Outpatient Medications:  .  Biotin 3 MG TABS, Take by mouth., Disp: , Rfl:  .  Cholecalciferol (VITAMIN D3) 25 MCG (1000 UT) CAPS, Take 4,000 Units by mouth., Disp: , Rfl:  .  ibuprofen (ADVIL,MOTRIN) 200  MG tablet, Take 400 mg by mouth 2 (two) times daily. , Disp: , Rfl:  .  ipratropium (ATROVENT) 0.06 % nasal spray, Place 2 sprays into both nostrils 4 (four) times daily., Disp: 15 mL, Rfl: 12 .  lisinopril (ZESTRIL) 10 MG tablet, Take 1 tablet (10 mg total) by mouth daily., Disp: 90 tablet, Rfl: 0 .  valACYclovir (VALTREX) 500 MG tablet, Take 500 mg by mouth 2 (two) times daily as needed (for fever blisters)., Disp: , Rfl:  .  escitalopram (LEXAPRO) 10  MG tablet, Take 1 tablet (10 mg total) by mouth daily., Disp: 30 tablet, Rfl: 2 .  Multiple Vitamins-Minerals (MULTIVITAMIN WOMEN PO), Take 1 tablet by mouth daily., Disp: , Rfl:   Depression screen [facility] 2/9 10/18/2019 01/05/2019 06/23/2018  Decreased Interest 0 0 0  Down, Depressed, Hopeless 1 0 0  PHQ - 2 Score 1 0 0  Altered sleeping 3 - -  Tired, decreased energy 3 - -  Change in appetite 1 - -  Feeling bad or failure about yourself  1 - -  Trouble concentrating 1 - -  Moving slowly or fidgety/restless 0 - -  Suicidal thoughts 0 - -  PHQ-9 Score 10 - -  Difficult doing work/chores Not difficult at all - -    GAD 7 : Generalized Anxiety Score 10/18/2019 06/09/2017  Nervous, Anxious, on Edge 3 2  Control/stop worrying 3 0  Worry too much - different things 3 0  Trouble relaxing 2 2  Restless 1 1  Easily annoyed or irritable 1 2  Afraid - awful might happen 1 0  Total GAD 7 Score 14 7  Anxiety Difficulty Not difficult at all Somewhat difficult    -------------------------------------------------------------------------- O: No physical exam performed due to remote telephone encounter.  Lab results reviewed.  Recent Results (from the past 2160 hour(s))  CBC and differential     Status: Abnormal   Collection Time: 08/03/19 12:00 AM  Result Value Ref Range   Hemoglobin 14.1 12.0 - 16.0   HCT 42 36 - 46   Neutrophils Absolute 6    Platelets 491 (A) 150 - 399   WBC 9.2   CBC     Status: None   Collection Time: 08/03/19 12:00 AM  Result Value Ref Range   RBC 4.76 3.87 - 5.11  VITAMIN D 25 Hydroxy (Vit-D Deficiency, Fractures)     Status: None   Collection Time: 08/03/19 12:00 AM  Result Value Ref Range   Vit D, 25-Hydroxy [id number]   Basic metabolic panel     Status: Abnormal   Collection Time: 08/03/19 12:00 AM  Result Value Ref Range   Glucose 83    BUN 19 4 - 21   CO2 23 (A) 13 - 22   Creatinine 0.9 0.5 - 1.1   Potassium 4.6 3.4 - 5.3   Sodium 138 137 - 147   Chloride  100 99 - 108  Comprehensive metabolic panel     Status: None   Collection Time: 08/03/19 12:00 AM  Result Value Ref Range   Calcium 10.3 8.7 - 10.7   Albumin 4.4 3.5 - 5.0  Lipid panel     Status: Abnormal   Collection Time: 08/03/19 12:00 AM  Result Value Ref Range   Triglycerides 91 40 - 160   Cholesterol 205 (A) 0 - 200   HDL 66 35 - 70   LDL Cholesterol 123   Hepatic function panel     Status: Abnormal  Collection Time: 08/03/19 12:00 AM  Result Value Ref Range   Alkaline Phosphatase 130 (A) 25 - 125   ALT 16 7 - 35   AST 18 13 - 35   Bilirubin, Total 0.2   TSH     Status: None   Collection Time: 08/03/19 12:00 AM  Result Value Ref Range   TSH 0.57 0.41 - 5.90  Novel Coronavirus, NAA ([facility])     Status: None   Collection Time: 09/12/19  2:03 PM   Specimen: Nasopharyngeal(NP) swabs in vial transport medium   NASOPHARYNGE  TESTING  Result Value Ref Range   SARS-CoV-2, NAA Not Detected Not Detected    Comment: This nucleic acid amplification test was developed and its performance characteristics determined by [employer]. Nucleic acid amplification tests include PCR and TMA. This test has not been FDA cleared or approved. This test has been authorized by FDA under an Emergency Use Authorization (EUA). This test is only authorized for the duration of time the declaration that circumstances exist justifying the authorization of the emergency use of in vitro diagnostic tests for detection of SARS-CoV-2 virus and/or diagnosis of COVID-19 infection under section 564(b)(1) of the Act, 21 U.S.C. [id number]) (1), unless the authorization is terminated or revoked sooner. When diagnostic testing is negative, the possibility of a false negative result should be considered in the context of a patient's recent exposures and the presence of clinical signs and symptoms consistent with COVID-19. An individual without symptoms of COVID-19 and who is not shedding SARS-CoV-2  virus would  expect to have a negative (not detected) result in this assay.     -------------------------------------------------------------------------- A&P:  Problem List Items Addressed This Visit    None    Visit Diagnoses    Anxiety    -  Primary   Relevant Medications   escitalopram (LEXAPRO) 10 MG tablet   Psychophysiological insomnia       Relevant Medications   escitalopram (LEXAPRO) 10 MG tablet     Suspected anxiety generalized may be more acute adjustment due to multiple significant life stressors No comorbid depression Secondary insomnia See above GAD PHQ  Plan: 1. Discussion on new diagnosis anxiety, management, complications, likely contributing to insomnia 2. Start Escitalopram 10mg  daily AM with food, counseling on potential side effects risks, reviewed possible GI intolerance, insomnia (although likely to improve this given anxiety likely source of insomnia), - anticipate 4-6 weeks for notable effect, may need titrate dose to 20 in future - Add melatonin 1-5mg  nightly dose adjust - Reviewed Sleep Hygiene, advised several points to improve this - avoid screen before bed, avoid accidental nap before bed, turn TV off, limit caffeine in afternoon Follow-up 4-6 weeks anxiety, med adjust, GAD7/PHQ9   Meds ordered this encounter  Medications  . escitalopram (LEXAPRO) 10 MG tablet    Sig: Take 1 tablet (10 mg total) by mouth daily.    Dispense:  30 tablet    Refill:  2    Follow-up: - Return in 4 weeks as needed if not improve anxiety / insomnia w/ new provider  Patient verbalizes understanding with the above medical recommendations including the limitation of remote medical advice.  Specific follow-up and call-back criteria were given for patient to follow-up or seek medical care more urgently if needed.   - Time spent in direct consultation with patient on phone: 20 minutes  Nobie Putnam, [facility] 10/18/2019, 3:13 PM

## 2019-10-18 NOTE — Patient Instructions (Addendum)
As discussed, it sounds like your symptoms are primarily related to anxiety / insomnia disorder. This is a very common problem and be related to several factors, including life stressors. Start treatment with Escitalopram (generic lexapro), take 10mg  daily for now. As discussed most anxiety medications are also used for mood disorders such as depression, because they work on similar chemicals in your brain. It may take up to 3-4 weeks for the medicine to take full effect and for you to notice a difference, sometimes you may notice it working sooner, otherwise we may need to adjust the dose.  Likely take Lexapro for up to 3-6 months then can taper off when done.  Also can try melatonin 1mg  nightly as a natural supplement - increase by 1 every 2-3 days up to dose of 5mg  if need   Sleep Hygiene Recommendations to promote healthy sleep in all patients, especially if symptoms of insomnia are worsening. Due to the nature of sleep rhythms, if your body gets "out of rhythm", it may take some time before your sleep cycle can be "reset".  Please try to follow as many of the following tips as you can, usually there are only a few of these are the primary cause of the problem.  ?To reset your sleep rhythm, go to bed and get up at the same time every day ?Sleep only long enough to feel rested and then get out of bed ?Do not try to force yourself to sleep. If you can't sleep, get out of bed and try again later. ?Avoid naps during the day, unless excessively tired. The more sleeping during the day, then the less sleep your body needs at night.  ?Have coffee, tea, and other foods that have caffeine only in the morning ?Exercise several days a week, but not right before bed ?If you drink alcohol, prefer to have appropriate drink with one meal, but prefer to avoid alcohol in the evening, and bedtime ?If you smoke, avoid smoking, especially in the evening  ?Avoid watching TV or looking at phones, computers, or  reading devices ("e-books") that give off light at least 30 minutes before bed. This artificial light sends "awake signals" to your brain and can make it harder to fall asleep. ?Make your bedroom a comfortable place where it is easy to fall asleep: ? Put up shades or special blackout curtains to block light from outside. ? Use a white noise machine to block noise. ? Keep the temperature cool. ?Try your best to solve or at least address your problems before you go to bed ?Use relaxation techniques to manage stress. Ask your health care provider to suggest some techniques that may work well for you. These may include: ? Breathing exercises. ? Routines to release muscle tension. ? Visualizing peaceful scenes.   Please schedule a Follow-up Appointment to: Return in about 4 weeks (around 11/15/2019), or if symptoms worsen or fail to improve, for anxiety / insomnia.  If you have any other questions or concerns, please feel free to call the office or send a message through [place]. You may also schedule an earlier appointment if necessary.  Additionally, you may be receiving a survey about your experience at our office within a few days to 1 week by e-mail or mail. We value your feedback.  Nobie Putnam, DO [place]

## 2019-11-11 ENCOUNTER — Ambulatory Visit: Payer: Managed Care, Other (non HMO) | Attending: Internal Medicine

## 2019-11-11 DIAGNOSIS — Z23 Encounter for immunization: Secondary | ICD-10-CM | POA: Insufficient documentation

## 2019-11-11 NOTE — Progress Notes (Signed)
   Covid-19 Vaccination Clinic  Name:  MURIEL BABERS    MRN: [id number] DOB: 1961/07/06  11/11/2019  Ms. Donati was observed post Covid-19 immunization for 15 minutes without incident. She was provided with Vaccine Information Sheet and instruction to access the V-Safe system.   Ms. Kribs was instructed to call 911 with any severe reactions post vaccine: Marland Kitchen Difficulty breathing  . Swelling of face and throat  . A fast heartbeat  . A bad rash all over body  . Dizziness and weakness   Immunizations Administered    Name Date Dose VIS Date Route   Pfizer COVID-19 Vaccine 11/11/2019 11:53 AM 0.3 mL 08/19/2019 Intramuscular   Manufacturer: [place]   Lot: [id number]   [place]: [id number]

## 2019-12-02 ENCOUNTER — Ambulatory Visit: Payer: Managed Care, Other (non HMO) | Attending: Internal Medicine

## 2019-12-02 DIAGNOSIS — Z23 Encounter for immunization: Secondary | ICD-10-CM

## 2019-12-02 NOTE — Progress Notes (Signed)
   Covid-19 Vaccination Clinic  Name:  Terri Gamble    MRN: [id number] DOB: 02-19-1961  12/02/2019  [name] was observed post Covid-19 immunization for 15 minutes without incident. She was provided with Vaccine Information Sheet and instruction to access the V-Safe system.   [name] was instructed to call 911 with any severe reactions post vaccine: Marland Kitchen Difficulty breathing  . Swelling of face and throat  . A fast heartbeat  . A bad rash all over body  . Dizziness and weakness   Immunizations Administered    Name Date Dose VIS Date Route   Pfizer COVID-19 Vaccine 12/02/2019  9:39 AM 0.3 mL 08/19/2019 Intramuscular   Manufacturer: [place]   Lot: [id number]   [place]: [id number]

## 2020-05-18 ENCOUNTER — Other Ambulatory Visit: Payer: Self-pay | Admitting: Family Medicine

## 2020-05-18 ENCOUNTER — Other Ambulatory Visit: Payer: Self-pay

## 2020-05-18 ENCOUNTER — Encounter: Payer: Self-pay | Admitting: Family Medicine

## 2020-05-18 ENCOUNTER — Telehealth (INDEPENDENT_AMBULATORY_CARE_PROVIDER_SITE_OTHER): Payer: Managed Care, Other (non HMO) | Admitting: Family Medicine

## 2020-05-18 VITALS — Temp 97.7°F

## 2020-05-18 DIAGNOSIS — I10 Essential (primary) hypertension: Secondary | ICD-10-CM

## 2020-05-18 DIAGNOSIS — J019 Acute sinusitis, unspecified: Secondary | ICD-10-CM | POA: Diagnosis not present

## 2020-05-18 MED ORDER — GUAIFENESIN ER 600 MG PO TB12: 1200.0000 mg | ORAL_TABLET | Freq: Two times a day (BID) | ORAL | 0 refills | Status: AC

## 2020-05-18 MED ORDER — PROMETHAZINE-DM 6.25-15 MG/5ML PO SYRP: 5.0000 mL | ORAL_SOLUTION | Freq: Four times a day (QID) | ORAL | 0 refills | Status: DC | PRN

## 2020-05-18 MED ORDER — AMOXICILLIN-POT CLAVULANATE 875-125 MG PO TABS: 1.0000 | ORAL_TABLET | Freq: Two times a day (BID) | ORAL | 0 refills | Status: DC

## 2020-05-18 NOTE — Progress Notes (Signed)
Virtual Visit via Telephone  The purpose of this virtual visit is to provide medical care while limiting exposure to the novel coronavirus (COVID19) for both patient and office staff.  Consent was obtained for phone visit:  Yes.   Answered questions that patient had about telehealth interaction:  Yes.   I discussed the limitations, risks, security and privacy concerns of performing an evaluation and management service by telephone. I also discussed with the patient that there may be a patient responsible charge related to this service. The patient expressed understanding and agreed to proceed.  Patient is at home and is accessed via telephone Services are provided by Harlin Rain, FNP-C from [facility])  ---------------------------------------------------------------------- Chief Complaint  Patient presents with  . URI    post nasal drainage, left sore throat, left ear pain and swollen lymph nodes only on the left side and headache x 5 days     S: Reviewed CMA documentation. I have called patient and gathered additional HPI as follows:  Terri Gamble presents for telemedicine visit for concerns of post nasal drainage, left sided sore throat, left sided ear pain, with swollen lymph nodes, headaches, sinus pressure, purulent nasal drainage, and cough that keeps her up at night.  Denies fevers, change in taste/smell, SOB, CP, abdominal pain, n/v/d.  Patient is currently home Denies any high risk travel to areas of current concern for COVID19. Denies any known or suspected exposure to person with or possibly with COVID19.  Past Medical History:  Diagnosis Date  . Hypertension   . Seasonal allergies    Social History   Tobacco Use  . Smoking status: Former Smoker    Packs/day: 0.50    Years: 15.00    Pack years: 7.50    Types: Cigarettes    Quit date: 09/08/2004    Years since quitting: 15.7  . Smokeless tobacco: Never Used  Vaping Use  . Vaping  Use: Never used  Substance Use Topics  . Alcohol use: Yes    Alcohol/week: 0.0 standard drinks    Comment: 1 drink a month  . Drug use: No    Current Outpatient Medications:  .  Biotin 3 MG TABS, Take by mouth., Disp: , Rfl:  .  cholecalciferol (VITAMIN D3) 25 MCG (1000 UNIT) tablet, Take 1,000 Units by mouth daily., Disp: , Rfl:  .  escitalopram (LEXAPRO) 10 MG tablet, Take 1 tablet (10 mg total) by mouth daily., Disp: 30 tablet, Rfl: 2 .  ibuprofen (ADVIL,MOTRIN) 200 MG tablet, Take 400 mg by mouth 2 (two) times daily. , Disp: , Rfl:  .  ipratropium (ATROVENT) 0.06 % nasal spray, Place 2 sprays into both nostrils 4 (four) times daily., Disp: 15 mL, Rfl: 12 .  lisinopril (ZESTRIL) 10 MG tablet, Take 1 tablet (10 mg total) by mouth daily., Disp: 90 tablet, Rfl: 0 .  valACYclovir (VALTREX) 500 MG tablet, Take 500 mg by mouth 2 (two) times daily as needed (for fever blisters)., Disp: , Rfl:  .  amoxicillin-clavulanate (AUGMENTIN) 875-125 MG tablet, Take 1 tablet by mouth 2 (two) times daily., Disp: 20 tablet, Rfl: 0 .  guaiFENesin (MUCINEX) 600 MG 12 hr tablet, Take 2 tablets (1,200 mg total) by mouth 2 (two) times daily for 10 days., Disp: 40 tablet, Rfl: 0 .  Multiple Vitamins-Minerals (MULTIVITAMIN WOMEN PO), Take 1 tablet by mouth daily. (Patient not taking: Reported on 05/18/2020), Disp: , Rfl:  .  promethazine-dextromethorphan (PROMETHAZINE-DM) 6.25-15 MG/5ML syrup, Take 5 mLs by  mouth 4 (four) times daily as needed for cough., Disp: 118 mL, Rfl: 0  Depression screen [facility] 2/9 10/18/2019 01/05/2019 06/23/2018  Decreased Interest 0 0 0  Down, Depressed, Hopeless 1 0 0  PHQ - 2 Score 1 0 0  Altered sleeping 3 - -  Tired, decreased energy 3 - -  Change in appetite 1 - -  Feeling bad or failure about yourself  1 - -  Trouble concentrating 1 - -  Moving slowly or fidgety/restless 0 - -  Suicidal thoughts 0 - -  PHQ-9 Score 10 - -  Difficult doing work/chores Not difficult at all - -     GAD 7 : Generalized Anxiety Score 10/18/2019 06/09/2017  Nervous, Anxious, on Edge 3 2  Control/stop worrying 3 0  Worry too much - different things 3 0  Trouble relaxing 2 2  Restless 1 1  Easily annoyed or irritable 1 2  Afraid - awful might happen 1 0  Total GAD 7 Score 14 7  Anxiety Difficulty Not difficult at all Somewhat difficult    -------------------------------------------------------------------------- O: No physical exam performed due to remote telephone encounter.  Physical Exam: Patient remotely monitored without video.  Verbal communication appropriate.  Cognition normal.  No results found for this or any previous visit (from the past 2160 hour(s)).  -------------------------------------------------------------------------- A&P:  Problem List Items Addressed This Visit      Respiratory   Acute non-recurrent sinusitis - Primary    Acute sinus infection based on reported symptoms and history provided.  Will treat with Augmentin 875-125mg  BID x 10 days, along with mucinex 1200mg  BID x 10 days and promethazine DM 44mL every 4-6 hours as needed for cough at night.    Plan: 1. Begin Augmentin 875-125mg  BID x 10 days 2. Begin mucinex 1200mg  BID x 10 days 3. Can take promethazine DM 12mL every 4-6 hours as needed for cough 4. RTC if symptoms worsen or fail to improve.      Relevant Medications   amoxicillin-clavulanate (AUGMENTIN) 875-125 MG tablet   guaiFENesin (MUCINEX) 600 MG 12 hr tablet   promethazine-dextromethorphan (PROMETHAZINE-DM) 6.25-15 MG/5ML syrup      Meds ordered this encounter  Medications  . amoxicillin-clavulanate (AUGMENTIN) 875-125 MG tablet    Sig: Take 1 tablet by mouth 2 (two) times daily.    Dispense:  20 tablet    Refill:  0  . guaiFENesin (MUCINEX) 600 MG 12 hr tablet    Sig: Take 2 tablets (1,200 mg total) by mouth 2 (two) times daily for 10 days.    Dispense:  40 tablet    Refill:  0  . promethazine-dextromethorphan  (PROMETHAZINE-DM) 6.25-15 MG/5ML syrup    Sig: Take 5 mLs by mouth 4 (four) times daily as needed for cough.    Dispense:  118 mL    Refill:  0    Follow-up: - Return if symptoms worsen or fail to improve with current treatment  Patient verbalizes understanding with the above medical recommendations including the limitation of remote medical advice.  Specific follow-up and call-back criteria were given for patient to follow-up or seek medical care more urgently if needed.  - Time spent in direct consultation with patient on phone: 8 minutes  Harlin Rain, [facility] 05/18/2020, 1:37 PM

## 2020-05-18 NOTE — Assessment & Plan Note (Signed)
Acute sinus infection based on reported symptoms and history provided.  Will treat with Augmentin 875-125mg  BID x 10 days, along with mucinex 1200mg  BID x 10 days and promethazine DM 74mL every 4-6 hours as needed for cough at night.    Plan: 1. Begin Augmentin 875-125mg  BID x 10 days 2. Begin mucinex 1200mg  BID x 10 days 3. Can take promethazine DM 65mL every 4-6 hours as needed for cough 4. RTC if symptoms worsen or fail to improve.

## 2020-05-30 ENCOUNTER — Other Ambulatory Visit: Payer: Managed Care, Other (non HMO)

## 2020-05-30 ENCOUNTER — Ambulatory Visit: Payer: Managed Care, Other (non HMO) | Admitting: Family Medicine

## 2020-05-30 DIAGNOSIS — Z20822 Contact with and (suspected) exposure to covid-19: Secondary | ICD-10-CM

## 2020-06-01 LAB — SARS-COV-2, NAA 2 DAY TAT

## 2020-06-01 LAB — NOVEL CORONAVIRUS, NAA: SARS-CoV-2, NAA: NOT DETECTED

## 2020-06-20 ENCOUNTER — Ambulatory Visit: Payer: Managed Care, Other (non HMO) | Admitting: Family Medicine

## 2020-06-25 ENCOUNTER — Other Ambulatory Visit: Payer: Self-pay

## 2020-06-25 ENCOUNTER — Encounter: Payer: Self-pay | Admitting: Family Medicine

## 2020-06-25 ENCOUNTER — Ambulatory Visit (INDEPENDENT_AMBULATORY_CARE_PROVIDER_SITE_OTHER): Payer: Managed Care, Other (non HMO) | Admitting: Family Medicine

## 2020-06-25 ENCOUNTER — Telehealth: Payer: Self-pay | Admitting: Family Medicine

## 2020-06-25 ENCOUNTER — Ambulatory Visit: Payer: Managed Care, Other (non HMO) | Admitting: Family Medicine

## 2020-06-25 VITALS — BP 129/85 | HR 102 | Temp 98.7°F | Resp 18 | Ht 60.5 in | Wt 240.6 lb

## 2020-06-25 DIAGNOSIS — Z79899 Other long term (current) drug therapy: Secondary | ICD-10-CM | POA: Diagnosis not present

## 2020-06-25 DIAGNOSIS — Z7689 Persons encountering health services in other specified circumstances: Secondary | ICD-10-CM | POA: Diagnosis not present

## 2020-06-25 DIAGNOSIS — R635 Abnormal weight gain: Secondary | ICD-10-CM | POA: Diagnosis not present

## 2020-06-25 DIAGNOSIS — I10 Essential (primary) hypertension: Secondary | ICD-10-CM | POA: Diagnosis not present

## 2020-06-25 NOTE — Assessment & Plan Note (Signed)
Not meeting biometric screening for employer due to BMI > 30.  Requesting appeal for biometric screening.  Interested in losing 10% of her body weight by next year.  Planning on working on this by decreasing her calorie intake, increasing her water intake and exercising during the week.  Discussed options for home work outs such as using the [employer] and YouTube with free home work outs.    Plan: 1. Work to decrease calorie intake and increase water and exercise. 2. RTC in 3-6 months for weight check and CPE

## 2020-06-25 NOTE — Progress Notes (Signed)
Subjective:    Patient ID: Terri Gamble, female    DOB: [birth year], 59 y.o.   MRN: [id number]  Terri Gamble is a 59 y.o. female presenting on 06/25/2020 for weight management (appeals for biometric screening )   HPI  Terri Gamble presents to clinic for weight management and biometric screening appeal for her employer.  Reports she did not fall within the biometric screening requirements but is interested in working towards losing 10% of her body weight by next year.  Reports is due for her influenza vaccine and is getting at work this Friday. Has appointment with her OB/GYN for January and will have her well woman screening at that time.  Denies any other acute concerns today.  Depression screen [facility] 2/9 10/18/2019 01/05/2019 06/23/2018  Decreased Interest 0 0 0  Down, Depressed, Hopeless 1 0 0  PHQ - 2 Score 1 0 0  Altered sleeping 3 - -  Tired, decreased energy 3 - -  Change in appetite 1 - -  Feeling bad or failure about yourself  1 - -  Trouble concentrating 1 - -  Moving slowly or fidgety/restless 0 - -  Suicidal thoughts 0 - -  PHQ-9 Score 10 - -  Difficult doing work/chores Not difficult at all - -    Social History   Tobacco Use  . Smoking status: Former Smoker    Packs/day: 0.50    Years: 15.00    Pack years: 7.50    Types: Cigarettes    Quit date: 09/08/2004    Years since quitting: 15.8  . Smokeless tobacco: Never Used  Vaping Use  . Vaping Use: Never used  Substance Use Topics  . Alcohol use: Yes    Alcohol/week: 0.0 standard drinks    Comment: occasionally   . Drug use: No    Review of Systems  Constitutional: Negative.   HENT: Negative.   Eyes: Negative.   Respiratory: Negative.   Cardiovascular: Negative.   Gastrointestinal: Negative.   Endocrine: Negative.   Genitourinary: Negative.   Musculoskeletal: Negative.   Skin: Negative.   Allergic/Immunologic: Negative.   Neurological: Negative.   Hematological: Negative.     Psychiatric/Behavioral: Negative.    Per HPI unless specifically indicated above     Objective:    BP 129/85 (BP Location: Left Arm, Patient Position: Sitting, Cuff Size: Large)   Pulse (!) 102   Temp 98.7 F (37.1 C) (Oral)   Resp 18   Ht 5' 0.5" (1.537 m)   Wt 240 lb 9.6 oz (109.1 kg)   SpO2 98%   BMI 46.22 kg/m   Wt Readings from Last 3 Encounters:  06/25/20 240 lb 9.6 oz (109.1 kg)  10/18/18 212 lb 6.4 oz (96.3 kg)  06/23/18 216 lb 12.8 oz (98.3 kg)    Physical Exam Vitals and nursing note reviewed.  Constitutional:      General: She is not in acute distress.    Appearance: Normal appearance. She is well-developed and well-groomed. She is morbidly obese. She is not ill-appearing or toxic-appearing.  HENT:     Head: Normocephalic and atraumatic.     Nose:     Comments: [name] is in place, covering mouth and nose. Eyes:     General: Lids are normal. Vision grossly intact.        Right eye: No discharge.        Left eye: No discharge.     Extraocular Movements: Extraocular movements intact.     Conjunctiva/sclera:  Conjunctivae normal.     Pupils: Pupils are equal, round, and reactive to light.  Cardiovascular:     Rate and Rhythm: Normal rate and regular rhythm.     Pulses: Normal pulses.     Heart sounds: Normal heart sounds. No murmur heard.  No friction rub. No gallop.   Pulmonary:     Effort: Pulmonary effort is normal. No respiratory distress.     Breath sounds: Normal breath sounds.  Musculoskeletal:     Right lower leg: No edema.     Left lower leg: No edema.  Skin:    General: Skin is warm and dry.     Capillary Refill: Capillary refill takes less than 2 seconds.  Neurological:     General: No focal deficit present.     Mental Status: She is alert and oriented to person, place, and time.  Psychiatric:        Attention and Perception: Attention and perception normal.        Mood and Affect: Mood and affect normal.        Speech: Speech normal.         Behavior: Behavior normal. Behavior is cooperative.        Thought Content: Thought content normal.        Cognition and Memory: Cognition and memory normal.        Judgment: Judgment normal.    Results for orders placed or performed in visit on 05/30/20  Novel Coronavirus, NAA ([facility])   Specimen: Nasopharyngeal(NP) swabs in vial transport medium   Nasopharynge  Screenin  Result Value Ref Range   SARS-CoV-2, NAA Not Detected Not Detected  SARS-COV-2, NAA 2 DAY TAT   Nasopharynge  Screenin  Result Value Ref Range   SARS-CoV-2, NAA 2 DAY TAT Performed       Assessment & Plan:   Problem List Items Addressed This Visit      Cardiovascular and Mediastinum   Essential hypertension - Primary    Controlled hypertension.  Pt is not working on lifestyle modifications.  Taking medications tolerating well without side effects.  Complications:  Morbid obesity  Plan: 1. Continue taking lisinopril $RemoveBeforeDEI'10mg'[id number]$  daily 2. Obtain labs in the next few days  3. Encouraged heart healthy diet and increasing exercise to 30 minutes most days of the week, going no more than 2 days in a row without exercise. 4. Check BP 1-2 x per week at home, keep log, and bring to clinic at next appointment. 5. Follow up 3 months.       Relevant Orders   CBC with Differential   CMP14+EGFR     Other   Encounter for weight management    Not meeting biometric screening for employer due to BMI > 30.  Requesting appeal for biometric screening.  Interested in losing 10% of her body weight by next year.  Planning on working on this by decreasing her calorie intake, increasing her water intake and exercising during the week.  Discussed options for home work outs such as using the [employer] and YouTube with free home work outs.    Plan: 1. Work to decrease calorie intake and increase water and exercise. 2. RTC in 3-6 months for weight check and CPE       Other Visit Diagnoses    Long-term use of high-risk medication        Relevant Orders   Lipid Profile   Weight gain       Relevant Orders   TSH +  free T4      No orders of the defined types were placed in this encounter.   Follow up plan: Return in about 6 months (around 12/24/2020) for CPE.   Harlin Rain, [place] [facility] Nurse Practitioner [facility] 06/25/2020, 11:33 AM

## 2020-06-25 NOTE — Patient Instructions (Signed)
Have your labs drawn and we will contact you with the results.  Send over your biometric screening form and I will complete and send back to you  We will plan to see you back in 3-6 months for your physical  You will receive a survey after today's visit either digitally by e-mail or paper by [place] mail. Your experiences and feedback matter to [place].  Please respond so we know how we are doing as we provide care for you.  Call us with any questions/concerns/needs.  It is my goal to be available to you for your health concerns.  Thanks for choosing me to be a partner in your healthcare needs!  Harlin Rain, FNP-C Family Nurse Practitioner [facility] Phone: [phone]

## 2020-06-25 NOTE — Assessment & Plan Note (Signed)
Controlled hypertension.  Pt is not working on lifestyle modifications.  Taking medications tolerating well without side effects.  Complications:  Morbid obesity  Plan: 1. Continue taking lisinopril 10mg  daily 2. Obtain labs in the next few days  3. Encouraged heart healthy diet and increasing exercise to 30 minutes most days of the week, going no more than 2 days in a row without exercise. 4. Check BP 1-2 x per week at home, keep log, and bring to clinic at next appointment. 5. Follow up 3 months.

## 2020-06-25 NOTE — Telephone Encounter (Signed)
Patient is calling because she emailed some forms to rachel and she would like to be sure that she received them. Please advise CB- [phone]

## 2020-08-13 ENCOUNTER — Other Ambulatory Visit: Payer: Self-pay | Admitting: Nurse Practitioner

## 2020-08-13 DIAGNOSIS — I10 Essential (primary) hypertension: Secondary | ICD-10-CM

## 2020-08-19 NOTE — Telephone Encounter (Signed)
Requested Prescriptions  Pending Prescriptions Disp Refills  . lisinopril (ZESTRIL) 10 MG tablet [Pharmacy Med Name: LISINOPRIL 10MG  TABLETS] 90 tablet 1    Sig: TAKE 1 TABLET BY MOUTH EVERY DAY     Cardiovascular:  ACE Inhibitors Failed - 08/13/2020  5:46 PM      Failed - Cr in normal range and within 180 days    Creatinine  Date Value Ref Range Status  08/03/2019 0.9 0.5 - 1.1 Final  12/11/2011 0.81 0.60 - 1.30 mg/dL Final   Creatinine, Ser  Date Value Ref Range Status  05/19/2017 0.83 0.40 - 1.20 mg/dL Final         Failed - K in normal range and within 180 days    Potassium  Date Value Ref Range Status  08/03/2019 4.6 3.4 - 5.3 Final  12/11/2011 4.1 3.5 - 5.1 mmol/L Final         Passed - Patient is not pregnant      Passed - Last BP in normal range    BP Readings from Last 1 Encounters:  06/25/20 129/85         Passed - Valid encounter within last 6 months    Recent Outpatient Visits          1 month ago Essential hypertension   [place], Lupita Raider, FNP   3 months ago Acute non-recurrent sinusitis, unspecified location   [place], FNP   10 months ago [place], DO   11 months ago Acute non-recurrent frontal sinusitis   [facility] [place], [place]   11 months ago Cough   [facility] [place], Fabio Bering [place], [place]

## 2020-09-18 ENCOUNTER — Other Ambulatory Visit: Payer: Self-pay

## 2020-09-18 ENCOUNTER — Telehealth (INDEPENDENT_AMBULATORY_CARE_PROVIDER_SITE_OTHER): Payer: Managed Care, Other (non HMO) | Admitting: Family Medicine

## 2020-09-18 ENCOUNTER — Encounter: Payer: Self-pay | Admitting: Family Medicine

## 2020-09-18 DIAGNOSIS — B349 Viral infection, unspecified: Secondary | ICD-10-CM | POA: Insufficient documentation

## 2020-09-18 MED ORDER — PROMETHAZINE-DM 6.25-15 MG/5ML PO SYRP: 5.0000 mL | ORAL_SOLUTION | Freq: Four times a day (QID) | ORAL | 0 refills | Status: DC | PRN

## 2020-09-18 MED ORDER — BENZONATATE 100 MG PO CAPS: 100.0000 mg | ORAL_CAPSULE | Freq: Two times a day (BID) | ORAL | 0 refills | Status: DC | PRN

## 2020-09-18 NOTE — Progress Notes (Signed)
Virtual Visit via Telephone  The purpose of this virtual visit is to provide medical care while limiting exposure to the novel coronavirus (COVID19) for both patient and office staff.  Consent was obtained for phone visit:  Yes.   Answered questions that patient had about telehealth interaction:  Yes.   I discussed the limitations, risks, security and privacy concerns of performing an evaluation and management service by telephone. I also discussed with the patient that there may be a patient responsible charge related to this service. The patient expressed understanding and agreed to proceed.  Patient is at home and is accessed via telephone Services are provided by Harlin Rain, FNP-C from [facility])  ---------------------------------------------------------------------- Chief Complaint  Patient presents with  . Cough    Productive cough, chest soreness from coughing, fever, scratchy throat, sinus pressure, ear pressure x 3 days     S: Reviewed CMA documentation. I have called patient and gathered additional HPI as follows:  Patient presents for virtual telemedicine visit via telephone for concerns of productive cough, chest soreness from cough, fever up to 100.6 yesterday, with sore/scratchy throat with sinus and ear pressure x 3 days.  Has had body aches that have improved from yesterday.  Has had some nausea and change in taste.  Denies chills/shakes, SOB, DOE, CP, abdominal pain, vomiting or diarrhea.  Unknown sick contact exposure.  Has not had COVID testing.  Patient is currently home Denies any high risk travel to areas of current concern for COVID19. Denies any known or suspected exposure to person with or possibly with COVID19.  Past Medical History:  Diagnosis Date  . Hypertension   . Seasonal allergies    Social History   Tobacco Use  . Smoking status: Former Smoker    Packs/day: 0.50    Years: 15.00    Pack years: 7.50    Types:  Cigarettes    Quit date: 09/08/2004    Years since quitting: 16.0  . Smokeless tobacco: Never Used  Vaping Use  . Vaping Use: Never used  Substance Use Topics  . Alcohol use: Yes    Alcohol/week: 0.0 standard drinks    Comment: occasionally   . Drug use: No    Current Outpatient Medications:  .  benzonatate (TESSALON) 100 MG capsule, Take 1 capsule (100 mg total) by mouth 2 (two) times daily as needed for cough., Disp: 20 capsule, Rfl: 0 .  Biotin 3 MG TABS, Take by mouth., Disp: , Rfl:  .  cholecalciferol (VITAMIN D3) 25 MCG (1000 UNIT) tablet, Take 1,000 Units by mouth daily., Disp: , Rfl:  .  ibuprofen (ADVIL,MOTRIN) 200 MG tablet, Take 400 mg by mouth 2 (two) times daily. , Disp: , Rfl:  .  lisinopril (ZESTRIL) 10 MG tablet, TAKE 1 TABLET BY MOUTH EVERY DAY, Disp: 90 tablet, Rfl: 1 .  promethazine-dextromethorphan (PROMETHAZINE-DM) 6.25-15 MG/5ML syrup, Take 5 mLs by mouth 4 (four) times daily as needed for cough., Disp: 118 mL, Rfl: 0 .  valACYclovir (VALTREX) 500 MG tablet, Take 500 mg by mouth 2 (two) times daily as needed (for fever blisters). (Patient not taking: Reported on 09/18/2020), Disp: , Rfl:   Depression screen [facility] 2/9 10/18/2019 01/05/2019 06/23/2018  Decreased Interest 0 0 0  Down, Depressed, Hopeless 1 0 0  PHQ - 2 Score 1 0 0  Altered sleeping 3 - -  Tired, decreased energy 3 - -  Change in appetite 1 - -  Feeling bad or failure about  yourself  1 - -  Trouble concentrating 1 - -  Moving slowly or fidgety/restless 0 - -  Suicidal thoughts 0 - -  PHQ-9 Score 10 - -  Difficult doing work/chores Not difficult at all - -    GAD 7 : Generalized Anxiety Score 10/18/2019 06/09/2017  Nervous, Anxious, on Edge 3 2  Control/stop worrying 3 0  Worry too much - different things 3 0  Trouble relaxing 2 2  Restless 1 1  Easily annoyed or irritable 1 2  Afraid - awful might happen 1 0  Total GAD 7 Score 14 7  Anxiety Difficulty Not difficult at all Somewhat difficult     -------------------------------------------------------------------------- O: No physical exam performed due to remote telephone encounter.  Physical Exam: Patient remotely monitored without video.  Verbal communication appropriate.  Cognition normal.  No results found for this or any previous visit (from the past 2160 hour(s)).  -------------------------------------------------------------------------- A&P:  Problem List Items Addressed This Visit      Other   Viral infection - Primary    Likely viral infection based on symptoms.  Encouraged COVID testing this evening at [facility] testing site and to make appt online.  Will treat with Promethazine DM at night, according to directions for cough and tessalon perles during the day.  Can take OTC ibuprofen, according to packaging directions and OTC allergy medicine such as claritin, zyrtec or allegra.  Strict ER precautions.  RTC if symptoms worsen or fail to improve.      Relevant Medications   benzonatate (TESSALON) 100 MG capsule   promethazine-dextromethorphan (PROMETHAZINE-DM) 6.25-15 MG/5ML syrup      Meds ordered this encounter  Medications  . benzonatate (TESSALON) 100 MG capsule    Sig: Take 1 capsule (100 mg total) by mouth 2 (two) times daily as needed for cough.    Dispense:  20 capsule    Refill:  0  . promethazine-dextromethorphan (PROMETHAZINE-DM) 6.25-15 MG/5ML syrup    Sig: Take 5 mLs by mouth 4 (four) times daily as needed for cough.    Dispense:  118 mL    Refill:  0    Follow-up: - Return if symptoms worsen or fail to improve  Patient verbalizes understanding with the above medical recommendations including the limitation of remote medical advice.  Specific follow-up and call-back criteria were given for patient to follow-up or seek medical care more urgently if needed.  - Time spent in direct consultation with patient on phone: 8 minutes  Harlin Rain, [facility] 09/18/2020, 3:19 PM

## 2020-09-18 NOTE — Assessment & Plan Note (Signed)
Likely viral infection based on symptoms.  Encouraged COVID testing this evening at [facility] testing site and to make appt online.  Will treat with Promethazine DM at night, according to directions for cough and tessalon perles during the day.  Can take OTC ibuprofen, according to packaging directions and OTC allergy medicine such as claritin, zyrtec or allegra.  Strict ER precautions.  RTC if symptoms worsen or fail to improve.

## 2020-09-20 ENCOUNTER — Other Ambulatory Visit: Payer: Managed Care, Other (non HMO)

## 2020-09-20 ENCOUNTER — Telehealth: Payer: Self-pay

## 2020-09-20 ENCOUNTER — Other Ambulatory Visit: Payer: Self-pay | Admitting: Family Medicine

## 2020-09-20 DIAGNOSIS — R110 Nausea: Secondary | ICD-10-CM

## 2020-09-20 DIAGNOSIS — Z20822 Contact with and (suspected) exposure to covid-19: Secondary | ICD-10-CM

## 2020-09-20 MED ORDER — ONDANSETRON 4 MG PO TBDP: 4.0000 mg | ORAL_TABLET | Freq: Three times a day (TID) | ORAL | 0 refills | Status: DC | PRN

## 2020-09-20 NOTE — Telephone Encounter (Signed)
I contacted the patient to verify if she was currently taking the Promethazine-DM cough syrup. She informed me that she have not been taking any cough medication in the last 2 days because her cough have improved. She complains of nausea x 1 day. She would like something called into the pharmacy to help with that symptom.

## 2020-09-20 NOTE — Telephone Encounter (Signed)
Copied from [place] [id number]. Topic: General - Other >> Sep 20, 2020  1:48 PM Yvette Rack wrote: Reason for CRM: Pt requests a prescription for a medication for nausea. Pt requests that the Rx be sent to [facility], [address]  Phone: [phone]   Fax: [phone]

## 2020-09-20 NOTE — Telephone Encounter (Signed)
The pt was notified. No questions or concerns. 

## 2020-09-20 NOTE — Telephone Encounter (Signed)
Sent rx for ondansetron ODT to her pharmacy

## 2020-09-22 LAB — SARS-COV-2, NAA 2 DAY TAT

## 2020-09-22 LAB — NOVEL CORONAVIRUS, NAA: SARS-CoV-2, NAA: DETECTED — AB

## 2021-02-26 ENCOUNTER — Other Ambulatory Visit: Payer: Self-pay | Admitting: Family Medicine

## 2021-02-26 DIAGNOSIS — I10 Essential (primary) hypertension: Secondary | ICD-10-CM

## 2021-02-26 NOTE — Telephone Encounter (Signed)
Copied from [place] [id number]. Topic: Quick Communication - Rx Refill/Question >> Feb 26, 2021  9:15 AM Tessa Lerner A wrote: Medication: lisinopril (ZESTRIL) 10 MG tablet   Has the patient contacted their pharmacy? Yes.   (Agent: If no, request that the patient contact the pharmacy for the refill.) (Agent: If yes, when and what did the pharmacy advise?)  Preferred Pharmacy (with phone number or street name): [facility] [place], [place] - [address] AT [address]  Phone:  [phone] Fax:  [phone]  Agent: Please be advised that RX refills may take up to 3 business days. We ask that you follow-up with your pharmacy.

## 2021-02-26 NOTE — Telephone Encounter (Signed)
  Notes to clinic: Patient last seen 06/25/2020 for HTN Patient has not seen new provider  Due for follow up 12/24/2020  Requested Prescriptions  Pending Prescriptions Disp Refills   lisinopril (ZESTRIL) 10 MG tablet 90 tablet 1    Sig: Take 1 tablet (10 mg total) by mouth daily.      Cardiovascular:  ACE Inhibitors Failed - 02/26/2021  9:19 AM      Failed - Cr in normal range and within 180 days    Creatinine  Date Value Ref Range Status  08/03/2019 0.9 0.5 - 1.1 Final  12/11/2011 0.81 0.60 - 1.30 mg/dL Final   Creatinine, Ser  Date Value Ref Range Status  05/19/2017 0.83 0.40 - 1.20 mg/dL Final          Failed - K in normal range and within 180 days    Potassium  Date Value Ref Range Status  08/03/2019 4.6 3.4 - 5.3 Final  12/11/2011 4.1 3.5 - 5.1 mmol/L Final          Passed - Patient is not pregnant      Passed - Last BP in normal range    BP Readings from Last 1 Encounters:  06/25/20 129/85          Passed - Valid encounter within last 6 months    Recent Outpatient Visits           5 months ago Viral infection   [facility], Lupita Raider, FNP   8 months ago Essential hypertension   [facility], Lupita Raider, FNP   9 months ago Acute non-recurrent sinusitis, unspecified location   [place], FNP   1 year ago [place], DO   1 year ago Acute non-recurrent frontal sinusitis   [facility] [place], [place], [place]

## 2021-02-27 ENCOUNTER — Other Ambulatory Visit: Payer: Self-pay

## 2021-02-27 DIAGNOSIS — I10 Essential (primary) hypertension: Secondary | ICD-10-CM

## 2021-02-27 MED ORDER — LISINOPRIL 10 MG PO TABS: 1.0000 | ORAL_TABLET | Freq: Every day | ORAL | 0 refills | Status: DC

## 2021-03-28 ENCOUNTER — Ambulatory Visit: Payer: Self-pay | Admitting: *Deleted

## 2021-03-28 NOTE — Telephone Encounter (Signed)
Pt called stating that the pt is experiencing lower stomach pain, frequency urinating, and bloating x 5 days. Pt is requesting to have a call back to discuss symptoms. Please advise Called patient to review sx. C/o frequent urination and now pressure in lower abd. Since yesterday. Denies burning during urination, bright yellow urine, no odor. Denies fever. Urinates "a lot" and when finished feels like she has to go again. C/o abdominal and bloating x 5 days. When eating spicy foods , onions , left side of abdomen painful and bloating noted. Nausea on Saturday. Pain comes and goes but becoming more constant. Denies diarrhea but noted with softer BM's. 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 No available appt until Monday and scheduled for 04/01/21. Last provider seen [place]. Care advise given. Patient verbalized understanding of care advise and to call back or go to [facility] or ED if symptoms worsen.   Reason for Disposition  [1] MODERATE pain (e.g., interferes with normal activities) AND [2] pain comes and goes (cramps) AND [3] present > 24 hours  (Exception: pain with Vomiting or Diarrhea - see that Guideline)  Answer Assessment - Initial Assessment Questions 1. LOCATION: "Where does it hurt?"      Low abdomen 2. RADIATION: "Does the pain shoot anywhere else?" (e.g., chest, back)     Back  3. ONSET: "When did the pain begin?" (e.g., minutes, hours or days ago)      Yesterday  4. SUDDEN: "Gradual or sudden onset?"     Abdominal pain and bloating x 5 days  5. PATTERN "Does the pain come and go, or is it constant?"    - If constant: "Is it getting better, staying the same, or worsening?"      (Note: Constant means the pain never goes away completely; most serious pain is constant and it progresses)     - If intermittent: "How long does it last?" "Do you have pain now?"     (Note: Intermittent means the pain goes away completely between bouts)     Comes and goes becoming  more constant 6. SEVERITY: "How bad is the pain?"  (e.g., Scale 1-10; mild, moderate, or severe)   - MILD (1-3): doesn't interfere with normal activities, abdomen soft and not tender to touch    - MODERATE (4-7): interferes with normal activities or awakens from sleep, abdomen tender to touch    - SEVERE (8-10): excruciating pain, doubled over, unable to do any normal activities      Able to perform daily activities  7. RECURRENT SYMPTOM: "Have you ever had this type of stomach pain before?" If Yes, ask: "When was the last time?" and "What happened that time?"      Na  8. CAUSE: "What do you think is causing the stomach pain?"     Not sure  9. RELIEVING/AGGRAVATING FACTORS: "What makes it better or worse?" (e.g., movement, antacids, bowel movement)     Na  10. OTHER SYMPTOMS: "Do you have any other symptoms?" (e.g., back pain, diarrhea, fever, urination pain, vomiting)       Back pain, loose bm's , frequent urination, nausea Saturday. 11. PREGNANCY: "Is there any chance you are pregnant?" "When was your last menstrual period?"       na  Protocols used: Abdominal Pain - [facility]

## 2021-04-01 ENCOUNTER — Ambulatory Visit: Payer: Self-pay | Admitting: Internal Medicine

## 2021-04-01 NOTE — Progress Notes (Deleted)
HPI  Pt presents to the clinic today with c/o urinary symptoms.   Review of Systems  Past Medical History:  Diagnosis Date   Hypertension    Seasonal allergies     Family History  Problem Relation Age of Onset   Heart disease Mother    Kidney disease Mother    Hypertension Mother    Diabetes Mother    Diabetes Maternal Grandmother    Hypertension Maternal Grandmother    Heart disease Maternal Grandmother    Stroke Maternal Grandmother    Stomach cancer Paternal Uncle        x 2   Lung cancer Paternal Uncle    Lung cancer Maternal Uncle    Heart disease Maternal Grandfather    Cardiomyopathy Maternal Grandfather    Heart disease Paternal Grandmother    Stroke Paternal Grandmother    Hypertension Paternal Grandmother    Alzheimer's disease Paternal Grandfather    Colon cancer Neg Hx    Pancreatic cancer Neg Hx    Esophageal cancer Neg Hx    Liver disease Neg Hx    Ovarian cancer Neg Hx    Breast cancer Neg Hx     Social History   Socioeconomic History   Marital status: Divorced    Spouse name: Not on file   Number of children: Not on file   Years of education: Not on file   Highest education level: Not on file  Occupational History   Not on file  Tobacco Use   Smoking status: Former    Packs/day: 0.50    Years: 15.00    Pack years: 7.50    Types: Cigarettes    Quit date: 09/08/2004    Years since quitting: 16.5   Smokeless tobacco: Never  Vaping Use   Vaping Use: Never used  Substance and Sexual Activity   Alcohol use: Yes    Alcohol/week: 0.0 standard drinks    Comment: occasionally    Drug use: No   Sexual activity: Yes    Birth control/protection: Post-menopausal  Other Topics Concern   Not on file  Social History Narrative   Not on file   Social Determinants of Health   Financial Resource Strain: Not on file  Food Insecurity: Not on file  Transportation Needs: Not on file  Physical Activity: Not on file  Stress: Not on file  Social  Connections: Not on file  Intimate Partner Violence: Not on file    Allergies  Allergen Reactions   Codeine Nausea Only   Morphine And Related Other (See Comments)    Headache   Latex Rash     Constitutional: Denies fever, malaise, fatigue, headache or abrupt weight changes.   GU: Pt reports urgency, frequency and pain with urination. Denies burning sensation, blood in urine, odor or discharge. Skin: Denies redness, rashes, lesions or ulcercations.   No other specific complaints in a complete review of systems (except as listed in HPI above).    Objective:   Physical Exam  There were no vitals taken for this visit. Wt Readings from Last 3 Encounters:  06/25/20 240 lb 9.6 oz (109.1 kg)  10/18/18 212 lb 6.4 oz (96.3 kg)  06/23/18 216 lb 12.8 oz (98.3 kg)    General: Appears her stated age, well developed, well nourished in NAD. Cardiovascular: Normal rate and rhythm. S1,S2 noted.   Pulmonary/Chest: Normal effort and positive vesicular breath sounds. No respiratory distress. No wheezes, rales or ronchi noted.  Abdomen: Soft. Normal bowel sounds. No  distention or masses noted.  Tender to palpation over the bladder area. No CVA tenderness.        Assessment & Plan:   Urgency, Frequency, Dysuria secondary to   Urinalysis: Will send urine culture eRx sent if for Macrobid 100 mg BID x 5 days OK to take AZO OTC Drink plenty of fluids  RTC as needed or if symptoms persist. Webb Silversmith, NP This visit occurred during the SARS-CoV-2 public health emergency.  Safety protocols were in place, including screening questions prior to the visit, additional usage of staff PPE, and extensive cleaning of exam room while observing appropriate contact time as indicated for disinfecting solutions.

## 2021-05-25 ENCOUNTER — Other Ambulatory Visit: Payer: Self-pay | Admitting: Internal Medicine

## 2021-05-25 DIAGNOSIS — I10 Essential (primary) hypertension: Secondary | ICD-10-CM

## 2021-05-25 NOTE — Telephone Encounter (Signed)
Requested medication (s) are due for refill today: yes  Requested medication (s) are on the active medication list: yes  Last refill:  02/27/21 #90  Future visit scheduled: no  Notes to clinic:  Called pt and LM on VM that she needs to an appt with her new provider   Requested Prescriptions  Pending Prescriptions Disp Refills   lisinopril (ZESTRIL) 10 MG tablet [Pharmacy Med Name: LISINOPRIL '10MG'$  TABLETS] 90 tablet 0    Sig: TAKE 1 TABLET(10 MG) BY MOUTH DAILY     Cardiovascular:  ACE Inhibitors Failed - 05/25/2021  3:17 AM      Failed - Cr in normal range and within 180 days    Creatinine  Date Value Ref Range Status  08/03/2019 0.9 0.5 - 1.1 Final  12/11/2011 0.81 0.60 - 1.30 mg/dL Final   Creatinine, Ser  Date Value Ref Range Status  05/19/2017 0.83 0.40 - 1.20 mg/dL Final          Failed - K in normal range and within 180 days    Potassium  Date Value Ref Range Status  08/03/2019 4.6 3.4 - 5.3 Final  12/11/2011 4.1 3.5 - 5.1 mmol/L Final          Failed - Valid encounter within last 6 months    Recent Outpatient Visits           8 months ago Viral infection   [facility], [place]ita Raider, FNP   11 months ago Essential hypertension   [place], FNP   1 year ago Acute non-recurrent sinusitis, unspecified location   [place], FNP   1 year ago [place], DO   1 year ago Acute non-recurrent frontal sinusitis   [facility] [place], [place], [place]              Passed - Patient is not pregnant      Passed - Last BP in normal range    BP Readings from Last 1 Encounters:  06/25/20 129/85

## 2021-05-29 ENCOUNTER — Telehealth: Payer: Managed Care, Other (non HMO) | Admitting: Nurse Practitioner

## 2021-05-29 DIAGNOSIS — J00 Acute nasopharyngitis [common cold]: Secondary | ICD-10-CM | POA: Diagnosis not present

## 2021-05-29 MED ORDER — FLUTICASONE PROPIONATE 50 MCG/ACT NA SUSP: 2.0000 | Freq: Every day | NASAL | 6 refills | Status: DC

## 2021-05-29 MED ORDER — BENZONATATE 100 MG PO CAPS: 100.0000 mg | ORAL_CAPSULE | Freq: Three times a day (TID) | ORAL | 0 refills | Status: DC | PRN

## 2021-05-29 NOTE — Progress Notes (Signed)
E-Visit for Upper Respiratory Infection   We are sorry you are not feeling well.  Here is how we plan to help!  Based on what you have shared with me, it looks like you may have a viral upper respiratory infection.  Upper respiratory infections are caused by a large number of viruses; however, rhinovirus is the most common cause.   Symptoms vary from person to person, with common symptoms including sore throat, cough, fatigue or lack of energy and feeling of general discomfort.  A low-grade fever of up to 100.4 may present, but is often uncommon.  Symptoms vary however, and are closely related to a person's age or underlying illnesses.  The most common symptoms associated with an upper respiratory infection are nasal discharge or congestion, cough, sneezing, headache and pressure in the ears and face.  These symptoms usually persist for about 3 to 10 days, but can last up to 2 weeks.  It is important to know that upper respiratory infections do not cause serious illness or complications in most cases.    Upper respiratory infections can be transmitted from person to person, with the most common method of transmission being a person's hands.  The virus is able to live on the skin and can infect other persons for up to 2 hours after direct contact.  Also, these can be transmitted when someone coughs or sneezes; thus, it is important to cover the mouth to reduce this risk.  To keep the spread of the illness at [place], good hand hygiene is very important.  I do think that you should have a covid test just to make sure you do not have it. All of your symptoms go along with covid as well.  This is an infection that is most likely caused by a virus. There are no specific treatments other than to help you with the symptoms until the infection runs its course.  We are sorry you are not feeling well.  Here is how we plan to help!   For nasal congestion, you may use an oral decongestants such as Mucinex D or if you  have glaucoma or high blood pressure use plain Mucinex.  Saline nasal spray or nasal drops can help and can safely be used as often as needed for congestion.  For your congestion, I have prescribed Fluticasone nasal spray one spray in each nostril twice a day  If you do not have a history of heart disease, hypertension, diabetes or thyroid disease, prostate/bladder issues or glaucoma, you may also use Sudafed to treat nasal congestion.  It is highly recommended that you consult with a pharmacist or your primary care physician to ensure this medication is safe for you to take.     If you have a cough, you may use cough suppressants such as Delsym and Robitussin.  If you have glaucoma or high blood pressure, you can also use Coricidin HBP.   For cough I have prescribed for you A prescription cough medication called Tessalon Perles 100 mg. You may take 1-2 capsules every 8 hours as needed for cough  If you have a sore or scratchy throat, use a saltwater gargle-  to  teaspoon of salt dissolved in a 4-ounce to 8-ounce glass of warm water.  Gargle the solution for approximately 15-30 seconds and then spit.  It is important not to swallow the solution.  You can also use throat lozenges/cough drops and Chloraseptic spray to help with throat pain or discomfort.  Warm or  cold liquids can also be helpful in relieving throat pain.  For headache, pain or general discomfort, you can use Ibuprofen or Tylenol as directed.   Some authorities believe that zinc sprays or the use of Echinacea may shorten the course of your symptoms.   HOME CARE Only take medications as instructed by your medical team. Be sure to drink plenty of fluids. Water is fine as well as fruit juices, sodas and electrolyte beverages. You may want to stay away from caffeine or alcohol. If you are nauseated, try taking small sips of liquids. How do you know if you are getting enough fluid? Your urine should be a pale yellow or almost  colorless. Get rest. Taking a steamy shower or using a humidifier may help nasal congestion and ease sore throat pain. You can place a towel over your head and breathe in the steam from hot water coming from a faucet. Using a saline nasal spray works much the same way. Cough drops, hard candies and sore throat lozenges may ease your cough. Avoid close contacts especially the very young and the elderly Cover your mouth if you cough or sneeze Always remember to wash your hands.   GET HELP RIGHT AWAY IF: You develop worsening fever. If your symptoms do not improve within 10 days You develop yellow or green discharge from your nose over 3 days. You have coughing fits You develop a severe head ache or visual changes. You develop shortness of breath, difficulty breathing or start having chest pain Your symptoms persist after you have completed your treatment plan  MAKE SURE YOU  Understand these instructions. Will watch your condition. Will get help right away if you are not doing well or get worse.  Thank you for choosing an e-visit.  Your e-visit answers were reviewed by a board certified advanced clinical practitioner to complete your personal care plan. Depending upon the condition, your plan could have included both over the counter or prescription medications.  Please review your pharmacy choice. Make sure the pharmacy is open so you can pick up prescription now. If there is a problem, you may contact your provider through [employer] and have the prescription routed to another pharmacy.  Your safety is important to [place]. If you have drug allergies check your prescription carefully.   For the next 24 hours you can use MyChart to ask questions about today's visit, request a non-urgent call back, or ask for a work or school excuse. You will get an email in the next two days asking about your experience. I hope that your e-visit has been valuable and will speed your recovery.  5-10  minutes spent reviewing and documenting in chart.

## 2021-05-30 ENCOUNTER — Ambulatory Visit: Payer: Self-pay | Admitting: *Deleted

## 2021-05-30 NOTE — Telephone Encounter (Signed)
Patient is calling to report she has persistent cough- causing chest pain and tightness, congestion. Patient did e-visit but was not treated with anything to help with cough. Patient is using OTC medications- Mucinex, cough medications- she is requesting prednisone/antibiotic. Advised UC - no open appointment in office.

## 2021-05-30 NOTE — Telephone Encounter (Signed)
Reason for Disposition  Chest pain or pressure  Answer Assessment - Initial Assessment Questions 1. COVID-19 DIAGNOSIS: "Who made your COVID-19 diagnosis?" "Was it confirmed by a positive lab test or self-test?" If not diagnosed by a doctor (or NP/PA), ask "Are there lots of cases (community spread) where you live?" Note: See public health department website, if unsure.     Suspect symptoms 2. COVID-19 EXPOSURE: "Was there any known exposure to COVID before the symptoms began?" CDC Definition of close contact: within 6 feet (2 meters) for a total of 15 minutes or more over a 24-hour period.      unknown 3. ONSET: "When did the COVID-19 symptoms start?"      Sunday- aches 4. WORST SYMPTOM: "What is your worst symptom?" (e.g., cough, fever, shortness of breath, muscle aches)     Chest tightness/congestion 5. COUGH: "Do you have a cough?" If Yes, ask: "How bad is the cough?"       Yes- productive sometimes- yellowish/green 6. FEVER: "Do you have a fever?" If Yes, ask: "What is your temperature, how was it measured, and when did it start?"     no 7. RESPIRATORY STATUS: "Describe your breathing?" (e.g., shortness of breath, wheezing, unable to speak)      Chest tightness 8. BETTER-SAME-WORSE: "Are you getting better, staying the same or getting worse compared to yesterday?"  If getting worse, ask, "In what way?"     Better today- but cough is worse-tightness in chest 9. HIGH RISK DISEASE: "Do you have any chronic medical problems?" (e.g., asthma, heart or lung disease, weak immune system, obesity, etc.)     Hypertension,over weight  10. VACCINE: "Have you had the COVID-19 vaccine?" If Yes, ask: "Which one, how many shots, when did you get it?"       Pfizer  11. BOOSTER: "Have you received your COVID-19 booster?" If Yes, ask: "Which one and when did you get it?"       no 12. PREGNANCY: "Is there any chance you are pregnant?" "When was your last menstrual period?"       no 13. OTHER SYMPTOMS:  "Do you have any other symptoms?"  (e.g., chills, fatigue, headache, loss of smell or taste, muscle pain, sore throat)       *No Answer* 14. O2 SATURATION MONITOR:  "Do you use an oxygen saturation monitor (pulse oximeter) at home?" If Yes, ask "What is your reading (oxygen level) today?" "What is your usual oxygen saturation reading?" (e.g., 95%)       no  Protocols used: Coronavirus (COVID-19) Diagnosed or Suspected-A-AH

## 2021-08-24 ENCOUNTER — Other Ambulatory Visit: Payer: Self-pay | Admitting: Internal Medicine

## 2021-08-24 DIAGNOSIS — I10 Essential (primary) hypertension: Secondary | ICD-10-CM

## 2021-08-25 NOTE — Telephone Encounter (Signed)
Pt canceled appt in July  Last RF 05/27/21 #30 day courtesy RF.  No upcoming appts. Sent MyChart message to call office to make appt.   Requested Prescriptions  Pending Prescriptions Disp Refills   lisinopril (ZESTRIL) 10 MG tablet [Pharmacy Med Name: LISINOPRIL 10MG  TABLETS] 90 tablet     Sig: TAKE 1 TABLET(10 MG) BY MOUTH DAILY     Cardiovascular:  ACE Inhibitors Failed - 08/24/2021  3:19 AM      Failed - Cr in normal range and within 180 days    Creatinine  Date Value Ref Range Status  08/03/2019 0.9 0.5 - 1.1 Final  12/11/2011 0.81 0.60 - 1.30 mg/dL Final   Creatinine, Ser  Date Value Ref Range Status  05/19/2017 0.83 0.40 - 1.20 mg/dL Final          Failed - K in normal range and within 180 days    Potassium  Date Value Ref Range Status  08/03/2019 4.6 3.4 - 5.3 Final  12/11/2011 4.1 3.5 - 5.1 mmol/L Final          Failed - Valid encounter within last 6 months    Recent Outpatient Visits           11 months ago Viral infection   [facility], Lupita Raider, [place]   1 year ago Essential hypertension   [place], FNP   1 year ago Acute non-recurrent sinusitis, unspecified location   [place], FNP   1 year ago [place], DO   1 year ago Acute non-recurrent frontal sinusitis   [facility] [place], [place], [place]              Passed - Patient is not pregnant      Passed - Last BP in normal range    BP Readings from Last 1 Encounters:  06/25/20 129/85

## 2021-08-27 ENCOUNTER — Ambulatory Visit (INDEPENDENT_AMBULATORY_CARE_PROVIDER_SITE_OTHER): Payer: Managed Care, Other (non HMO) | Admitting: Internal Medicine

## 2021-08-27 ENCOUNTER — Encounter: Payer: Self-pay | Admitting: Internal Medicine

## 2021-08-27 DIAGNOSIS — I10 Essential (primary) hypertension: Secondary | ICD-10-CM | POA: Diagnosis not present

## 2021-08-27 DIAGNOSIS — J069 Acute upper respiratory infection, unspecified: Secondary | ICD-10-CM | POA: Diagnosis not present

## 2021-08-27 MED ORDER — LISINOPRIL 10 MG PO TABS: ORAL_TABLET | ORAL | 0 refills | Status: DC

## 2021-08-27 NOTE — Patient Instructions (Signed)

## 2021-08-27 NOTE — Progress Notes (Signed)
Virtual Visit via Video Note  I connected with Terri Gamble on 08/27/21 at  9:00 AM EST by a video enabled telemedicine application and verified that I am speaking with the correct person using two identifiers.  Location: Patient: Home Provider: Office  Persons participating in this video call: [place] and Webb Silversmith, NP   I discussed the limitations of evaluation and management by telemedicine and the availability of in person appointments. The patient expressed understanding and agreed to proceed.  History of Present Illness:  Patient reports fever runny nose, sore throat and cough.  This started this morning.  She is blowing clear mucus out of her nose.  She denies difficulty swallowing.  The cough is mostly nonproductive.  She denies headache, nasal congestion, ear pain, shortness of breath, chest pain, nausea, vomiting or diarrhea.  She is taking Coricidin HBP OTC with some relief of symptoms.  She has not had sick contacts diagnosed with COVID or flu that she is aware of.  She would also like a refill of her Lisinopril.  She is currently on her last dose.   Past Medical History:  Diagnosis Date   Hypertension    Seasonal allergies     Current Outpatient Medications  Medication Sig Dispense Refill   benzonatate (TESSALON PERLES) 100 MG capsule Take 1 capsule (100 mg total) by mouth 3 (three) times daily as needed. 20 capsule 0   Biotin 3 MG TABS Take by mouth.     cholecalciferol (VITAMIN D3) 25 MCG (1000 UNIT) tablet Take 1,000 Units by mouth daily.     fluticasone (FLONASE) 50 MCG/ACT nasal spray Place 2 sprays into both nostrils daily. 16 g 6   ibuprofen (ADVIL,MOTRIN) 200 MG tablet Take 400 mg by mouth 2 (two) times daily.      lisinopril (ZESTRIL) 10 MG tablet TAKE 1 TABLET(10 MG) BY MOUTH DAILY 30 tablet 0   ondansetron (ZOFRAN-ODT) 4 MG disintegrating tablet Take 1 tablet (4 mg total) by mouth every 8 (eight) hours as needed for nausea or vomiting. 20  tablet 0   promethazine-dextromethorphan (PROMETHAZINE-DM) 6.25-15 MG/5ML syrup Take 5 mLs by mouth 4 (four) times daily as needed for cough. 118 mL 0   valACYclovir (VALTREX) 500 MG tablet Take 500 mg by mouth 2 (two) times daily as needed (for fever blisters). (Patient not taking: Reported on 09/18/2020)     No current facility-administered medications for this visit.    Allergies  Allergen Reactions   Codeine Nausea Only   Morphine And Related Other (See Comments)    Headache   Latex Rash    Family History  Problem Relation Age of Onset   Heart disease Mother    Kidney disease Mother    Hypertension Mother    Diabetes Mother    Diabetes Maternal Grandmother    Hypertension Maternal Grandmother    Heart disease Maternal Grandmother    Stroke Maternal Grandmother    Stomach cancer Paternal Uncle        x 2   Lung cancer Paternal Uncle    Lung cancer Maternal Uncle    Heart disease Maternal Grandfather    Cardiomyopathy Maternal Grandfather    Heart disease Paternal Grandmother    Stroke Paternal Grandmother    Hypertension Paternal Grandmother    Alzheimer's disease Paternal Grandfather    Colon cancer Neg Hx    Pancreatic cancer Neg Hx    Esophageal cancer Neg Hx    Liver disease Neg Hx  Ovarian cancer Neg Hx    Breast cancer Neg Hx     Social History   Socioeconomic History   Marital status: Divorced    Spouse name: Not on file   Number of children: Not on file   Years of education: Not on file   Highest education level: Not on file  Occupational History   Not on file  Tobacco Use   Smoking status: Former    Packs/day: 0.50    Years: 15.00    Pack years: 7.50    Types: Cigarettes    Quit date: 09/08/2004    Years since quitting: 16.9   Smokeless tobacco: Never  Vaping Use   Vaping Use: Never used  Substance and Sexual Activity   Alcohol use: Yes    Alcohol/week: 0.0 standard drinks    Comment: occasionally    Drug use: No   Sexual activity: Yes     Birth control/protection: Post-menopausal  Other Topics Concern   Not on file  Social History Narrative   Not on file   Social Determinants of Health   Financial Resource Strain: Not on file  Food Insecurity: Not on file  Transportation Needs: Not on file  Physical Activity: Not on file  Stress: Not on file  Social Connections: Not on file  Intimate Partner Violence: Not on file     Constitutional: Patient reports fever.  Denies malaise, fatigue, headache or abrupt weight changes.  HEENT: Patient reports sore throat.  Denies eye pain, eye redness, ear pain, ringing in the ears, wax buildup, runny nose, nasal congestion, bloody nose. Respiratory: Pt reports cough. Denies difficulty breathing, shortness of breath, or sputum production.   Cardiovascular: Denies chest pain, chest tightness, palpitations or swelling in the hands or feet.  Gastrointestinal: Denies abdominal pain, bloating, constipation, diarrhea or blood in the stool.  Neurological: Denies dizziness, difficulty with memory, difficulty with speech or problems with balance and coordination.   No other specific complaints in a complete review of systems (except as listed in HPI above).    Observations/Objective: Wt Readings from Last 3 Encounters:  06/25/20 240 lb 9.6 oz (109.1 kg)  10/18/18 212 lb 6.4 oz (96.3 kg)  06/23/18 216 lb 12.8 oz (98.3 kg)    General: In NAD. HEENT: Nose: Congestion noted; Throat/Mouth: No hoarseness noted Pulmonary/Chest: Normal effort. No respiratory distress.  Neurological: Alert and oriented.  BMET    Component Value Date/Time   NA 138 08/03/2019 0000   NA 142 12/11/2011 1941   K 4.6 08/03/2019 0000   K 4.1 12/11/2011 1941   CL 100 08/03/2019 0000   CL 104 12/11/2011 1941   CO2 23 (A) 08/03/2019 0000   CO2 27 12/11/2011 1941   GLUCOSE 89 05/19/2017 1621   GLUCOSE 83 12/11/2011 1941   BUN 19 08/03/2019 0000   BUN 20 (H) 12/11/2011 1941   CREATININE 0.9 08/03/2019 0000    CREATININE 0.83 05/19/2017 1621   CREATININE 0.81 12/11/2011 1941   CALCIUM 10.3 08/03/2019 0000   CALCIUM 9.1 12/11/2011 1941   GFRNONAA >60 02/18/2017 1109   GFRNONAA >60 12/11/2011 1941   GFRAA >60 02/18/2017 1109   GFRAA >60 12/11/2011 1941    Lipid Panel     Component Value Date/Time   CHOL 205 (A) 08/03/2019 0000   TRIG 91 08/03/2019 0000   HDL 66 08/03/2019 0000   LDLCALC 123 08/03/2019 0000    CBC    Component Value Date/Time   WBC 9.2 08/03/2019 0000  WBC 9.6 05/19/2017 1621   RBC 4.76 08/03/2019 0000   HGB 14.1 08/03/2019 0000   HGB 14.4 12/11/2011 1941   HCT 42 08/03/2019 0000   HCT 42.4 12/11/2011 1941   PLT 491 (A) 08/03/2019 0000   PLT 371 12/11/2011 1941   MCV 86.4 05/19/2017 1621   MCV 88 12/11/2011 1941   MCH 29.0 02/18/2017 1109   MCHC 33.8 05/19/2017 1621   RDW 13.7 05/19/2017 1621   RDW 13.3 12/11/2011 1941   LYMPHSABS 2.3 05/19/2017 1621   MONOABS 0.7 05/19/2017 1621   EOSABS 0.2 05/19/2017 1621   BASOSABS 0.1 05/19/2017 1621    Hgb A1C Lab Results  Component Value Date   HGBA1C 5.4 04/15/2018        Assessment and Plan:  Viral URI with Cough:  Encourage rest and fluids Advised her start Zyrtec and Flonase OTC Continue Coricidin HBP No indication for antibiotics or steroids at this time  Advised her to schedule an appointment for her annual exam  Follow Up Instructions:    I discussed the assessment and treatment plan with the patient. The patient was provided an opportunity to ask questions and all were answered. The patient agreed with the plan and demonstrated an understanding of the instructions.   The patient was advised to call back or seek an in-person evaluation if the symptoms worsen or if the condition fails to improve as anticipated.   Webb Silversmith, NP

## 2021-08-27 NOTE — Assessment & Plan Note (Signed)
Lisinopril refilled today Reinforced DASH diet and exercise for weight loss

## 2021-08-29 ENCOUNTER — Other Ambulatory Visit: Payer: Self-pay | Admitting: Internal Medicine

## 2021-08-29 DIAGNOSIS — I10 Essential (primary) hypertension: Secondary | ICD-10-CM

## 2021-11-15 ENCOUNTER — Other Ambulatory Visit: Payer: Self-pay

## 2021-11-18 ENCOUNTER — Other Ambulatory Visit (INDEPENDENT_AMBULATORY_CARE_PROVIDER_SITE_OTHER): Payer: Managed Care, Other (non HMO)

## 2021-11-18 ENCOUNTER — Ambulatory Visit (INDEPENDENT_AMBULATORY_CARE_PROVIDER_SITE_OTHER): Payer: Managed Care, Other (non HMO) | Admitting: Nurse Practitioner

## 2021-11-18 ENCOUNTER — Encounter: Payer: Self-pay | Admitting: Nurse Practitioner

## 2021-11-18 VITALS — BP 122/76 | HR 104 | Ht 61.0 in | Wt 247.0 lb

## 2021-11-18 DIAGNOSIS — R1033 Periumbilical pain: Secondary | ICD-10-CM

## 2021-11-18 DIAGNOSIS — K5900 Constipation, unspecified: Secondary | ICD-10-CM | POA: Insufficient documentation

## 2021-11-18 DIAGNOSIS — R140 Abdominal distension (gaseous): Secondary | ICD-10-CM

## 2021-11-18 DIAGNOSIS — R112 Nausea with vomiting, unspecified: Secondary | ICD-10-CM

## 2021-11-18 LAB — CBC WITH DIFFERENTIAL/PLATELET
Basophils Absolute: 0.1 10*3/uL (ref 0.0–0.1)
Basophils Relative: 1 % (ref 0.0–3.0)
Eosinophils Absolute: 0.2 10*3/uL (ref 0.0–0.7)
Eosinophils Relative: 1.9 % (ref 0.0–5.0)
HCT: 42.2 % (ref 36.0–46.0)
Hemoglobin: 14.1 g/dL (ref 12.0–15.0)
Lymphocytes Relative: 18.2 % (ref 12.0–46.0)
Lymphs Abs: 1.8 10*3/uL (ref 0.7–4.0)
MCHC: 33.4 g/dL (ref 30.0–36.0)
MCV: 87.3 fl (ref 78.0–100.0)
Monocytes Absolute: 0.8 10*3/uL (ref 0.1–1.0)
Monocytes Relative: 7.4 % (ref 3.0–12.0)
Neutro Abs: 7.3 10*3/uL (ref 1.4–7.7)
Neutrophils Relative %: 71.5 % (ref 43.0–77.0)
Platelets: 443 10*3/uL — ABNORMAL HIGH (ref 150.0–400.0)
RBC: 4.83 Mil/uL (ref 3.87–5.11)
RDW: 13.5 % (ref 11.5–15.5)
WBC: 10.2 10*3/uL (ref 4.0–10.5)

## 2021-11-18 LAB — COMPREHENSIVE METABOLIC PANEL
ALT: 18 U/L (ref 0–35)
AST: 15 U/L (ref 0–37)
Albumin: 4.1 g/dL (ref 3.5–5.2)
Alkaline Phosphatase: 89 U/L (ref 39–117)
BUN: 26 mg/dL — ABNORMAL HIGH (ref 6–23)
CO2: 24 mEq/L (ref 19–32)
Calcium: 9.8 mg/dL (ref 8.4–10.5)
Chloride: 105 mEq/L (ref 96–112)
Creatinine, Ser: 0.8 mg/dL (ref 0.40–1.20)
GFR: 78.86 mL/min (ref >60.00)
Glucose, Bld: 88 mg/dL (ref 70–99)
Potassium: 4.4 mEq/L (ref 3.5–5.1)
Sodium: 138 mEq/L (ref 135–145)
Total Bilirubin: 0.3 mg/dL (ref 0.2–1.2)
Total Protein: 7.3 g/dL (ref 6.0–8.3)

## 2021-11-18 LAB — LIPASE: Lipase: 18 U/L (ref 11.0–59.0)

## 2021-11-18 NOTE — Progress Notes (Signed)
11/18/2021 Terri Gamble [id number] 03/29/1961   CHIEF COMPLAINT: Pain around the bellybutton, abdominal bloat  HISTORY OF PRESENT ILLNESS: Terri Gamble is a 61 year old female with past medical history of hypertension and diverticulosis.  She presents to our office today for further evaluation regarding abdominal bloat and periumbilical pain which started summer 2022.  She initially thought her abdominal pain was triggered after eating onions or spicy foods such as green chili peppers.  She has increased belching if she eats red sauce.  No heartburn.  She is eating a fairly bland diet and continues to have intermittent central abdominal pain with bloat.  No severe abdominal pain.  No fevers or weight loss. Her dairy intake is limited.  She last took an antibiotic (Z-Pak) for an upper respiratory infection November 2022.  She has intermittent constipation.  She is passing a solid stool most days, stools are sometimes moderate in size and other times small.  Her stools have been a bit harder for the past 3 to 4 days.  No rectal bleeding or black stools.  She is scheduled to see her gynecologist for her annual GYN/pelvic exam next month.  She underwent a colonoscopy by Dr. Russella Dar 06/01/2015 which showed diverticulosis to the sigmoid colon and at the hepatic flexure otherwise was normal.  Wednesday, 11/13/2021 while she was at church in the evening she developed nausea and vomited up clear emesis cold associated with sweats and dizziness.  No chest pain or shortness of breath.  EMS was called and her vital signs and EKG were reported as normal.  Her dizziness went away by the next day.  She drinks 8 ounces of water daily.  She drinks 212 ounce [employer] sodas daily.  Labs 09/12/2020: Alk phos 134.  AST 15.  ALT 22.  Total bili < 0.2. Hg 14.8. Platelets 479.    CMP Latest Ref Rng & Units 08/03/2019 04/15/2018 05/19/2017  Glucose 70 - 99 mg/dL - - 89  BUN 4 - 21 19 - 20  Creatinine 0.5 -  1.1 0.9 0.8 0.83  Sodium 137 - 147 138 - 137  Potassium 3.4 - 5.3 4.6 - 3.8  Chloride 99 - 108 100 - 101  CO2 13 - 22 23(A) - 26  Calcium 8.7 - 10.7 10.3 - 9.8  Total Protein 6.0 - 8.3 g/dL - - 7.2  Total Bilirubin 0.2 - 1.2 mg/dL - - 0.2  Alkaline Phos 25 - 125 130(A) - 115  AST 13 - 35 18 - 17  ALT 7 - 35 16 - 19    CBC Latest Ref Rng & Units 08/03/2019 05/19/2017 02/18/2017  WBC - 9.2 9.6 18.9(H)  Hemoglobin 12.0 - 16.0 14.1 14.1 14.0  Hematocrit 36 - 46 42 41.7 41.5  Platelets 150 - 399 491(A) 439.0(H) 380    RUQ sono 02/18/2017: No evidence of acute abnormality. No evidence of cholelithiasis or acute cholecystitis.  Question hepatic steatosis.  Colonoscopy 06/01/2015 by Dr. Russella Dar: ENDOSCOPIC IMPRESSION: 1. Moderate diverticulosis in the sigmoid colon 2. Mild diverticulosis at the hepatic flexure 3. The examination otherwise appeared normal -Recall colonoscopy 10 years  Past Medical History:  Diagnosis Date   Hypertension    Seasonal allergies    Past Surgical History:  Procedure Laterality Date   ABLATION  2006   CESAREAN SECTION  1977, 1984   COLONOSCOPY  2016   DILATATION & CURETTAGE/HYSTEROSCOPY WITH MYOSURE N/A 07/03/2017   Procedure: DILATATION & CURETTAGE/HYSTEROSCOPY WITH MYOSURE;  Surgeon: Senaida Ores,  Juliann Pulse, MD;  Location: [facility];  Service: Gynecology;  Laterality: N/A;  MD request 30 extra minutes  N/V and headaches post uterine ablation.  Social History: She is divorced.  She has 2 sons.  She previously smoked cigarettes on an off x 20 years, she quite smoking in 2006. Infrequent alcohol use. No drug use.   Family History: family history includes Alzheimer's disease in her paternal grandfather; Cardiomyopathy in her maternal grandfather; Diabetes in her maternal grandmother and mother; Heart disease in her maternal grandfather, maternal grandmother, mother, and paternal grandmother; Hypertension in her maternal grandmother, mother, and paternal grandmother;  Kidney disease in her mother; Lung cancer in her maternal uncle and paternal uncle; Stomach cancer in her paternal uncle; Stroke in her maternal grandmother and paternal grandmother.  Mother had IBS.  No known family history of pancreatic or colon cancer.  Allergies  Allergen Reactions   Codeine Nausea Only   Morphine And Related Other (See Comments)    Headache   Latex Rash     Outpatient Encounter Medications as of 11/18/2021  Medication Sig   Biotin 3 MG TABS Take by mouth.   cholecalciferol (VITAMIN D3) 25 MCG (1000 UNIT) tablet Take 1,000 Units by mouth daily.   fluticasone (FLONASE) 50 MCG/ACT nasal spray Place 2 sprays into both nostrils daily.   ibuprofen (ADVIL,MOTRIN) 200 MG tablet Take 400 mg by mouth 2 (two) times daily.    lisinopril (ZESTRIL) 10 MG tablet TAKE 1 TABLET(10 MG) BY MOUTH DAILY   ondansetron (ZOFRAN-ODT) 4 MG disintegrating tablet Take 1 tablet (4 mg total) by mouth every 8 (eight) hours as needed for nausea or vomiting.   valACYclovir (VALTREX) 500 MG tablet Take 500 mg by mouth 2 (two) times daily as needed (for fever blisters). (Patient not taking: Reported on 09/18/2020)   No facility-administered encounter medications on file as of 11/18/2021.   REVIEW OF SYSTEMS:  Gen: Denies fever, sweats or chills. No weight loss.  CV: Denies chest pain, palpitations or edema. Resp: Denies cough, shortness of breath of hemoptysis.  GI: See HPI. GU : Denies urinary burning, blood in urine, increased urinary frequency or incontinence. MS: Denies joint pain, muscles aches or weakness. Derm: Denies rash, itchiness, skin lesions or unhealing ulcers. Psych: Denies depression, anxiety or memory loss. Heme: Denies bruising, easy bleeding. Neuro:  Denies headaches, dizziness or paresthesias. Endo:  Denies any problems with DM, thyroid or adrenal function.  PHYSICAL EXAM: There were no vitals taken for this visit. BP 122/76    Pulse (!) 104    Ht $R'5\' 1"'LR$  (1.549 m)    Wt 247 lb  (112 kg)    SpO2 97%    BMI 46.67 kg/m  . General: Pleasant 61 year old female in no acute distress. Head: Normocephalic and atraumatic. Eyes:  Sclerae non-icteric, conjunctive pink. Ears: Normal auditory acuity. Mouth: Dentition intact. No ulcers or lesions.  Neck: Supple, no lymphadenopathy or thyromegaly.  Lungs: Clear bilaterally to auscultation without wheezes, crackles or rhonchi. Heart: Regular rate and rhythm. No murmur, rub or gallop appreciated.  Abdomen: Soft, non distended.  Mild tenderness above and below the umbilicus without rebound or guarding.  No palpable mass.  No masses. No hepatosplenomegaly. Normoactive bowel sounds x 4 quadrants.  Rectal: Deferred. Musculoskeletal: Symmetrical with no gross deformities. Skin: Warm and dry. No rash or lesions on visible extremities. Extremities: No edema. Neurological: Alert oriented x 4, no focal deficits.  Psychological:  Alert and cooperative. Normal mood and affect.  ASSESSMENT AND PLAN:  33) 61 year old female with constipation, abdominal bloat and intermittent periumbilical pain -CBC, CMP and lipase level -MiraLAX nightly as tolerated increase stool output -Drink 64 ounces of water daily -Increase dietary fiber intake as tolerated -Reduce Mountain Dew intake -Proceed with GYN appointment next month as planned  2) History of elevated alk phos level with normal total bili, AST/ALT levels -CMP as ordered above.  If alk phos level remains elevated she will require additional laboratory studies to include alk phos isoenzymes, GGT, ANA and AMA levels -Await CTAP result  3) Colon cancer screen.  Colonoscopy 05/2015 identified diverticulosis without evidence of colon polyps. -Next screening colonoscopy due 05/2025  4) N/V with dizziness on 11/13/2021 while she was at church. No chest pain or shortness of breath.  EMS was called and her vital signs and EKG were reported as normal.  Her dizziness went away by the next day.  Heart rate  104 in office today.  Hospital dehydration a contributing factor. -Patient was advised to follow-up with her PCP regarding her episode of nausea/vomiting and dizziness.  She remains tachycardic with a heart rate of 104B/M today.  Recommend further cardiac evaluation -Increase water intake and reduce caffeine intake as noted above  Further GI recommendation to be determined after the above lab and CTAP results reviewed    CC:  Jearld Fenton, NP

## 2021-11-18 NOTE — Progress Notes (Deleted)
? ? ? ?11/18/2021 ?Rondall Allegra Spillane ?[id number] ?1961-08-04 ? ? ?CHIEF COMPLAINT:  ? ?HISTORY OF PRESENT ILLNESS: Terri Gamble is a 61 year old female with past medical history of hypertension ? ?She presents to our office today for further evaluation regarding abdominal bloat and periumbilical pain which started summer 2022.  ? ?She initially thought her abdominal pain was more noticeable after eating onions.  ? ?[place], green [place] peppers trigger pain.  ? ?She had pasta last night with red sauce. Burping. No heartburn.  ? ?Bloat fairly constant, some red meat. Limited dairy.  ? ?URI treated with antibiotic 07/2021. Z pak.  ? ?Some constipated. Passes a stool daily. Last 3 to 4 days stools harder. No blood or black stools. Some days stool a little.  ? ?She last saw next month to see gyn  ? ?Nausea cold sweats dizzy and church Wed evening. BP and HR called EMS. EKG was ok. Dizzy. Vomited liquid clear.  She started feeling better. Mountain dew 2 12 oz ? ? ? ?Labs 09/12/2020: Alk phos 134.  AST 15.  ALT 22.  Total bili < 0.2. Hg 14.8. Platelets 479. ? ?  ?CMP Latest Ref Rng & Units 08/03/2019 04/15/2018 05/19/2017  ?Glucose 70 - 99 mg/dL - - 89  ?BUN 4 - 21 19 - 20  ?Creatinine 0.5 - 1.1 0.9 0.8 0.83  ?Sodium 137 - 147 138 - 137  ?Potassium 3.4 - 5.3 4.6 - 3.8  ?Chloride 99 - 108 100 - 101  ?CO2 13 - 22 23(A) - 26  ?Calcium 8.7 - 10.7 10.3 - 9.8  ?Total Protein 6.0 - 8.3 g/dL - - 7.2  ?Total Bilirubin 0.2 - 1.2 mg/dL - - 0.2  ?Alkaline Phos 25 - 125 130(A) - 115  ?AST 13 - 35 18 - 17  ?ALT 7 - 35 16 - 19  ?  ?CBC Latest Ref Rng & Units 08/03/2019 05/19/2017 02/18/2017  ?WBC - 9.2 9.6 18.9(H)  ?Hemoglobin 12.0 - 16.0 14.1 14.1 14.0  ?Hematocrit 36 - 46 42 41.7 41.5  ?Platelets 150 - 399 491(A) 439.0(H) 380  ?  ?RUQ sono 02/18/2017: ?No evidence of acute abnormality. No evidence of cholelithiasis or ?acute cholecystitis.  ?Question hepatic steatosis. ? ?Colonoscopy 06/01/2015 by Dr. Fuller Plan: ?ENDOSCOPIC  IMPRESSION: ?1. Moderate diverticulosis in the sigmoid colon ?2. Mild diverticulosis at the hepatic flexure ?3. The examination otherwise appeared normal ?-Recall colonoscopy 10 years ? ?Past Medical History:  ?Diagnosis Date  ? Hypertension   ? Seasonal allergies   ? ?Past Surgical History:  ?Procedure Laterality Date  ? ABLATION  2006  ? CESAREAN SECTION  1977, 1984  ? COLONOSCOPY  2016  ? DILATATION & CURETTAGE/HYSTEROSCOPY WITH MYOSURE N/A 07/03/2017  ? Procedure: DILATATION & CURETTAGE/HYSTEROSCOPY WITH MYOSURE;  Surgeon: Paula Compton, MD;  Location: [facility];  Service: Gynecology;  Laterality: N/A;  MD request 30 extra minutes  ?N/V and headaches post uterine ablation. ? ?Social History: She quite smoking in 2006 off and on 20 yrs. Infrequent alcohol use. No drug use.  ? ?Family History:  ? ? reports that she quit smoking about 17 years ago. Her smoking use included cigarettes. She has a 7.50 pack-year smoking history. She has never used smokeless tobacco. She reports current alcohol use. She reports that she does not use drugs. ? ?family history includes Alzheimer's disease in her paternal grandfather; Cardiomyopathy in her maternal grandfather; Diabetes in her maternal grandmother and mother; Heart disease in her maternal grandfather, maternal grandmother, mother, and  paternal grandmother; Hypertension in her maternal grandmother, mother, and paternal grandmother; Kidney disease in her mother; Lung cancer in her maternal uncle and paternal uncle; Stomach cancer in her paternal uncle; Stroke in her maternal grandmother and paternal grandmother. ? ?Allergies  ?Allergen Reactions  ? Codeine Nausea Only  ? Morphine And Related Other (See Comments)  ?  Headache  ? Latex Rash  ? ? ?  ?Outpatient Encounter Medications as of 11/18/2021  ?Medication Sig  ? Biotin 3 MG TABS Take by mouth.  ? cholecalciferol (VITAMIN D3) 25 MCG (1000 UNIT) tablet Take 1,000 Units by mouth daily.  ? fluticasone (FLONASE) 50 MCG/ACT  nasal spray Place 2 sprays into both nostrils daily.  ? ibuprofen (ADVIL,MOTRIN) 200 MG tablet Take 400 mg by mouth 2 (two) times daily.   ? lisinopril (ZESTRIL) 10 MG tablet TAKE 1 TABLET(10 MG) BY MOUTH DAILY  ? ondansetron (ZOFRAN-ODT) 4 MG disintegrating tablet Take 1 tablet (4 mg total) by mouth every 8 (eight) hours as needed for nausea or vomiting.  ? valACYclovir (VALTREX) 500 MG tablet Take 500 mg by mouth 2 (two) times daily as needed (for fever blisters). (Patient not taking: Reported on 09/18/2020)  ? ?No facility-administered encounter medications on file as of 11/18/2021.  ? ? ? ?REVIEW OF SYSTEMS: Gen: Denies fever, sweats or chills. No weight loss.  ?CV: Denies chest pain, palpitations or edema. ?Resp: Denies cough, shortness of breath of hemoptysis.  ?GI: Denies heartburn, dysphagia, stomach or lower abdominal pain. No diarrhea or constipation.  ?GU : Denies urinary burning, blood in urine, increased urinary frequency or incontinence. ?MS: Denies joint pain, muscles aches or weakness. ?Derm: Denies rash, itchiness, skin lesions or unhealing ulcers. ?Psych: Denies depression, anxiety, memory loss, suicidal ideation and confusion. ?Heme: Denies bruising, easy bleeding. ?Neuro:  Denies headaches, dizziness or paresthesias. ?Endo:  Denies any problems with DM, thyroid or adrenal function. ? ?PHYSICAL EXAM: ?There were no vitals taken for this visit. ?BP 122/76   Pulse (!) 104   Ht $R'5\' 1"'UA$  (1.549 m)   Wt 247 lb (112 kg)   SpO2 97%   BMI 46.67 kg/m?  ?. ?General: Well developed ... in no acute distress. ?Head: Normocephalic and atraumatic. ?Eyes:  Sclerae non-icteric, conjunctive pink. ?Ears: Normal auditory acuity. ?Mouth: Dentition intact. No ulcers or lesions.  ?Neck: Supple, no lymphadenopathy or thyromegaly.  ?Lungs: Clear bilaterally to auscultation without wheezes, crackles or rhonchi. ?Heart: Regular rate and rhythm. No murmur, rub or gallop appreciated.  ?Abdomen: Soft, nontender, non distended.  No masses. No hepatosplenomegaly. Normoactive bowel sounds x 4 quadrants.  ?Rectal:  ?Musculoskeletal: Symmetrical with no gross deformities. ?Skin: Warm and dry. No rash or lesions on visible extremities. ?Extremities: No edema. ?Neurological: Alert oriented x 4, no focal deficits.  ?Psychological:  Alert and cooperative. Normal mood and affect. ? ?ASSESSMENT AND PLAN: ? ?12) 61 year old female with abdominal bloat ? ?2) History of elevated alk phos level with normal total bili, AST/ALT levels ?-Hepatic panel ? ?3) Colon cancer screen.  Colonoscopy 05/2015 identified diverticulosis without evidence of colon polyps. ?-Next screening colonoscopy due 05/2025 ? ? ? ?CC:  Jearld Fenton, NP ? ? ? ?

## 2021-11-18 NOTE — Patient Instructions (Addendum)
You will be contacted by [facility] (Your caller ID will indicate phone # [phone]) in the next 7 days to schedule your CT Scan. If you have not heard from them within 7 business days, please call [facility] at [phone] to follow up on the status of your appointment.   ? ?RECOMMENDATIONS: ? ?Miralax- Dissolve one capful in 8 ounces of water and drink before bed. ?Drink 64 oz water daily. ?Reduce caffeine intake, wean off MT Dew. ?Increase dietary fiber as tolerated. ?Follow up with your primary care provider regarding dizziness and elevated heart rate. ? ?Please proceed to the basement level for lab work before leaving today. Press "B" on the elevator. The lab is located at the first door on the left as you exit the elevator. ? ?HEALTHCARE LAWS AND MY CHART RESULTS:  ? ?Due to recent changes in healthcare laws, you may see results of your imaging and/or laboratory studies on MyChart before I have had a chance to review them.  I understand that in some cases there may be results that are confusing or concerning to you. Please understand that not all results are received at the same time and often I may need to interpret multiple results in order to provide you with the best plan of care or course of treatment. Therefore, I ask that you please give me 48 hours to thoroughly review all your results before contacting my office for clarification.  ? ?Thank you for trusting me with your gastrointestinal care!   ? ?Noralyn Pick, CRNP ? ? ? ?BMI: ? ?If you are age 65 or older, your body mass index should be between 23-30. Your Body mass index is 46.67 kg/m?Marland Kitchen If this is out of the aforementioned range listed, please consider follow up with your Primary Care Provider. ? ?If you are age 37 or younger, your body mass index should be between 19-25. Your Body mass index is 46.67 kg/m?Marland Kitchen If this is out of the aformentioned range listed, please consider follow up with  your Primary Care Provider.  ? ?MY CHART: ? ?The [facility] providers would like to encourage you to use [facility] to communicate with providers for non-urgent requests or questions.  Due to long hold times on the telephone, sending your provider a message by [facility] may be a faster and more efficient way to get a response.  Please allow 48 business hours for a response.  Please remember that this is for non-urgent requests.  ? ? ?

## 2021-11-18 NOTE — Progress Notes (Signed)
RADIOLOGY SCHEDULING REQUEST FOR CT Scan sent to [facility] Scheduling via secure staff message. ?Reminder set to ensure appointment gets scheduled. ? ? ? ?

## 2021-11-22 ENCOUNTER — Telehealth: Payer: Self-pay | Admitting: Nurse Practitioner

## 2021-11-22 NOTE — Telephone Encounter (Signed)
Case has been denied by [employer] since a negative ultrasound should have failed first to find the source of the problem, a peer to peer is an option to schedule, please advise. ? ?Case# [id number] ?Peer to peer: [phone] ?Opt 4 ?Say "[employer]" ?Opt 1 ?Opt 1 ? ?

## 2021-11-25 ENCOUNTER — Ambulatory Visit: Payer: Managed Care, Other (non HMO)

## 2021-11-27 ENCOUNTER — Other Ambulatory Visit: Payer: Self-pay | Admitting: Internal Medicine

## 2021-11-27 DIAGNOSIS — I10 Essential (primary) hypertension: Secondary | ICD-10-CM

## 2021-11-27 NOTE — Telephone Encounter (Signed)
Melissa, can you please contact the patient and let her know her insurance has not authorized the CTAP.  I have scheduled a peer to peer review on Monday 12/02/2021 as this process could not be done at this time.  I will be in the hospital the next 2 days therefore I cannot schedule a peer to peer review while working in the hospital setting. ? ?Please inform the patient if she has any severe abdominal pain she should go to the emergency room for further evaluation.  ?

## 2021-11-29 ENCOUNTER — Other Ambulatory Visit: Payer: Self-pay | Admitting: Internal Medicine

## 2021-11-29 DIAGNOSIS — I10 Essential (primary) hypertension: Secondary | ICD-10-CM

## 2021-11-29 NOTE — Telephone Encounter (Signed)
Requested Prescriptions  ?Pending Prescriptions Disp Refills  ?? lisinopril (ZESTRIL) 10 MG tablet [Pharmacy Med Name: LISINOPRIL '10MG'$  TABLETS] 30 tablet 0  ?  Sig: TAKE 1 TABLET(10 MG) BY MOUTH DAILY  ?  ? Cardiovascular:  ACE Inhibitors Failed - 11/29/2021  8:24 AM  ?  ?  Failed - Valid encounter within last 6 months  ?  Recent Outpatient Visits   ?      ? 3 months ago Viral URI with cough  ? [facility], Coralie Keens, NP  ? 1 year ago Viral infection  ? [place], FNP  ? 1 year ago Essential hypertension  ? [place], FNP  ? 1 year ago Acute non-recurrent sinusitis, unspecified location  ? [place], FNP  ? 2 years ago Anxiety  ? [place], DO  ?  ?  ?Future Appointments   ?        ? In 1 week [facility], Coralie Keens, NP [place]  ?  ? ?  ?  ?  Passed - Cr in normal range and within 180 days  ?  Creatinine  ?Date Value Ref Range Status  ?12/11/2011 0.81 0.60 - 1.30 mg/dL Final  ? ?Creatinine, Ser  ?Date Value Ref Range Status  ?11/18/2021 0.80 0.40 - 1.20 mg/dL Final  ?   ?  ?  Passed - K in normal range and within 180 days  ?  Potassium  ?Date Value Ref Range Status  ?11/18/2021 4.4 3.5 - 5.1 mEq/L Final  ?12/11/2011 4.1 3.5 - 5.1 mmol/L Final  ?   ?  ?  Passed - Patient is not pregnant  ?  ?  Passed - Last BP in normal range  ?  BP Readings from Last 1 Encounters:  ?11/18/21 122/76  ?   ?  ?  ? ?

## 2021-11-29 NOTE — Telephone Encounter (Signed)
Pt is calling to check on the status of medication refill. Pt states that she took her last pill yesterday. ?Please advise CB- [phone] ?

## 2021-11-29 NOTE — Telephone Encounter (Signed)
Patient called and advised she will need an OV for annual visit per note of [place]. She says she already had blood work and Rollene Fare can see her BP in her MyChart, not sure why she has to be seen for refill of medication. I advised the providers require patients to be seen at least every 6 months to 1 year depending on the preference to monitor the vitals, blood work, and to make sure the medication is working as it should for the patient. She says well if that's the only way to get my refill, go ahead and schedule. Advised a 30 day supply will be sent to the pharmacy to last until the appointment. Appointment scheduled for 12/10/21 at 1520 with [facility] for CPE.  ?

## 2021-12-02 ENCOUNTER — Other Ambulatory Visit: Payer: Self-pay

## 2021-12-02 ENCOUNTER — Telehealth: Payer: Self-pay | Admitting: Nurse Practitioner

## 2021-12-02 DIAGNOSIS — R748 Abnormal levels of other serum enzymes: Secondary | ICD-10-CM

## 2021-12-02 NOTE — Telephone Encounter (Signed)
See other phone note entry today. ?

## 2021-12-02 NOTE — Telephone Encounter (Signed)
Requested medications are due for refill today.  no ? ?Requested medications are on the active medications list.  yes ? ?Last refill. 11/29/2021 #30 0 refills - courtesy refill ? ?Future visit scheduled.   yes ? ?Notes to clinic.  Medication refilled 11/29/2021 - pt given enough medication to last until office visit. ? ? ? ?Requested Prescriptions  ?Pending Prescriptions Disp Refills  ? lisinopril (ZESTRIL) 10 MG tablet [Pharmacy Med Name: LISINOPRIL '10MG'$  TABLETS] 90 tablet 0  ?  Sig: TAKE 1 TABLET(10 MG) BY MOUTH DAILY  ?  ? Cardiovascular:  ACE Inhibitors Failed - 11/29/2021  3:43 PM  ?  ?  Failed - Valid encounter within last 6 months  ?  Recent Outpatient Visits   ? ?      ? 3 months ago Viral URI with cough  ? [facility], Coralie Keens, NP  ? 1 year ago Viral infection  ? [place], FNP  ? 1 year ago Essential hypertension  ? [place], FNP  ? 1 year ago Acute non-recurrent sinusitis, unspecified location  ? [place], FNP  ? 2 years ago Anxiety  ? [place], DO  ? ?  ?  ?Future Appointments   ? ?        ? In 1 week Baity, Coralie Keens, NP [place]  ? ?  ? ?  ?  ?  Passed - Cr in normal range and within 180 days  ?  Creatinine  ?Date Value Ref Range Status  ?12/11/2011 0.81 0.60 - 1.30 mg/dL Final  ? ?Creatinine, Ser  ?Date Value Ref Range Status  ?11/18/2021 0.80 0.40 - 1.20 mg/dL Final  ?  ?  ?  ?  Passed - K in normal range and within 180 days  ?  Potassium  ?Date Value Ref Range Status  ?11/18/2021 4.4 3.5 - 5.1 mEq/L Final  ?12/11/2011 4.1 3.5 - 5.1 mmol/L Final  ?  ?  ?  ?  Passed - Patient is not pregnant  ?  ?  Passed - Last BP in normal range  ?  BP Readings from Last 1 Encounters:  ?11/18/21 122/76  ?  ?  ?  ?  ?  ?

## 2021-12-02 NOTE — Telephone Encounter (Signed)
Amy, please contact the patient let her know I had a peer to peer review with her health insurance today and her CTAP was denied.  I was informed and abdominal ultrasound needs to be done prior to pursuing CTAP imaging.  I did not initially order an abdominal ultrasound because the patient has periumbilical pain (an abdominal ultrasound does not evaluate this area). However, the physician on the peer to peer review board stated abdominal sonogram needs to be done to rule out any abdominal ascites or liver lesion and periumbilical pain was not specific. ? ? ?Please schedule her for a RUQ abdominal sonogram to further evaluate her liver which is appropriate with mildly elevated alk phos levels even though the repeat level was normal. ? ?Further recommendations will be determined after the abdominal sonogram results received.  Let me know if patient is willing to proceed with abdominal sonogram.  Thank you ?

## 2021-12-02 NOTE — Telephone Encounter (Signed)
Called pt and informed of Carl Best, CRNP's conversation with physician during peer to peer. Verbalized acceptance and understanding rationale for [place] prior to CT and is agreeable to proceeding with [place]. Orders placed for RUQ [place]. Message sent to [facility] Scheduling for scheduling purposes. Pt aware of scheduling process and provided with contact # if she has not heard from [employer] within the next 2-3 business days. Routing this message to Carl Best, CRNP per her request. ?

## 2021-12-10 ENCOUNTER — Encounter: Payer: Self-pay | Admitting: Internal Medicine

## 2021-12-10 ENCOUNTER — Ambulatory Visit (INDEPENDENT_AMBULATORY_CARE_PROVIDER_SITE_OTHER): Payer: Managed Care, Other (non HMO) | Admitting: Internal Medicine

## 2021-12-10 DIAGNOSIS — I10 Essential (primary) hypertension: Secondary | ICD-10-CM

## 2021-12-10 DIAGNOSIS — K5901 Slow transit constipation: Secondary | ICD-10-CM | POA: Diagnosis not present

## 2021-12-10 MED ORDER — LISINOPRIL 10 MG PO TABS: 5.0000 mg | ORAL_TABLET | Freq: Every day | ORAL | 3 refills | Status: DC

## 2021-12-10 MED ORDER — LISINOPRIL 10 MG PO TABS: 10.0000 mg | ORAL_TABLET | Freq: Every day | ORAL | 3 refills | Status: DC

## 2021-12-10 NOTE — Assessment & Plan Note (Signed)
Encouraged high-fiber diet and adequate water intake ?Continue MiraLAX as needed ?

## 2021-12-10 NOTE — Assessment & Plan Note (Signed)
Encourage diet and exercise for weight loss 

## 2021-12-10 NOTE — Assessment & Plan Note (Signed)
We will try to decrease Lisinopril to 5 mg daily ?Reinforced DASH diet and exercise for weight loss ?We will monitor ?

## 2021-12-10 NOTE — Progress Notes (Addendum)
? ?Subjective:  ? ? Patient ID: Terri Gamble, female    DOB: 10-Feb-1961, 61 y.o.   MRN: [id number] ? ?HPI ? ?Patient presents to clinic today for her annual exam. ? ?Flu: 06/2021 ?Tetanus: unsure ?COVID: Pfizer x2 ?Shingrix: never ?Pap smear: 05/2018, scheduled 01/2021 ?Mammogram: scheduled 01/2021 ?Bone density: never ?Colon screening: 05/2015 ?Vision screening: as needed ?Dentist: biannually ? ?Diet: She does eat some meat. She consumes fruits and veggies. She tries to avoid fried foods. She drinks some water. ?Exercise: None ? ?Review of Systems ? ?   ?Past Medical History:  ?Diagnosis Date  ? Hypertension   ? Seasonal allergies   ? ? ?Current Outpatient Medications  ?Medication Sig Dispense Refill  ? Biotin 3 MG TABS Take by mouth.    ? cholecalciferol (VITAMIN D3) 25 MCG (1000 UNIT) tablet Take 1,000 Units by mouth daily.    ? ibuprofen (ADVIL,MOTRIN) 200 MG tablet Take 400 mg by mouth every 6 (six) hours as needed.    ? lisinopril (ZESTRIL) 10 MG tablet TAKE 1 TABLET(10 MG) BY MOUTH DAILY 30 tablet 0  ? ondansetron (ZOFRAN-ODT) 4 MG disintegrating tablet Take 1 tablet (4 mg total) by mouth every 8 (eight) hours as needed for nausea or vomiting. 20 tablet 0  ? valACYclovir (VALTREX) 500 MG tablet Take 500 mg by mouth 2 (two) times daily as needed (for fever blisters).    ? ?No current facility-administered medications for this visit.  ? ? ?Allergies  ?Allergen Reactions  ? Codeine Nausea Only  ? Morphine And Related Other (See Comments)  ?  Headache  ? Latex Rash  ? ? ?Family History  ?Problem Relation Age of Onset  ? Heart disease Mother   ? Kidney disease Mother   ? Hypertension Mother   ? Diabetes Mother   ? Diabetes Maternal Grandmother   ? Hypertension Maternal Grandmother   ? Heart disease Maternal Grandmother   ? Stroke Maternal Grandmother   ? Stomach cancer Paternal Uncle   ?     x 2  ? Lung cancer Paternal Uncle   ? Lung cancer Maternal Uncle   ? Heart disease Maternal Grandfather   ? Cardiomyopathy  Maternal Grandfather   ? Heart disease Paternal Grandmother   ? Stroke Paternal Grandmother   ? Hypertension Paternal Grandmother   ? Alzheimer's disease Paternal Grandfather   ? Colon cancer Neg Hx   ? Pancreatic cancer Neg Hx   ? Esophageal cancer Neg Hx   ? Liver disease Neg Hx   ? Ovarian cancer Neg Hx   ? Breast cancer Neg Hx   ? ? ?Social History  ? ?Socioeconomic History  ? Marital status: Divorced  ?  Spouse name: Not on file  ? Number of children: Not on file  ? Years of education: Not on file  ? Highest education level: Not on file  ?Occupational History  ? Not on file  ?Tobacco Use  ? Smoking status: Former  ?  Packs/day: 0.50  ?  Years: 15.00  ?  Pack years: 7.50  ?  Types: Cigarettes  ?  Quit date: 09/08/2004  ?  Years since quitting: 17.2  ? Smokeless tobacco: Never  ?Vaping Use  ? Vaping Use: Never used  ?Substance and Sexual Activity  ? Alcohol use: Yes  ?  Alcohol/week: 0.0 standard drinks  ?  Comment: occasionally   ? Drug use: No  ? Sexual activity: Yes  ?  Birth control/protection: Post-menopausal  ?Other Topics Concern  ?  Not on file  ?Social History Narrative  ? Not on file  ? ?Social Determinants of Health  ? ?Financial Resource Strain: Not on file  ?Food Insecurity: Not on file  ?Transportation Needs: Not on file  ?Physical Activity: Not on file  ?Stress: Not on file  ?Social Connections: Not on file  ?Intimate Partner Violence: Not on file  ? ? ? ?Constitutional: Denies fever, malaise, fatigue, headache or abrupt weight changes.  ?HEENT: Denies eye pain, eye redness, ear pain, ringing in the ears, wax buildup, runny nose, nasal congestion, bloody nose, or sore throat. ?Respiratory: Denies difficulty breathing, shortness of breath, cough or sputum production.   ?Cardiovascular: Denies chest pain, chest tightness, palpitations or swelling in the hands or feet.  ?Gastrointestinal: Patient reports constipation.  Denies abdominal pain, bloating, diarrhea or blood in the stool.  ?GU: Denies urgency,  frequency, pain with urination, burning sensation, blood in urine, odor or discharge. ?Musculoskeletal: Patient reports intermittent knee pain.  Denies decrease in range of motion, difficulty with gait, muscle pain or joint swelling.  ?Skin: Denies redness, rashes, lesions or ulcercations.  ?Neurological: Denies dizziness, difficulty with memory, difficulty with speech or problems with balance and coordination.  ?Psych: Denies anxiety, depression, SI/HI. ? ?No other specific complaints in a complete review of systems (except as listed in HPI above). ? ?Objective:  ? Physical Exam ?BP 110/68 (BP Location: Left Arm, Patient Position: Sitting, Cuff Size: Large)   Pulse (!) 105   Temp (!) 97.3 ?F (36.3 ?C) (Temporal)   Ht $R'5\' 1"'Rn$  (1.549 m)   Wt 239 lb (108.4 kg)   SpO2 99%   BMI 45.16 kg/m?  ? ?Wt Readings from Last 3 Encounters:  ?11/18/21 247 lb (112 kg)  ?06/25/20 240 lb 9.6 oz (109.1 kg)  ?10/18/18 212 lb 6.4 oz (96.3 kg)  ? ? ?General: Appears her stated age, obese, in NAD. ?Skin: Warm, dry and intact.  ?HEENT: Head: normal shape and size; Eyes: sclera white, and EOMs intact;  ?Neck:  Neck supple, trachea midline. No masses, lumps or thyromegaly present.  ?Cardiovascular: Tachycardic with normal rhythm. S1,S2 noted.  No murmur, rubs or gallops noted. No JVD or BLE edema. No carotid bruits noted. ?Pulmonary/Chest: Normal effort and positive vesicular breath sounds. No respiratory distress. No wheezes, rales or ronchi noted.  ?Abdomen: Soft and nontender. Normal bowel sounds.  ?Musculoskeletal: Strength 5/5 BUE/BLE.  No difficulty with gait.  ?Neurological: Alert and oriented. Cranial nerves II-XII grossly intact. Coordination normal.  ?Psychiatric: Mood and affect normal. Behavior is normal. Judgment and thought content normal.  ? ? ? ?BMET ?   ?Component Value Date/Time  ? NA 138 11/18/2021 1040  ? NA 138 08/03/2019 0000  ? NA 142 12/11/2011 1941  ? K 4.4 11/18/2021 1040  ? K 4.1 12/11/2011 1941  ? CL 105  11/18/2021 1040  ? CL 104 12/11/2011 1941  ? CO2 24 11/18/2021 1040  ? CO2 27 12/11/2011 1941  ? GLUCOSE 88 11/18/2021 1040  ? GLUCOSE 83 12/11/2011 1941  ? BUN 26 (H) 11/18/2021 1040  ? BUN 19 08/03/2019 0000  ? BUN 20 (H) 12/11/2011 1941  ? CREATININE 0.80 11/18/2021 1040  ? CREATININE 0.81 12/11/2011 1941  ? CALCIUM 9.8 11/18/2021 1040  ? CALCIUM 9.1 12/11/2011 1941  ? GFRNONAA >60 02/18/2017 1109  ? GFRNONAA >60 12/11/2011 1941  ? GFRAA >60 02/18/2017 1109  ? GFRAA >60 12/11/2011 1941  ? ? ?Lipid Panel  ?   ?Component Value Date/Time  ?  CHOL 205 (A) 08/03/2019 0000  ? TRIG 91 08/03/2019 0000  ? HDL 66 08/03/2019 0000  ? [place] 123 08/03/2019 0000  ? ? ?CBC ?   ?Component Value Date/Time  ? WBC 10.2 11/18/2021 1040  ? RBC 4.83 11/18/2021 1040  ? HGB 14.1 11/18/2021 1040  ? HGB 14.4 12/11/2011 1941  ? HCT 42.2 11/18/2021 1040  ? HCT 42.4 12/11/2011 1941  ? PLT 443.0 (H) 11/18/2021 1040  ? PLT 371 12/11/2011 1941  ? MCV 87.3 11/18/2021 1040  ? MCV 88 12/11/2011 1941  ? MCH 29.0 02/18/2017 1109  ? MCHC 33.4 11/18/2021 1040  ? RDW 13.5 11/18/2021 1040  ? RDW 13.3 12/11/2011 1941  ? LYMPHSABS 1.8 11/18/2021 1040  ? MONOABS 0.8 11/18/2021 1040  ? EOSABS 0.2 11/18/2021 1040  ? BASOSABS 0.1 11/18/2021 1040  ? ? ?Hgb A1C ?Lab Results  ?Component Value Date  ? HGBA1C 5.4 04/15/2018  ? ? ? ? ? ? ? ?   ?Assessment & Plan:  ? ?Preventative Health Maintenance: ? ?Encouraged her to get a flu shot in the fall ?She declines tetanus booster today ?Encouraged her to get her COVID booster ?Discussed Shingrix vaccine, she will check coverage with her insurance company ?Pap smear UTD ?Mammogram has been scheduled by her GYN ?Advised her to ask her GYN about a bone density exam ?Colon screening UTD ?Encouraged her to consume a balanced diet and exercise regimen ?Advised her to see an eye doctor and dentist annually ?CBC and c-Met reviewed.  She has lipid panel and A1c from 04/2021 that she will upload to her MyChart ? ?RTC in 1 year,  sooner if needed ?Webb Silversmith, NP ? ? ?

## 2021-12-10 NOTE — Patient Instructions (Signed)
Health Maintenance for Postmenopausal Women ?Menopause is a normal process in which your ability to get pregnant comes to an end. This process happens slowly over many months or years, usually between the ages of 48 and 55. Menopause is complete when you have missed your menstrual period for 12 months. ?It is important to talk with your health care provider about some of the most common conditions that affect women after menopause (postmenopausal women). These include heart disease, cancer, and bone loss (osteoporosis). Adopting a healthy lifestyle and getting preventive care can help to promote your health and wellness. The actions you take can also lower your chances of developing some of these common conditions. ?What are the signs and symptoms of menopause? ?During menopause, you may have the following symptoms: ?Hot flashes. These can be moderate or severe. ?Night sweats. ?Decrease in sex drive. ?Mood swings. ?Headaches. ?Tiredness (fatigue). ?Irritability. ?Memory problems. ?Problems falling asleep or staying asleep. ?Talk with your health care provider about treatment options for your symptoms. ?Do I need hormone replacement therapy? ?Hormone replacement therapy is effective in treating symptoms that are caused by menopause, such as hot flashes and night sweats. ?Hormone replacement carries certain risks, especially as you become older. If you are thinking about using estrogen or estrogen with progestin, discuss the benefits and risks with your health care provider. ?How can I reduce my risk for heart disease and stroke? ?The risk of heart disease, heart attack, and stroke increases as you age. One of the causes may be a change in the body's hormones during menopause. This can affect how your body uses dietary fats, triglycerides, and cholesterol. Heart attack and stroke are medical emergencies. There are many things that you can do to help prevent heart disease and stroke. ?Watch your blood pressure ?High  blood pressure causes heart disease and increases the risk of stroke. This is more likely to develop in people who have high blood pressure readings or are overweight. ?Have your blood pressure checked: ?Every 3-5 years if you are 18-39 years of age. ?Every year if you are 40 years old or older. ?Eat a healthy diet ? ?Eat a diet that includes plenty of vegetables, fruits, low-fat dairy products, and lean protein. ?Do not eat a lot of foods that are high in solid fats, added sugars, or sodium. ?Get regular exercise ?Get regular exercise. This is one of the most important things you can do for your health. Most adults should: ?Try to exercise for at least 150 minutes each week. The exercise should increase your heart rate and make you sweat (moderate-intensity exercise). ?Try to do strengthening exercises at least twice each week. Do these in addition to the moderate-intensity exercise. ?Spend less time sitting. Even light physical activity can be beneficial. ?Other tips ?Work with your health care provider to achieve or maintain a healthy weight. ?Do not use any products that contain nicotine or tobacco. These products include cigarettes, chewing tobacco, and vaping devices, such as e-cigarettes. If you need help quitting, ask your health care provider. ?Know your numbers. Ask your health care provider to check your cholesterol and your blood sugar (glucose). Continue to have your blood tested as directed by your health care provider. ?Do I need screening for cancer? ?Depending on your health history and family history, you may need to have cancer screenings at different stages of your life. This may include screening for: ?Breast cancer. ?Cervical cancer. ?Lung cancer. ?Colorectal cancer. ?What is my risk for osteoporosis? ?After menopause, you may be   at increased risk for osteoporosis. Osteoporosis is a condition in which bone destruction happens more quickly than new bone creation. To help prevent osteoporosis or  the bone fractures that can happen because of osteoporosis, you may take the following actions: ?If you are 19-50 years old, get at least 1,000 mg of calcium and at least 600 international units (IU) of vitamin D per day. ?If you are older than age 50 but younger than age 70, get at least 1,200 mg of calcium and at least 600 international units (IU) of vitamin D per day. ?If you are older than age 70, get at least 1,200 mg of calcium and at least 800 international units (IU) of vitamin D per day. ?Smoking and drinking excessive alcohol increase the risk of osteoporosis. Eat foods that are rich in calcium and vitamin D, and do weight-bearing exercises several times each week as directed by your health care provider. ?How does menopause affect my mental health? ?Depression may occur at any age, but it is more common as you become older. Common symptoms of depression include: ?Feeling depressed. ?Changes in sleep patterns. ?Changes in appetite or eating patterns. ?Feeling an overall lack of motivation or enjoyment of activities that you previously enjoyed. ?Frequent crying spells. ?Talk with your health care provider if you think that you are experiencing any of these symptoms. ?General instructions ?See your health care provider for regular wellness exams and vaccines. This may include: ?Scheduling regular health, dental, and eye exams. ?Getting and maintaining your vaccines. These include: ?Influenza vaccine. Get this vaccine each year before the flu season begins. ?Pneumonia vaccine. ?Shingles vaccine. ?Tetanus, diphtheria, and pertussis (Tdap) booster vaccine. ?Your health care provider may also recommend other immunizations. ?Tell your health care provider if you have ever been abused or do not feel safe at home. ?Summary ?Menopause is a normal process in which your ability to get pregnant comes to an end. ?This condition causes hot flashes, night sweats, decreased interest in sex, mood swings, headaches, or lack  of sleep. ?Treatment for this condition may include hormone replacement therapy. ?Take actions to keep yourself healthy, including exercising regularly, eating a healthy diet, watching your weight, and checking your blood pressure and blood sugar levels. ?Get screened for cancer and depression. Make sure that you are up to date with all your vaccines. ?This information is not intended to replace advice given to you by your health care provider. Make sure you discuss any questions you have with your health care provider. ?Document Revised: 01/14/2021 Document Reviewed: 01/14/2021 ?Elsevier Patient Education ? 2022 [employer] ? ?

## 2021-12-13 ENCOUNTER — Ambulatory Visit (HOSPITAL_COMMUNITY): Payer: Managed Care, Other (non HMO)

## 2021-12-16 ENCOUNTER — Telehealth: Payer: Self-pay

## 2021-12-16 NOTE — Telephone Encounter (Signed)
Pt was scheduled [place] on 12/13/21 but appears she canceled with no reason provided. Called pt to inquire further and to provide with contact # for self scheduling. LVM requesting returned call. ?

## 2021-12-17 NOTE — Telephone Encounter (Signed)
Called pt to inquire further about cancellation. States she was informed she owed ~ 700 dollars before she could have the [place]. Advised she is welcome to advise staff that exam can be billed to insurance and that she will await EOB and finalized bill and pay what she is responsible for at that time. Verbalized acceptance and understanding. States she will call to reschedule. ?

## 2022-01-16 ENCOUNTER — Telehealth: Payer: Self-pay | Admitting: Nurse Practitioner

## 2022-01-16 NOTE — Telephone Encounter (Signed)
Returned pt call. LVM requesting returned call. 

## 2022-01-16 NOTE — Telephone Encounter (Signed)
Review of pt chart indicates peer to peer was completed and response was received below: ? ?However, the physician on the peer to peer review board stated abdominal sonogram needs to be done to rule out any abdominal ascites or liver lesion and periumbilical pain was not specific. ? ?Unfortunately, this will not change approval. Called pt to make her aware that she must proceed as directed by her insurance. LVM asking for returned call. ?

## 2022-01-16 NOTE — Telephone Encounter (Signed)
Patient called and stated she just found out that she has a family history of pancreatic cancer.  She wanted to know if this would make a difference as previously her insurance declined the CT scan that was ordered for her.  She didn't know if it was resubmitted with that diagnosis if they would accept it, or if not, do you want her to go ahead and reschedule her ultrasound?  Please call patient and advise.  Thank you. ?

## 2022-01-16 NOTE — Telephone Encounter (Signed)
Pt returned call. Informed pt that unfortunately, this would not change previous. Advised she will need to proceed with [place] prior to CT as directed by provider of her insurance plan during peer to peer. Verbalized acceptance and understanding. States she will call to reschedule. ?

## 2022-01-16 NOTE — Telephone Encounter (Signed)
Patient returned your call.

## 2022-01-20 ENCOUNTER — Ambulatory Visit (HOSPITAL_COMMUNITY)
Admission: RE | Admit: 2022-01-20 | Discharge: 2022-01-20 | Disposition: A | Discharge status: Home / Self Care | Payer: Managed Care, Other (non HMO) | Source: Ambulatory Visit | Attending: Nurse Practitioner | Admitting: Nurse Practitioner

## 2022-01-20 DIAGNOSIS — R748 Abnormal levels of other serum enzymes: Secondary | ICD-10-CM | POA: Insufficient documentation

## 2022-01-21 LAB — HM PAP SMEAR

## 2022-01-21 LAB — RESULTS CONSOLE HPV: CHL HPV: NEGATIVE

## 2022-01-21 LAB — HM MAMMOGRAPHY

## 2022-01-28 NOTE — Telephone Encounter (Signed)
Addressed in previously created encounter 

## 2022-01-28 NOTE — Telephone Encounter (Signed)
Patient returning your call.

## 2022-02-21 ENCOUNTER — Ambulatory Visit: Payer: Managed Care, Other (non HMO) | Admitting: Internal Medicine

## 2022-02-21 ENCOUNTER — Encounter: Payer: Self-pay | Admitting: Internal Medicine

## 2022-02-21 VITALS — BP 128/70 | HR 105 | Temp 97.1°F | Ht 61.0 in | Wt 247.0 lb

## 2022-02-21 DIAGNOSIS — I10 Essential (primary) hypertension: Secondary | ICD-10-CM

## 2022-02-21 DIAGNOSIS — R7303 Prediabetes: Secondary | ICD-10-CM | POA: Insufficient documentation

## 2022-02-21 DIAGNOSIS — D75839 Thrombocytosis, unspecified: Secondary | ICD-10-CM | POA: Insufficient documentation

## 2022-02-21 MED ORDER — OZEMPIC (0.25 OR 0.5 MG/DOSE) 2 MG/3ML ~~LOC~~ SOPN: 0.5000 mg | PEN_INJECTOR | SUBCUTANEOUS | 0 refills | Status: DC

## 2022-02-21 MED ORDER — INSULIN PEN NEEDLE 31G X 5 MM MISC: 0 refills | Status: DC

## 2022-02-21 NOTE — Progress Notes (Signed)
Subjective:    Patient ID: Terri Gamble, female    DOB: 28-Jul-1961, 60 y.o.   MRN: [id number]  HPI  Patient presents to clinic today to discuss her weight loss options. She would like to try Ozempic or Wegovy.  Her current weight is 247 LBS with a BMI of 46.67.  She does have a history of HTN and prediabetes, last A1C was 5.7 04/2024.  Review of Systems     Past Medical History:  Diagnosis Date   Hypertension    Seasonal allergies     Current Outpatient Medications  Medication Sig Dispense Refill   Biotin 3 MG TABS Take by mouth.     cholecalciferol (VITAMIN D3) 25 MCG (1000 UNIT) tablet Take 1,000 Units by mouth daily.     ibuprofen (ADVIL,MOTRIN) 200 MG tablet Take 400 mg by mouth every 6 (six) hours as needed.     lisinopril (ZESTRIL) 10 MG tablet Take 0.5 tablets (5 mg total) by mouth daily. 90 tablet 3   ondansetron (ZOFRAN-ODT) 4 MG disintegrating tablet Take 1 tablet (4 mg total) by mouth every 8 (eight) hours as needed for nausea or vomiting. 20 tablet 0   valACYclovir (VALTREX) 500 MG tablet Take 500 mg by mouth 2 (two) times daily as needed (for fever blisters).     No current facility-administered medications for this visit.    Allergies  Allergen Reactions   Codeine Nausea Only   Morphine And Related Other (See Comments)    Headache   Latex Rash    Family History  Problem Relation Age of Onset   Heart disease Mother    Kidney disease Mother    Hypertension Mother    Diabetes Mother    Diabetes Maternal Grandmother    Hypertension Maternal Grandmother    Heart disease Maternal Grandmother    Stroke Maternal Grandmother    Heart disease Maternal Grandfather    Cardiomyopathy Maternal Grandfather    Heart disease Paternal Grandmother    Stroke Paternal Grandmother    Hypertension Paternal Grandmother    Alzheimer's disease Paternal Grandfather    Lung cancer Maternal Uncle    Stomach cancer Paternal Uncle        x 2   Lung cancer Paternal Uncle     Pancreatic cancer Maternal Aunt    Colon cancer Neg Hx    Esophageal cancer Neg Hx    Liver disease Neg Hx    Ovarian cancer Neg Hx    Breast cancer Neg Hx     Social History   Socioeconomic History   Marital status: Divorced    Spouse name: Not on file   Number of children: Not on file   Years of education: Not on file   Highest education level: Not on file  Occupational History   Not on file  Tobacco Use   Smoking status: Former    Packs/day: 0.50    Years: 15.00    Total pack years: 7.50    Types: Cigarettes    Quit date: 09/08/2004    Years since quitting: 17.4   Smokeless tobacco: Never  Vaping Use   Vaping Use: Never used  Substance and Sexual Activity   Alcohol use: Yes    Alcohol/week: 0.0 standard drinks of alcohol    Comment: occasionally    Drug use: No   Sexual activity: Yes    Birth control/protection: Post-menopausal  Other Topics Concern   Not on file  Social History Narrative   Not on  file   Social Determinants of Health   Financial Resource Strain: Not on file  Food Insecurity: Not on file  Transportation Needs: Not on file  Physical Activity: Not on file  Stress: Not on file  Social Connections: Not on file  Intimate Partner Violence: Not on file     Constitutional: Denies fever, malaise, fatigue, headache or abrupt weight changes.  Respiratory: Denies difficulty breathing, shortness of breath, cough or sputum production.   Cardiovascular: Denies chest pain, chest tightness, palpitations or swelling in the hands or feet.  Neurological: Denies dizziness, difficulty with memory, difficulty with speech or problems with balance and coordination.    No other specific complaints in a complete review of systems (except as listed in HPI above).  Objective:   Physical Exam  BP 128/70 (BP Location: Left Arm, Patient Position: Sitting, Cuff Size: Large)   Pulse (!) 105   Temp (!) 97.1 F (36.2 C) (Temporal)   Ht '5\' 1"'$  (1.549 m)   Wt 247 lb  (112 kg)   SpO2 99%   BMI 46.67 kg/m   Wt Readings from Last 3 Encounters:  12/10/21 239 lb (108.4 kg)  11/18/21 247 lb (112 kg)  06/25/20 240 lb 9.6 oz (109.1 kg)    General: Appears her stated age, obese, in NAD. Cardiovascular: Tachycardic with normal rhythm. S1,S2 noted.  No murmur, rubs or gallops noted.  Pulmonary/Chest: Normal effort and positive vesicular breath sounds. No respiratory distress. No wheezes, rales or ronchi noted.  Musculoskeletal: No difficulty with gait.  Neurological: Alert and oriented.    BMET    Component Value Date/Time   NA 138 11/18/2021 1040   NA 138 08/03/2019 0000   NA 142 12/11/2011 1941   K 4.4 11/18/2021 1040   K 4.1 12/11/2011 1941   CL 105 11/18/2021 1040   CL 104 12/11/2011 1941   CO2 24 11/18/2021 1040   CO2 27 12/11/2011 1941   GLUCOSE 88 11/18/2021 1040   GLUCOSE 83 12/11/2011 1941   BUN 26 (H) 11/18/2021 1040   BUN 19 08/03/2019 0000   BUN 20 (H) 12/11/2011 1941   CREATININE 0.80 11/18/2021 1040   CREATININE 0.81 12/11/2011 1941   CALCIUM 9.8 11/18/2021 1040   CALCIUM 9.1 12/11/2011 1941   GFRNONAA >60 02/18/2017 1109   GFRNONAA >60 12/11/2011 1941   GFRAA >60 02/18/2017 1109   GFRAA >60 12/11/2011 1941    Lipid Panel     Component Value Date/Time   CHOL 205 (A) 08/03/2019 0000   TRIG 91 08/03/2019 0000   HDL 66 08/03/2019 0000   LDLCALC 123 08/03/2019 0000    CBC    Component Value Date/Time   WBC 10.2 11/18/2021 1040   RBC 4.83 11/18/2021 1040   HGB 14.1 11/18/2021 1040   HGB 14.4 12/11/2011 1941   HCT 42.2 11/18/2021 1040   HCT 42.4 12/11/2011 1941   PLT 443.0 (H) 11/18/2021 1040   PLT 371 12/11/2011 1941   MCV 87.3 11/18/2021 1040   MCV 88 12/11/2011 1941   MCH 29.0 02/18/2017 1109   MCHC 33.4 11/18/2021 1040   RDW 13.5 11/18/2021 1040   RDW 13.3 12/11/2011 1941   LYMPHSABS 1.8 11/18/2021 1040   MONOABS 0.8 11/18/2021 1040   EOSABS 0.2 11/18/2021 1040   BASOSABS 0.1 11/18/2021 1040    Hgb  A1C Lab Results  Component Value Date   HGBA1C 5.4 04/15/2018           Assessment & Plan:  RTC in 4 months for your annual exam Webb Silversmith, NP

## 2022-02-21 NOTE — Assessment & Plan Note (Signed)
Encouraged low carb diet and exercise for weight loss Will trial Ozempic

## 2022-02-21 NOTE — Assessment & Plan Note (Signed)
At goal but she needs it to be < 120/80, she will consider increasing Lisinopril to 10 mg daily right before her appt

## 2022-02-21 NOTE — Patient Instructions (Signed)
Calorie Counting for Weight Loss Calories are units of energy. Your body needs a certain number of calories from food to keep going throughout the day. When you eat or drink more calories than your body needs, your body stores the extra calories mostly as fat. When you eat or drink fewer calories than your body needs, your body burns fat to get the energy it needs. Calorie counting means keeping track of how many calories you eat and drink each day. Calorie counting can be helpful if you need to lose weight. If you eat fewer calories than your body needs, you should lose weight. Ask your health care provider what a healthy weight is for you. For calorie counting to work, you will need to eat the right number of calories each day to lose a healthy amount of weight per week. A dietitian can help you figure out how many calories you need in a day and will suggest ways to reach your calorie goal. A healthy amount of weight to lose each week is usually 1-2 lb (0.5-0.9 kg). This usually means that your daily calorie intake should be reduced by 500-750 calories. Eating 1,200-1,500 calories a day can help most women lose weight. Eating 1,500-1,800 calories a day can help most men lose weight. What do I need to know about calorie counting? Work with your health care provider or dietitian to determine how many calories you should get each day. To meet your daily calorie goal, you will need to: Find out how many calories are in each food that you would like to eat. Try to do this before you eat. Decide how much of the food you plan to eat. Keep a food log. Do this by writing down what you ate and how many calories it had. To successfully lose weight, it is important to balance calorie counting with a healthy lifestyle that includes regular activity. Where do I find calorie information?  The number of calories in a food can be found on a Nutrition Facts label. If a food does not have a Nutrition Facts label, try  to look up the calories online or ask your dietitian for help. Remember that calories are listed per serving. If you choose to have more than one serving of a food, you will have to multiply the calories per serving by the number of servings you plan to eat. For example, the label on a package of bread might say that a serving size is 1 slice and that there are 90 calories in a serving. If you eat 1 slice, you will have eaten 90 calories. If you eat 2 slices, you will have eaten 180 calories. How do I keep a food log? After each time that you eat, record the following in your food log as soon as possible: What you ate. Be sure to include toppings, sauces, and other extras on the food. How much you ate. This can be measured in cups, ounces, or number of items. How many calories were in each food and drink. The total number of calories in the food you ate. Keep your food log near you, such as in a pocket-sized notebook or on an app or website on your mobile phone. Some programs will calculate calories for you and show you how many calories you have left to meet your daily goal. What are some portion-control tips? Know how many calories are in a serving. This will help you know how many servings you can have of a certain   food. Use a measuring cup to measure serving sizes. You could also try weighing out portions on a kitchen scale. With time, you will be able to estimate serving sizes for some foods. Take time to put servings of different foods on your favorite plates or in your favorite bowls and cups so you know what a serving looks like. Try not to eat straight from a food's packaging, such as from a bag or box. Eating straight from the package makes it hard to see how much you are eating and can lead to overeating. Put the amount you would like to eat in a cup or on a plate to make sure you are eating the right portion. Use smaller plates, glasses, and bowls for smaller portions and to prevent  overeating. Try not to multitask. For example, avoid watching TV or using your computer while eating. If it is time to eat, sit down at a table and enjoy your food. This will help you recognize when you are full. It will also help you be more mindful of what and how much you are eating. What are tips for following this plan? Reading food labels Check the calorie count compared with the serving size. The serving size may be smaller than what you are used to eating. Check the source of the calories. Try to choose foods that are high in protein, fiber, and vitamins, and low in saturated fat, trans fat, and sodium. Shopping Read nutrition labels while you shop. This will help you make healthy decisions about which foods to buy. Pay attention to nutrition labels for low-fat or fat-free foods. These foods sometimes have the same number of calories or more calories than the full-fat versions. They also often have added sugar, starch, or salt to make up for flavor that was removed with the fat. Make a grocery list of lower-calorie foods and stick to it. Cooking Try to cook your favorite foods in a healthier way. For example, try baking instead of frying. Use low-fat dairy products. Meal planning Use more fruits and vegetables. One-half of your plate should be fruits and vegetables. Include lean proteins, such as chicken, turkey, and fish. Lifestyle Each week, aim to do one of the following: 150 minutes of moderate exercise, such as walking. 75 minutes of vigorous exercise, such as running. General information Know how many calories are in the foods you eat most often. This will help you calculate calorie counts faster. Find a way of tracking calories that works for you. Get creative. Try different apps or programs if writing down calories does not work for you. What foods should I eat?  Eat nutritious foods. It is better to have a nutritious, high-calorie food, such as an avocado, than a food with  few nutrients, such as a bag of potato chips. Use your calories on foods and drinks that will fill you up and will not leave you hungry soon after eating. Examples of foods that fill you up are nuts and nut butters, vegetables, lean proteins, and high-fiber foods such as whole grains. High-fiber foods are foods with more than 5 g of fiber per serving. Pay attention to calories in drinks. Low-calorie drinks include water and unsweetened drinks. The items listed above may not be a complete list of foods and beverages you can eat. Contact a dietitian for more information. What foods should I limit? Limit foods or drinks that are not good sources of vitamins, minerals, or protein or that are high in unhealthy fats. These   include: Candy. Other sweets. Sodas, specialty coffee drinks, alcohol, and juice. The items listed above may not be a complete list of foods and beverages you should avoid. Contact a dietitian for more information. How do I count calories when eating out? Pay attention to portions. Often, portions are much larger when eating out. Try these tips to keep portions smaller: Consider sharing a meal instead of getting your own. If you get your own meal, eat only half of it. Before you start eating, ask for a container and put half of your meal into it. When available, consider ordering smaller portions from the menu instead of full portions. Pay attention to your food and drink choices. Knowing the way food is cooked and what is included with the meal can help you eat fewer calories. If calories are listed on the menu, choose the lower-calorie options. Choose dishes that include vegetables, fruits, whole grains, low-fat dairy products, and lean proteins. Choose items that are boiled, broiled, grilled, or steamed. Avoid items that are buttered, battered, fried, or served with cream sauce. Items labeled as crispy are usually fried, unless stated otherwise. Choose water, low-fat milk,  unsweetened iced tea, or other drinks without added sugar. If you want an alcoholic beverage, choose a lower-calorie option, such as a glass of wine or light beer. Ask for dressings, sauces, and syrups on the side. These are usually high in calories, so you should limit the amount you eat. If you want a salad, choose a garden salad and ask for grilled meats. Avoid extra toppings such as bacon, cheese, or fried items. Ask for the dressing on the side, or ask for olive oil and vinegar or lemon to use as dressing. Estimate how many servings of a food you are given. Knowing serving sizes will help you be aware of how much food you are eating at restaurants. Where to find more information Centers for Disease Control and Prevention: www.cdc.gov [place] Department of Agriculture: myplate.gov Summary Calorie counting means keeping track of how many calories you eat and drink each day. If you eat fewer calories than your body needs, you should lose weight. A healthy amount of weight to lose per week is usually 1-2 lb (0.5-0.9 kg). This usually means reducing your daily calorie intake by 500-750 calories. The number of calories in a food can be found on a Nutrition Facts label. If a food does not have a Nutrition Facts label, try to look up the calories online or ask your dietitian for help. Use smaller plates, glasses, and bowls for smaller portions and to prevent overeating. Use your calories on foods and drinks that will fill you up and not leave you hungry shortly after a meal. This information is not intended to replace advice given to you by your health care provider. Make sure you discuss any questions you have with your health care provider. Document Revised: 10/06/2019 Document Reviewed: 10/06/2019 Elsevier Patient Education  2023 [employer]  

## 2022-02-21 NOTE — Assessment & Plan Note (Signed)
Will trial Ozempic, RX sent to pharmacy Encouraged low carb diet and exercise for weight loss

## 2022-02-25 ENCOUNTER — Encounter: Payer: Self-pay | Admitting: Internal Medicine

## 2022-03-04 ENCOUNTER — Encounter: Payer: Self-pay | Admitting: Internal Medicine

## 2022-05-06 ENCOUNTER — Encounter: Payer: Self-pay | Admitting: Internal Medicine

## 2022-05-06 ENCOUNTER — Other Ambulatory Visit: Payer: Self-pay | Admitting: Internal Medicine

## 2022-08-25 ENCOUNTER — Telehealth: Payer: Managed Care, Other (non HMO) | Admitting: Physician Assistant

## 2022-08-25 DIAGNOSIS — Z8709 Personal history of other diseases of the respiratory system: Secondary | ICD-10-CM

## 2022-08-25 DIAGNOSIS — J019 Acute sinusitis, unspecified: Secondary | ICD-10-CM

## 2022-08-25 DIAGNOSIS — B9689 Other specified bacterial agents as the cause of diseases classified elsewhere: Secondary | ICD-10-CM

## 2022-08-25 DIAGNOSIS — R051 Acute cough: Secondary | ICD-10-CM | POA: Diagnosis not present

## 2022-08-25 HISTORY — DX: Personal history of other diseases of the respiratory system: Z87.09

## 2022-08-25 MED ORDER — AMOXICILLIN-POT CLAVULANATE 875-125 MG PO TABS: 1.0000 | ORAL_TABLET | Freq: Two times a day (BID) | ORAL | 0 refills | Status: DC

## 2022-08-25 MED ORDER — PREDNISONE 20 MG PO TABS: 40.0000 mg | ORAL_TABLET | Freq: Every day | ORAL | 0 refills | Status: DC

## 2022-08-25 MED ORDER — PROMETHAZINE-DM 6.25-15 MG/5ML PO SYRP: 5.0000 mL | ORAL_SOLUTION | Freq: Four times a day (QID) | ORAL | 0 refills | Status: DC | PRN

## 2022-08-25 NOTE — Progress Notes (Signed)
Virtual Visit Consent   Terri Gamble, you are scheduled for a virtual visit with a [facility] provider today. Just as with appointments in the office, your consent must be obtained to participate. Your consent will be active for this visit and any virtual visit you may have with one of our providers in the next 365 days. If you have a MyChart account, a copy of this consent can be sent to you electronically.  As this is a virtual visit, video technology does not allow for your provider to perform a traditional examination. This may limit your provider's ability to fully assess your condition. If your provider identifies any concerns that need to be evaluated in person or the need to arrange testing (such as labs, EKG, etc.), we will make arrangements to do so. Although advances in technology are sophisticated, we cannot ensure that it will always work on either your end or our end. If the connection with a video visit is poor, the visit may have to be switched to a telephone visit. With either a video or telephone visit, we are not always able to ensure that we have a secure connection.  By engaging in this virtual visit, you consent to the provision of healthcare and authorize for your insurance to be billed (if applicable) for the services provided during this visit. Depending on your insurance coverage, you may receive a charge related to this service.  I need to obtain your verbal consent now. Are you willing to proceed with your visit today? Terri Gamble has provided verbal consent on 08/25/2022 for a virtual visit (video or telephone). Mar Daring, PA-C  Date: 08/25/2022 9:36 AM  Virtual Visit via Video Note   I, Mar Daring, connected with  Terri Gamble  ([id number], 1961/06/17) on 08/25/22 at  9:30 AM EST by a video-enabled telemedicine application and verified that I am speaking with the correct person using two identifiers.  Location: Patient:  Virtual Visit Location Patient: Home Provider: Virtual Visit Location Provider: Home Office   I discussed the limitations of evaluation and management by telemedicine and the availability of in person appointments. The patient expressed understanding and agreed to proceed.    History of Present Illness: Terri Gamble is a 61 y.o. who identifies as a female who was assigned female at birth, and is being seen today for URI symptoms.  HPI: URI  This is a new problem. The current episode started 1 to 4 weeks ago (9 days). The problem has been gradually worsening. There has been no fever. Associated symptoms include congestion, coughing (deep, "croupy" cough), ear pain, headaches, nausea, a plugged ear sensation, rhinorrhea, sinus pain, a sore throat and vomiting (initially). Pertinent negatives include no diarrhea. Associated symptoms comments: Chills, fatigue, mild body aches. She has tried NSAIDs (Coricidin HBP, tessalon perles) for the symptoms. The treatment provided no relief.      Problems:  Patient Active Problem List   Diagnosis Date Noted   Thrombocytosis 02/21/2022   Prediabetes 02/21/2022   Morbid obesity ([place]) 12/10/2021   Constipation 11/18/2021   Essential hypertension 06/09/2017    Allergies:  Allergies  Allergen Reactions   Codeine Nausea Only   Morphine And Related Other (See Comments)    Headache   Latex Rash   Medications:  Current Outpatient Medications:    amoxicillin-clavulanate (AUGMENTIN) 875-125 MG tablet, Take 1 tablet by mouth 2 (two) times daily., Disp: 14 tablet, Rfl: 0   predniSONE (DELTASONE) 20 MG tablet,  Take 2 tablets (40 mg total) by mouth daily with breakfast., Disp: 10 tablet, Rfl: 0   promethazine-dextromethorphan (PROMETHAZINE-DM) 6.25-15 MG/5ML syrup, Take 5 mLs by mouth 4 (four) times daily as needed., Disp: 118 mL, Rfl: 0   Biotin 3 MG TABS, Take by mouth., Disp: , Rfl:    cholecalciferol (VITAMIN D3) 25 MCG (1000 UNIT) tablet, Take  1,000 Units by mouth daily., Disp: , Rfl:    ibuprofen (ADVIL,MOTRIN) 200 MG tablet, Take 400 mg by mouth every 6 (six) hours as needed., Disp: , Rfl:    Insulin Pen Needle 31G X 5 MM MISC, BD Pen Needles- brand specific Inject insulin via insulin pen 6 x daily, Disp: 12 each, Rfl: 0   lisinopril (ZESTRIL) 10 MG tablet, Take 0.5 tablets (5 mg total) by mouth daily., Disp: 90 tablet, Rfl: 3   ondansetron (ZOFRAN-ODT) 4 MG disintegrating tablet, Take 1 tablet (4 mg total) by mouth every 8 (eight) hours as needed for nausea or vomiting., Disp: 20 tablet, Rfl: 0   OZEMPIC, 0.25 OR 0.5 MG/DOSE, 2 MG/3ML SOPN, Inject 0.5 mg into the skin once a week. Start with 0.'25mg'$  weekly x 4 weeks then increase to 0.'5mg'$  weekly injection., Disp: 9 mL, Rfl: 0   valACYclovir (VALTREX) 500 MG tablet, Take 500 mg by mouth 2 (two) times daily as needed (for fever blisters)., Disp: , Rfl:   Observations/Objective: Patient is well-developed, well-nourished in no acute distress.  Resting comfortably at home.  Head is normocephalic, atraumatic.  No labored breathing.  Speech is clear and coherent with logical content.  Patient is alert and oriented at baseline.    Assessment and Plan: 1. Acute bacterial sinusitis - amoxicillin-clavulanate (AUGMENTIN) 875-125 MG tablet; Take 1 tablet by mouth 2 (two) times daily.  Dispense: 14 tablet; Refill: 0 - predniSONE (DELTASONE) 20 MG tablet; Take 2 tablets (40 mg total) by mouth daily with breakfast.  Dispense: 10 tablet; Refill: 0  2. Acute cough - predniSONE (DELTASONE) 20 MG tablet; Take 2 tablets (40 mg total) by mouth daily with breakfast.  Dispense: 10 tablet; Refill: 0 - promethazine-dextromethorphan (PROMETHAZINE-DM) 6.25-15 MG/5ML syrup; Take 5 mLs by mouth 4 (four) times daily as needed.  Dispense: 118 mL; Refill: 0  - Worsening symptoms that have not responded to OTC medications.  - Will give Augmentin - Promethazine DM for nighttime cough - Prednisone for  inflammation - Steam and humidifier can help - Stay well hydrated and get plenty of rest.  - Seek in person evaluation if no symptom improvement or if symptoms worsen   Follow Up Instructions: I discussed the assessment and treatment plan with the patient. The patient was provided an opportunity to ask questions and all were answered. The patient agreed with the plan and demonstrated an understanding of the instructions.  A copy of instructions were sent to the patient via MyChart unless otherwise noted below.    The patient was advised to call back or seek an in-person evaluation if the symptoms worsen or if the condition fails to improve as anticipated.  Time:  I spent 8 minutes with the patient via telehealth technology discussing the above problems/concerns.    Mar Daring, PA-C

## 2022-08-25 NOTE — Patient Instructions (Signed)
Terri Gamble, thank you for joining Mar Daring, PA-C for today's virtual visit.  While this provider is not your primary care provider (PCP), if your PCP is located in our provider database this encounter information will be shared with them immediately following your visit.   [facility] account gives you access to today's visit and all your visits, tests, and labs performed at [facility] " click here if you don't have a [facility] account or go to [email or web address]  Consent: (Patient) Terri Gamble provided verbal consent for this virtual visit at the beginning of the encounter.  Current Medications:  Current Outpatient Medications:    amoxicillin-clavulanate (AUGMENTIN) 875-125 MG tablet, Take 1 tablet by mouth 2 (two) times daily., Disp: 14 tablet, Rfl: 0   predniSONE (DELTASONE) 20 MG tablet, Take 2 tablets (40 mg total) by mouth daily with breakfast., Disp: 10 tablet, Rfl: 0   promethazine-dextromethorphan (PROMETHAZINE-DM) 6.25-15 MG/5ML syrup, Take 5 mLs by mouth 4 (four) times daily as needed., Disp: 118 mL, Rfl: 0   Biotin 3 MG TABS, Take by mouth., Disp: , Rfl:    cholecalciferol (VITAMIN D3) 25 MCG (1000 UNIT) tablet, Take 1,000 Units by mouth daily., Disp: , Rfl:    ibuprofen (ADVIL,MOTRIN) 200 MG tablet, Take 400 mg by mouth every 6 (six) hours as needed., Disp: , Rfl:    Insulin Pen Needle 31G X 5 MM MISC, BD Pen Needles- brand specific Inject insulin via insulin pen 6 x daily, Disp: 12 each, Rfl: 0   lisinopril (ZESTRIL) 10 MG tablet, Take 0.5 tablets (5 mg total) by mouth daily., Disp: 90 tablet, Rfl: 3   ondansetron (ZOFRAN-ODT) 4 MG disintegrating tablet, Take 1 tablet (4 mg total) by mouth every 8 (eight) hours as needed for nausea or vomiting., Disp: 20 tablet, Rfl: 0   OZEMPIC, 0.25 OR 0.5 MG/DOSE, 2 MG/3ML SOPN, Inject 0.5 mg into the skin once a week. Start with 0.'25mg'$  weekly x 4 weeks then increase to 0.'5mg'$   weekly injection., Disp: 9 mL, Rfl: 0   valACYclovir (VALTREX) 500 MG tablet, Take 500 mg by mouth 2 (two) times daily as needed (for fever blisters)., Disp: , Rfl:    Medications ordered in this encounter:  Meds ordered this encounter  Medications   amoxicillin-clavulanate (AUGMENTIN) 875-125 MG tablet    Sig: Take 1 tablet by mouth 2 (two) times daily.    Dispense:  14 tablet    Refill:  0    Order Specific Question:   Supervising Provider    Answer:   Chase Picket [[id number]]   predniSONE (DELTASONE) 20 MG tablet    Sig: Take 2 tablets (40 mg total) by mouth daily with breakfast.    Dispense:  10 tablet    Refill:  0    Order Specific Question:   Supervising Provider    Answer:   Chase Picket [[id number]]   promethazine-dextromethorphan (PROMETHAZINE-DM) 6.25-15 MG/5ML syrup    Sig: Take 5 mLs by mouth 4 (four) times daily as needed.    Dispense:  118 mL    Refill:  0    Order Specific Question:   Supervising Provider    Answer:   Chase Picket [[id number]]     *If you need refills on other medications prior to your next appointment, please contact your pharmacy*  Follow-Up: Call back or seek an in-person evaluation if the symptoms worsen or if the condition fails to improve as anticipated.  [facility]  [place] [phone]  Other Instructions  Sinus Infection, Adult A sinus infection, also called sinusitis, is inflammation of your sinuses. Sinuses are hollow spaces in the bones around your face. Your sinuses are located: Around your eyes. In the middle of your forehead. Behind your nose. In your cheekbones. Mucus normally drains out of your sinuses. When your nasal tissues become inflamed or swollen, mucus can become trapped or blocked. This allows bacteria, viruses, and fungi to grow, which leads to infection. Most infections of the sinuses are caused by a virus. A sinus infection can develop quickly. It can last for up to 4 weeks (acute) or for more than  12 weeks (chronic). A sinus infection often develops after a cold. What are the causes? This condition is caused by anything that creates swelling in the sinuses or stops mucus from draining. This includes: Allergies. Asthma. Infection from bacteria or viruses. Deformities or blockages in your nose or sinuses. Abnormal growths in the nose (nasal polyps). Pollutants, such as chemicals or irritants in the air. Infection from fungi. This is rare. What increases the risk? You are more likely to develop this condition if you: Have a weak body defense system (immune system). Do a lot of swimming or diving. Overuse nasal sprays. Smoke. What are the signs or symptoms? The main symptoms of this condition are pain and a feeling of pressure around the affected sinuses. Other symptoms include: Stuffy nose or congestion that makes it difficult to breathe through your nose. Thick yellow or greenish drainage from your nose. Tenderness, swelling, and warmth over the affected sinuses. A cough that may get worse at night. Decreased sense of smell and taste. Extra mucus that collects in the throat or the back of the nose (postnasal drip) causing a sore throat or bad breath. Tiredness (fatigue). Fever. How is this diagnosed? This condition is diagnosed based on: Your symptoms. Your medical history. A physical exam. Tests to find out if your condition is acute or chronic. This may include: Checking your nose for nasal polyps. Viewing your sinuses using a device that has a light (endoscope). Testing for allergies or bacteria. Imaging tests, such as an MRI or CT scan. In rare cases, a bone biopsy may be done to rule out more serious types of fungal sinus disease. How is this treated? Treatment for a sinus infection depends on the cause and whether your condition is chronic or acute. If caused by a virus, your symptoms should go away on their own within 10 days. You may be given medicines to relieve  symptoms. They include: Medicines that shrink swollen nasal passages (decongestants). A spray that eases inflammation of the nostrils (topical intranasal corticosteroids). Rinses that help get rid of thick mucus in your nose (nasal saline washes). Medicines that treat allergies (antihistamines). Over-the-counter pain relievers. If caused by bacteria, your health care provider may recommend waiting to see if your symptoms improve. Most bacterial infections will get better without antibiotic medicine. You may be given antibiotics if you have: A severe infection. A weak immune system. If caused by narrow nasal passages or nasal polyps, surgery may be needed. Follow these instructions at home: Medicines Take, use, or apply over-the-counter and prescription medicines only as told by your health care provider. These may include nasal sprays. If you were prescribed an antibiotic medicine, take it as told by your health care provider. Do not stop taking the antibiotic even if you start to feel better. Hydrate and humidify  Drink enough fluid  to keep your urine pale yellow. Staying hydrated will help to thin your mucus. Use a cool mist humidifier to keep the humidity level in your home above 50%. Inhale steam for 10-15 minutes, 3-4 times a day, or as told by your health care provider. You can do this in the bathroom while a hot shower is running. Limit your exposure to cool or dry air. Rest Rest as much as possible. Sleep with your head raised (elevated). Make sure you get enough sleep each night. General instructions  Apply a warm, moist washcloth to your face 3-4 times a day or as told by your health care provider. This will help with discomfort. Use nasal saline washes as often as told by your health care provider. Wash your hands often with soap and water to reduce your exposure to germs. If soap and water are not available, use hand sanitizer. Do not smoke. Avoid being around people who are  smoking (secondhand smoke). Keep all follow-up visits. This is important. Contact a health care provider if: You have a fever. Your symptoms get worse. Your symptoms do not improve within 10 days. Get help right away if: You have a severe headache. You have persistent vomiting. You have severe pain or swelling around your face or eyes. You have vision problems. You develop confusion. Your neck is stiff. You have trouble breathing. These symptoms may be an emergency. Get help right away. Call 911. Do not wait to see if the symptoms will go away. Do not drive yourself to the hospital. Summary A sinus infection is soreness and inflammation of your sinuses. Sinuses are hollow spaces in the bones around your face. This condition is caused by nasal tissues that become inflamed or swollen. The swelling traps or blocks the flow of mucus. This allows bacteria, viruses, and fungi to grow, which leads to infection. If you were prescribed an antibiotic medicine, take it as told by your health care provider. Do not stop taking the antibiotic even if you start to feel better. Keep all follow-up visits. This is important. This information is not intended to replace advice given to you by your health care provider. Make sure you discuss any questions you have with your health care provider. Document Revised: 07/30/2021 Document Reviewed: 07/30/2021 Elsevier Patient Education  [employer].    If you have been instructed to have an in-person evaluation today at a local Urgent Care facility, please use the link below. It will take you to a list of all of our available [facility], including address, phone number and hours of operation. Please do not delay care.  [facility]  If you or a family member do not have a primary care provider, use the link below to schedule a visit and establish care. When you choose a [facility] primary care physician or advanced practice  provider, you gain a long-term partner in health. Find a Primary Care Provider  Learn more about [facility]'s in-office and virtual care options: [facility]

## 2022-09-02 NOTE — H&P (Signed)
Terri Gamble is an 61 y.o. female G1P1 presenting for scheduled hysteroscopy/polypectomy/ and placement of Mirena IUD.  Pt reported an episode of light VB in 5/23 so had full workup.  She had a  h/o polyps removed in 2018 that were benign at the time of surgery she had some stenosis and cavity noted to deviate to left.  SIUS demonstrated a sizeable recurrent polyp over 2cm and EMB was benign.   Pertinent Gynecological History:  OB History:  C/S x 1   Menstrual History:  No LMP recorded. Patient is postmenopausal.    Past Medical History:  Diagnosis Date   Hypertension    Seasonal allergies     Past Surgical History:  Procedure Laterality Date   ABLATION  2006   [place]  1977, 1984   COLONOSCOPY  2016   DILATATION & CURETTAGE/HYSTEROSCOPY WITH [place] N/A 07/03/2017   Procedure: DILATATION & CURETTAGE/HYSTEROSCOPY WITH MYOSURE;  Surgeon: Paula Compton, MD;  Location: [facility];  Service: Gynecology;  Laterality: N/A;  MD request 30 extra minutes    Family History  Problem Relation Age of Onset   Heart disease Mother    Kidney disease Mother    Hypertension Mother    Diabetes Mother    Diabetes Maternal Grandmother    Hypertension Maternal Grandmother    Heart disease Maternal Grandmother    Stroke Maternal Grandmother    Heart disease Maternal Grandfather    Cardiomyopathy Maternal Grandfather    Heart disease Paternal Grandmother    Stroke Paternal Grandmother    Hypertension Paternal Grandmother    Alzheimer's disease Paternal Grandfather    Lung cancer Maternal Uncle    Stomach cancer Paternal Uncle        x 2   Lung cancer Paternal Uncle    Pancreatic cancer Maternal Aunt    Colon cancer Neg Hx    Esophageal cancer Neg Hx    Liver disease Neg Hx    Ovarian cancer Neg Hx    Breast cancer Neg Hx     Social History:  reports that she quit smoking about 17 years ago. Her smoking use included cigarettes. She has a 7.50 pack-year smoking history.  She has never used smokeless tobacco. She reports current alcohol use. She reports that she does not use drugs.  Allergies:  Allergies  Allergen Reactions   Codeine Nausea Only   Morphine And Related Other (See Comments)    Headache   Latex Rash    No medications prior to admission.    Review of Systems  Constitutional:  Negative for fever.  Genitourinary:  Positive for vaginal bleeding. Negative for dyspareunia and dysuria.    There were no vitals taken for this visit. Physical Exam Cardiovascular:     Rate and Rhythm: Normal rate and regular rhythm.  Pulmonary:     Effort: Pulmonary effort is normal.  Abdominal:     Palpations: Abdomen is soft.  Genitourinary:    General: Normal vulva.     Rectum: Normal.  Neurological:     General: No focal deficit present.     Mental Status: She is alert.  Psychiatric:        Mood and Affect: Mood normal.     No results found for this or any previous visit (from the past 24 hour(s)).  No results found.  Assessment/Plan: The patient was counseled regarding the hysteroscopy procedure in detail.  We reviewed risks of bleeding and infection and possible uterine perforation.  We also discussed  the removal of any identified polyps or submucosal fibroids and what that would involve.  We did discuss placing a Mirena at time of surgery for future endometrial protection and hopefully to prevent future polyps. Pt is agreeable to this and will add to consent. The patient desires to proceed.  She will use cytotec 439mg 3 hours prior to the procedure to aid with cervical dilation.   KLogan Bores12/26/2023, 9:20 AM

## 2022-09-03 ENCOUNTER — Encounter (HOSPITAL_BASED_OUTPATIENT_CLINIC_OR_DEPARTMENT_OTHER): Payer: Self-pay | Admitting: Obstetrics and Gynecology

## 2022-09-03 NOTE — Progress Notes (Signed)
Spoke w/ via phone for pre-op interview--- pt Lab needs dos----  cbc, istat, ekg             Lab results------ no COVID test -----patient states asymptomatic no test needed Arrive at ------- 0700 on 09-05-2022 NPO after MN NO Solid Food.  Clear liquids from MN until--- 0600 Med rec completed Medications to take morning of surgery ----- none Diabetic medication ----- n/a Patient instructed no nail polish to be worn day of surgery Patient instructed to bring photo id and insurance card day of surgery Patient aware to have Driver (ride ) / caregiver    for 24 hours after surgery -- son, Terri Gamble Patient Special Instructions ----- n/a Pre-Op special Istructions ----- n/a Patient verbalized understanding of instructions that were given at this phone interview. Patient denies shortness of breath, chest pain, fever, cough at this phone interview.

## 2022-09-05 ENCOUNTER — Ambulatory Visit (HOSPITAL_BASED_OUTPATIENT_CLINIC_OR_DEPARTMENT_OTHER)
Admission: RE | Admit: 2022-09-05 | Discharge: 2022-09-05 | Disposition: A | Discharge status: Home / Self Care | Payer: Managed Care, Other (non HMO) | Attending: Obstetrics and Gynecology | Admitting: Obstetrics and Gynecology

## 2022-09-05 ENCOUNTER — Other Ambulatory Visit: Payer: Self-pay

## 2022-09-05 ENCOUNTER — Ambulatory Visit (HOSPITAL_BASED_OUTPATIENT_CLINIC_OR_DEPARTMENT_OTHER): Payer: Managed Care, Other (non HMO) | Admitting: Certified Registered Nurse Anesthetist

## 2022-09-05 ENCOUNTER — Encounter (HOSPITAL_BASED_OUTPATIENT_CLINIC_OR_DEPARTMENT_OTHER): Payer: Self-pay | Admitting: Obstetrics and Gynecology

## 2022-09-05 ENCOUNTER — Encounter (HOSPITAL_BASED_OUTPATIENT_CLINIC_OR_DEPARTMENT_OTHER)
Admission: RE | Disposition: A | Discharge status: Home / Self Care | Payer: Self-pay | Source: Home / Self Care | Attending: Obstetrics and Gynecology

## 2022-09-05 DIAGNOSIS — N840 Polyp of corpus uteri: Secondary | ICD-10-CM

## 2022-09-05 DIAGNOSIS — N950 Postmenopausal bleeding: Secondary | ICD-10-CM | POA: Diagnosis present

## 2022-09-05 DIAGNOSIS — Z6841 Body Mass Index (BMI) 40.0 and over, adult: Secondary | ICD-10-CM | POA: Diagnosis not present

## 2022-09-05 DIAGNOSIS — Z30430 Encounter for insertion of intrauterine contraceptive device: Secondary | ICD-10-CM | POA: Insufficient documentation

## 2022-09-05 DIAGNOSIS — Z01818 Encounter for other preprocedural examination: Secondary | ICD-10-CM

## 2022-09-05 DIAGNOSIS — I10 Essential (primary) hypertension: Secondary | ICD-10-CM

## 2022-09-05 DIAGNOSIS — C541 Malignant neoplasm of endometrium: Secondary | ICD-10-CM | POA: Diagnosis not present

## 2022-09-05 DIAGNOSIS — Z87891 Personal history of nicotine dependence: Secondary | ICD-10-CM | POA: Diagnosis not present

## 2022-09-05 HISTORY — PX: INTRAUTERINE DEVICE (IUD) INSERTION: SHX5877

## 2022-09-05 HISTORY — PX: POLYPECTOMY: SHX149

## 2022-09-05 HISTORY — DX: Leiomyoma of uterus, unspecified: D25.9

## 2022-09-05 HISTORY — DX: Polyp of corpus uteri: N84.0

## 2022-09-05 HISTORY — PX: DILATATION & CURETTAGE/HYSTEROSCOPY WITH MYOSURE: SHX6511

## 2022-09-05 HISTORY — DX: Other complications of anesthesia, initial encounter: T88.59XA

## 2022-09-05 HISTORY — DX: Diverticulosis of large intestine without perforation or abscess without bleeding: K57.30

## 2022-09-05 HISTORY — DX: Excessive and frequent menstruation with regular cycle: N92.0

## 2022-09-05 HISTORY — DX: Thrombocytosis, unspecified: D75.839

## 2022-09-05 LAB — CBC
HCT: 41.5 % (ref 36.0–46.0)
Hemoglobin: 13.6 g/dL (ref 12.0–15.0)
MCH: 29.2 pg (ref 26.0–34.0)
MCHC: 32.8 g/dL (ref 30.0–36.0)
MCV: 89.2 fL (ref 80.0–100.0)
Platelets: 390 10*3/uL (ref 150–400)
RBC: 4.65 MIL/uL (ref 3.87–5.11)
RDW: 13.4 % (ref 11.5–15.5)
WBC: 12 10*3/uL — ABNORMAL HIGH (ref 4.0–10.5)
nRBC: 0 % (ref 0.0–0.2)

## 2022-09-05 LAB — POCT I-STAT, CHEM 8
BUN: 28 mg/dL — ABNORMAL HIGH (ref 8–23)
Calcium, Ion: 1.25 mmol/L (ref 1.15–1.40)
Chloride: 104 mmol/L (ref 98–111)
Creatinine, Ser: 0.7 mg/dL (ref 0.44–1.00)
Glucose, Bld: 95 mg/dL (ref 70–99)
HCT: 42 % (ref 36.0–46.0)
Hemoglobin: 14.3 g/dL (ref 12.0–15.0)
Potassium: 4.6 mmol/L (ref 3.5–5.1)
Sodium: 140 mmol/L (ref 135–145)
TCO2: 26 mmol/L (ref 22–32)

## 2022-09-05 SURGERY — DILATATION & CURETTAGE/HYSTEROSCOPY WITH MYOSURE
Anesthesia: General | Site: Uterus

## 2022-09-05 MED ORDER — MIDAZOLAM HCL 2 MG/2ML IJ SOLN
INTRAMUSCULAR | Status: AC
  Filled 2022-09-05: qty 2

## 2022-09-05 MED ORDER — DEXAMETHASONE SODIUM PHOSPHATE 10 MG/ML IJ SOLN
INTRAMUSCULAR | Status: DC | PRN
  Administered 2022-09-05: 10 mg via INTRAVENOUS

## 2022-09-05 MED ORDER — AMISULPRIDE (ANTIEMETIC) 5 MG/2ML IV SOLN: 10.0000 mg | Freq: Once | INTRAVENOUS | Status: DC | PRN

## 2022-09-05 MED ORDER — ONDANSETRON HCL 4 MG/2ML IJ SOLN
INTRAMUSCULAR | Status: DC | PRN
  Administered 2022-09-05: 4 mg via INTRAVENOUS

## 2022-09-05 MED ORDER — KETOROLAC TROMETHAMINE 30 MG/ML IJ SOLN
INTRAMUSCULAR | Status: DC | PRN
  Administered 2022-09-05: 30 mg via INTRAVENOUS

## 2022-09-05 MED ORDER — LEVONORGESTREL 20 MCG/DAY IU IUD
1.0000 | INTRAUTERINE_SYSTEM | INTRAUTERINE | Status: AC
  Administered 2022-09-05: 1 via INTRAUTERINE

## 2022-09-05 MED ORDER — LACTATED RINGERS IV SOLN: INTRAVENOUS | Status: DC

## 2022-09-05 MED ORDER — MIDAZOLAM HCL 2 MG/2ML IJ SOLN
INTRAMUSCULAR | Status: DC | PRN
  Administered 2022-09-05: 2 mg via INTRAVENOUS

## 2022-09-05 MED ORDER — MEPERIDINE HCL 25 MG/ML IJ SOLN: 6.2500 mg | INTRAMUSCULAR | Status: DC | PRN

## 2022-09-05 MED ORDER — EPHEDRINE SULFATE-NACL 50-0.9 MG/10ML-% IV SOSY
PREFILLED_SYRINGE | INTRAVENOUS | Status: DC | PRN
  Administered 2022-09-05: 5 mg via INTRAVENOUS

## 2022-09-05 MED ORDER — PHENYLEPHRINE 80 MCG/ML (10ML) SYRINGE FOR IV PUSH (FOR BLOOD PRESSURE SUPPORT)
PREFILLED_SYRINGE | INTRAVENOUS | Status: DC | PRN
  Administered 2022-09-05: 160 ug via INTRAVENOUS
  Administered 2022-09-05: 80 ug via INTRAVENOUS
  Administered 2022-09-05: 160 ug via INTRAVENOUS
  Administered 2022-09-05: 80 ug via INTRAVENOUS

## 2022-09-05 MED ORDER — SODIUM CHLORIDE 0.9 % IR SOLN
Status: DC | PRN
  Administered 2022-09-05: 3000 mL

## 2022-09-05 MED ORDER — LIDOCAINE HCL 1 % IJ SOLN
INTRAMUSCULAR | Status: DC | PRN
  Administered 2022-09-05: 20 mL

## 2022-09-05 MED ORDER — PROMETHAZINE HCL 25 MG/ML IJ SOLN: 6.2500 mg | INTRAMUSCULAR | Status: DC | PRN

## 2022-09-05 MED ORDER — FENTANYL CITRATE (PF) 100 MCG/2ML IJ SOLN
INTRAMUSCULAR | Status: AC
  Filled 2022-09-05: qty 2

## 2022-09-05 MED ORDER — POVIDONE-IODINE 10 % EX SWAB: 2.0000 | Freq: Once | CUTANEOUS | Status: DC

## 2022-09-05 MED ORDER — HYDROMORPHONE HCL 1 MG/ML IJ SOLN: 0.2500 mg | INTRAMUSCULAR | Status: DC | PRN

## 2022-09-05 MED ORDER — PROPOFOL 10 MG/ML IV BOLUS
INTRAVENOUS | Status: DC | PRN
  Administered 2022-09-05: 200 mg via INTRAVENOUS

## 2022-09-05 MED ORDER — FENTANYL CITRATE (PF) 250 MCG/5ML IJ SOLN
INTRAMUSCULAR | Status: DC | PRN
  Administered 2022-09-05: 50 ug via INTRAVENOUS
  Administered 2022-09-05 (×2): 25 ug via INTRAVENOUS

## 2022-09-05 MED ORDER — OXYCODONE HCL 5 MG PO TABS: 5.0000 mg | ORAL_TABLET | Freq: Once | ORAL | Status: DC | PRN

## 2022-09-05 MED ORDER — PROPOFOL 10 MG/ML IV BOLUS
INTRAVENOUS | Status: AC
  Filled 2022-09-05: qty 20

## 2022-09-05 MED ORDER — OXYCODONE HCL 5 MG/5ML PO SOLN: 5.0000 mg | Freq: Once | ORAL | Status: DC | PRN

## 2022-09-05 MED ORDER — LIDOCAINE 2% (20 MG/ML) 5 ML SYRINGE
INTRAMUSCULAR | Status: DC | PRN
  Administered 2022-09-05: 50 mg via INTRAVENOUS

## 2022-09-05 SURGICAL SUPPLY — 21 items
CATH ROBINSON RED A/P 16FR (CATHETERS) ×3 IMPLANT
DEVICE MYOSURE LITE (MISCELLANEOUS) IMPLANT
DEVICE MYOSURE REACH (MISCELLANEOUS) ×3 IMPLANT
DRSG TELFA 3X8 NADH STRL (GAUZE/BANDAGES/DRESSINGS) ×3 IMPLANT
GAUZE 4X4 16PLY ~~LOC~~+RFID DBL (SPONGE) ×6 IMPLANT
GLOVE BIO SURGEON STRL SZ 6.5 (GLOVE) ×3 IMPLANT
GLOVE BIO SURGEON STRL SZ7 (GLOVE) ×3 IMPLANT
GLOVE SURG SS PI 6.5 STRL IVOR (GLOVE) IMPLANT
GOWN STRL REUS W/TWL LRG LVL3 (GOWN DISPOSABLE) ×3 IMPLANT
IV NS IRRIG 3000ML ARTHROMATIC (IV SOLUTION) ×3 IMPLANT
KIT PROCEDURE FLUENT (KITS) ×3 IMPLANT
KIT TURNOVER CYSTO (KITS) ×3 IMPLANT
MIRENA IUD IMPLANT
MYOSURE XL FIBROID (MISCELLANEOUS)
NDL SPNL 22GX3.5 QUINCKE BK (NEEDLE) ×3 IMPLANT
NEEDLE SPNL 22GX3.5 QUINCKE BK (NEEDLE) ×2 IMPLANT
PACK VAGINAL MINOR WOMEN LF (CUSTOM PROCEDURE TRAY) ×3 IMPLANT
PAD OB MATERNITY 4.3X12.25 (PERSONAL CARE ITEMS) ×3 IMPLANT
SEAL CERVICAL OMNI LOK (ABLATOR) IMPLANT
SEAL ROD LENS SCOPE MYOSURE (ABLATOR) ×3 IMPLANT
SYSTEM TISS REMOVAL MYOSURE XL (MISCELLANEOUS) IMPLANT

## 2022-09-05 NOTE — Op Note (Signed)
Operative Note    Preoperative Diagnosis Postmenopausal bleeding Endometrial polyps on ultrasound  Postoperative Diagnosis same  Procedure Hysteroscopic polypectomies and endometrial sampling with Myosure Insertion of Mirena IUD  Surgeon Paula Compton, MD  Anesthesia LMA  Fluids: EBL 37m UOP Voided prior to procedure IVF 8045mLR Hysteroscopic deficit 23054mFindings Cervix deviated to left, cavity with multiple endometrial polyps noted and some proliferative appearing lining.  Bilateral tubal ostia seen and WNL.   Specimen Endometrial polyps and curetting  Procedure Note Patient was taken to the operating room where LMA anesthesia was obtained without difficulty. She was then prepped and draped in the normal sterile fashion in the dorsal lithotomy position. An appropriate time out was performed. A speculum was then placed within the vagina and the anterior lip of the cervix identified and injected with approximately 2 cc of 1% plain lidocaine. An additional 9 cc each was placed at 2 and 10:00 for a paracervical block. Uterus was then sounded to approximately 8 cm and the Pratt dilators utilized to dilate the cervix up to approximately 21. The Myosure operating scope was then introduced into the cervix and the cavity inspected with findings as previously noted. The Myosure lite operating blade was then introduced through the scope and under direct visualization multiple polyps were removed in entirety. The lining was also extensively sampled. There was no active bleeding at the conclusion of the removal. The operating scope was then removed from the cervix. All tissue specimens were handed off to pathology. A Mirena IUD was then placed without difficulty and the strings trimmed to 1-2cm.  The tenaculum was then removed from the cervix and the site rendered hemostatic with silver nitrate. Finally the speculum was removed from the vagina and the patient awakened and taken to the  recovery room in good condition.

## 2022-09-05 NOTE — Anesthesia Preprocedure Evaluation (Signed)
Anesthesia Evaluation  Patient identified by MRN, date of birth, ID band Patient awake    Reviewed: Allergy & Precautions, NPO status , Patient's Chart, lab work & pertinent test results  History of Anesthesia Complications Negative for: history of anesthetic complications  Airway Mallampati: II  TM Distance: >3 FB Neck ROM: Full    Dental  (+) Teeth Intact   Pulmonary neg shortness of breath, neg sleep apnea, neg COPD, neg recent URI, former smoker   breath sounds clear to auscultation       Cardiovascular hypertension, Pt. on medications (-) angina (-) Past MI and (-) CHF  Rhythm:Regular     Neuro/Psych negative neurological ROS  negative psych ROS   GI/Hepatic negative GI ROS, Neg liver ROS,,,  Endo/Other    Morbid obesity  Renal/GU negative Renal ROS     Musculoskeletal negative musculoskeletal ROS (+)    Abdominal  (+) + obese  Peds  Hematology negative hematology ROS (+)   Anesthesia Other Findings   Reproductive/Obstetrics                             Anesthesia Physical Anesthesia Plan  ASA: 3  Anesthesia Plan: General   Post-op Pain Management: Minimal or no pain anticipated   Induction: Intravenous  PONV Risk Score and Plan: 3 and Ondansetron, Dexamethasone, Treatment may vary due to age or medical condition and Midazolam  Airway Management Planned: LMA  Additional Equipment: None  Intra-op Plan:   Post-operative Plan: Extubation in OR  Informed Consent: I have reviewed the patients History and Physical, chart, labs and discussed the procedure including the risks, benefits and alternatives for the proposed anesthesia with the patient or authorized representative who has indicated his/her understanding and acceptance.     Dental advisory given  Plan Discussed with: CRNA and Surgeon  Anesthesia Plan Comments:         Anesthesia Quick Evaluation

## 2022-09-05 NOTE — Anesthesia Procedure Notes (Signed)
Procedure Name: LMA Insertion Date/Time: 09/05/2022 8:37 AM  Performed by: Clearnce Sorrel, CRNAPre-anesthesia Checklist: Patient identified, Emergency Drugs available, Suction available and Patient being monitored Patient Re-evaluated:Patient Re-evaluated prior to induction Oxygen Delivery Method: Circle System Utilized Preoxygenation: Pre-oxygenation with 100% oxygen Induction Type: IV induction Ventilation: Mask ventilation without difficulty LMA: LMA inserted LMA Size: 4.0 Number of attempts: 1 Airway Equipment and Method: Bite block Placement Confirmation: positive ETCO2 Tube secured with: Tape Dental Injury: Teeth and Oropharynx as per pre-operative assessment

## 2022-09-05 NOTE — Interval H&P Note (Signed)
History and Physical Interval Note:  09/05/2022 8:17 AM  Terri Gamble  has presented today for surgery, with the diagnosis of polyp of corpus uteri.  The various methods of treatment have been discussed with the patient and family. After consideration of risks, benefits and other options for treatment, the patient has consented to  Procedure(s): [place] (N/A) INTRAUTERINE DEVICE (IUD) INSERTION (N/A) as a surgical intervention.  The patient's history has been reviewed, patient examined, no change in status, stable for surgery.  I have reviewed the patient's chart and labs.  Questions were answered to the patient's satisfaction.     Logan Bores

## 2022-09-05 NOTE — Discharge Instructions (Signed)
No ibuprofen, Advil, Aleve, Motrin, ketorolac, meloxicam, naproxen, or other NSAIDS until after 3:00pm today if needed for pain.   DISCHARGE INSTRUCTIONS: HYSTEROSCOPY  The following instructions have been prepared to help you care for yourself upon your return home.   Personal hygiene:  Use sanitary pads for vaginal drainage, not tampons.  Shower the day after your procedure.  NO tub baths, pools or Jacuzzis for 2-3 weeks.  Wipe front to back after using the bathroom.  Activity and limitations:  Do NOT drive or operate any equipment for 24 hours. The effects of anesthesia are still present and drowsiness may result.  Do NOT rest in bed all day.  Walking is encouraged.  Walk up and down stairs slowly.  You may resume your normal activity in one to two days or as indicated by your physician. Sexual activity: NO intercourse for at least 2 weeks after the procedure, or as indicated by your Doctor.  Diet: Eat a light meal as desired this evening. You may resume your usual diet tomorrow.  Return to Work: You may resume your work activities in one to two days or as indicated by Marine scientist.  What to expect after your surgery: Expect to have vaginal bleeding/discharge for 2-3 days and spotting for up to 10 days. It is not unusual to have soreness for up to 1-2 weeks. You may have a slight burning sensation when you urinate for the first day. Mild cramps may continue for a couple of days. You may have a regular period in 2-6 weeks.  Call your doctor for any of the following:  Excessive vaginal bleeding or clotting, saturating and changing one pad every hour.  Inability to urinate 6 hours after discharge from hospital.  Pain not relieved by pain medication.  Fever of 100.4 F or greater.  Unusual vaginal discharge or odor.     Post Anesthesia Home Care Instructions  Activity: Get plenty of rest for the remainder of the day. A responsible individual must stay with you for 24 hours  following the procedure.  For the next 24 hours, DO NOT: -Drive a car -Paediatric nurse -Drink alcoholic beverages -Take any medication unless instructed by your physician -Make any legal decisions or sign important papers.  Meals: Start with liquid foods such as gelatin or soup. Progress to regular foods as tolerated. Avoid greasy, spicy, heavy foods. If nausea and/or vomiting occur, drink only clear liquids until the nausea and/or vomiting subsides. Call your physician if vomiting continues.  Special Instructions/Symptoms: Your throat may feel dry or sore from the anesthesia or the breathing tube placed in your throat during surgery. If this causes discomfort, gargle with warm salt water. The discomfort should disappear within 24 hours.

## 2022-09-05 NOTE — Anesthesia Postprocedure Evaluation (Signed)
Anesthesia Post Note  Patient: Terri Gamble  Procedure(s) Performed: DILATATION & CURETTAGE/HYSTEROSCOPY WITH MYOSURE (Uterus) INTRAUTERINE DEVICE (IUD) INSERTION (Cervix)     Patient location during evaluation: PACU Anesthesia Type: General Level of consciousness: awake and alert Pain management: pain level controlled Vital Signs Assessment: post-procedure vital signs reviewed and stable Respiratory status: spontaneous breathing, nonlabored ventilation and respiratory function stable Cardiovascular status: blood pressure returned to baseline and stable Postop Assessment: no apparent nausea or vomiting Anesthetic complications: no   No notable events documented.  Last Vitals:  Vitals:   09/05/22 0945 09/05/22 1000  BP: 124/81 124/75  Pulse: 92 93  Resp: 15 18  Temp:    SpO2: 100% 97%    Last Pain:  Vitals:   09/05/22 0918  TempSrc:   PainSc: [place]

## 2022-09-05 NOTE — Transfer of Care (Signed)
Immediate Anesthesia Transfer of Care Note  Patient: Terri Gamble  Procedure(s) Performed: DILATATION & CURETTAGE/HYSTEROSCOPY WITH MYOSURE (Uterus) INTRAUTERINE DEVICE (IUD) INSERTION (Cervix)  Patient Location: PACU  Anesthesia Type:General  Level of Consciousness: drowsy and patient cooperative  Airway & Oxygen Therapy: Patient Spontanous Breathing and Patient connected to nasal cannula oxygen  Post-op Assessment: Report given to RN and Post -op Vital signs reviewed and stable  Post vital signs: Reviewed and stable  Last Vitals:  Vitals Value Taken Time  BP 132/88 09/05/22 0917  Temp    Pulse 102 09/05/22 0920  Resp 19 09/05/22 0920  SpO2 96 % 09/05/22 0920  Vitals shown include unvalidated device data.  Last Pain:  Vitals:   09/05/22 0750  TempSrc: Oral  PainSc: 2       Patients Stated Pain Goal: 5 (61/47/09 2957)  Complications: No notable events documented.

## 2022-09-10 ENCOUNTER — Encounter (HOSPITAL_BASED_OUTPATIENT_CLINIC_OR_DEPARTMENT_OTHER): Payer: Self-pay | Admitting: Obstetrics and Gynecology

## 2022-09-11 LAB — SURGICAL PATHOLOGY

## 2022-09-12 ENCOUNTER — Encounter: Payer: Self-pay | Admitting: Gynecologic Oncology

## 2022-09-12 ENCOUNTER — Telehealth: Payer: Self-pay

## 2022-09-12 NOTE — Telephone Encounter (Signed)
Spoke with Terri Gamble regarding her referral to GYN oncology. She has an appointment scheduled with Dr. Berline Lopes on 09/19/22 at 9:45. Patient agrees to date and time. She has been provided with office address and location. She is also aware of our mask and visitor policy. Patient verbalized understanding and will call with any questions.

## 2022-09-18 ENCOUNTER — Encounter: Payer: Self-pay | Admitting: Gynecologic Oncology

## 2022-09-18 NOTE — H&P (View-Only) (Signed)
GYNECOLOGIC ONCOLOGY NEW PATIENT CONSULTATION   Patient Name: Terri Gamble  Patient Age: 62 y.o. 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   We reviewed the nature of endometrial cancer and its recommended surgical staging, including total hysterectomy, bilateral salpingo-oophorectomy, and lymph node assessment. The patient is a suitable candidate for staging via a minimally invasive approach to surgery.  We reviewed that robotic assistance would be used to complete the surgery.   We discussed that most endometrial cancer is detected early and that decisions regarding adjuvant therapy will be made based on her final pathology.   We reviewed the sentinel lymph node technique. Risks and benefits of sentinel lymph node biopsy was reviewed. We reviewed the technique and ICG dye. The patient DOES NOT have an iodine allergy or known liver dysfunction. We reviewed the false negative rate (0.4%), and that 3% of patients with metastatic disease will not have it detected by SLN biopsy in endometrial cancer. A low risk of allergic reaction to the dye, <0.2% for ICG, has been reported. We also discussed that in the case of failed mapping, which occurs 40% of the time, a bilateral or unilateral lymphadenectomy will be performed at the surgeon's discretion.   Potential benefits of sentinel nodes including a higher detection rate for metastasis due to ultrastaging and potential reduction in operative morbidity. However, there remains uncertainty as to the role for treatment of micrometastatic disease. Further, the benefit of operative morbidity associated with the SLN technique in endometrial cancer is not yet completely known. In other patient populations (e.g. the cervical cancer population) there  has been observed reductions in morbidity with SLN biopsy compared to pelvic lymphadenectomy. Lymphedema, nerve dysfunction and lymphocysts are all potential risks with the SLN technique as with complete lymphadenectomy. Additional risks to the patient include the risk of damage to an internal organ while operating in an altered view (e.g. the black and white image of the robotic fluorescence imaging mode).   We discussed the plan for a robotic assisted hysterectomy, bilateral salpingo-oophorectomy, sentinel lymph node evaluation, possible lymph node dissection, possible laparotomy. The risks of surgery were discussed in detail and she understands these to include infection; wound separation; hernia; vaginal cuff separation, injury to adjacent organs such as bowel, bladder, blood vessels, ureters and nerves; bleeding which may require blood transfusion; anesthesia risk; thromboembolic events; possible death; unforeseen complications; possible need for re-exploration; medical complications such as heart attack, stroke, pleural effusion and pneumonia; and, if full lymphadenectomy is performed the risk of lymphedema and lymphocyst. The patient will receive DVT and antibiotic prophylaxis as indicated. She voiced a clear understanding. She had the opportunity to ask questions. Perioperative instructions were reviewed with her. Prescriptions for post-op medications were sent to her pharmacy of choice.  Given family history, I recommended referral to genetics to discuss genetic testing. Patient amenable.  A copy of this note was sent to the patient's referring provider.   70 minutes of total time was spent for this patient encounter, including preparation, face-to-face counseling with the patient and coordination of care, and documentation of the encounter.  Jeral Pinch, MD  Division of Gynecologic Oncology  Department of Obstetrics and Gynecology  [facility]   ___________________________________________  Chief Complaint: No chief complaint on file.   History of Present Illness:  Terri  Raliegh Ip Gamble is a 62 y.o. y.o. female who is seen in consultation at the request of Dr. Marvel Plan for an evaluation of endometrial cancer.  The patient endorses having a history of episodic bleeding since she went through menopause at 1.  In 2018, she had a polyp seen on a saline infused ultrasound and underwent operative hysteroscopy with resection of polyps and a submucosal fibroid.  Pathology at that time revealed a benign endometrial polyp.  In March 2023, she began having spotting.  This was episodic like a menses for several months.  In general, bleeding was light with only 1 day each time requiring the use of a pad.  She was [place] for an annual exam with Dr. Marvel Plan in May.  Her bleeding had basically stopped by that time.  She also had associated abdominal pain.  By May, this had improved.  Cramping continued intermittently but she denies any further bleeding.  Endometrial biopsy on 01/21/2022 revealed inflamed benign endocervical mucosa.  Scant benign squamous mucosa and scant inactive endometrium with breakdown.  Abundant background mucoid inflammatory material and red blood cells. Pelvic ultrasound at [facility] on 07/21/2022 revealed a uterus measuring 9.4 x 6 x 4.9 cm with an endometrial lining of 21.5 mm.  Several small fibroids noted.  Polyp measuring approximately 25 mm seen on sonohysterogram. Patient was taken to the OR for hysteroscopic resection of polyps and endometrial sampling with MyoSure as well as Mirena IUD placement.  Findings included multiple endometrial polyps with some proliferative appearing lining noted.  Endometrial curettage on 12/29 revealed polyp with extensive EIN with foci of well-differentiated endometrial carcinoma.  Since her procedure, bleeding has stopped.  Her abdominal cramping has significantly improved.  She  endorses a good appetite without nausea or emesis.  Reports normal bowel bladder function.  Family history is notable for a brother with colon cancer.  She has multiple cousins with cancer, unsure which kind.  Maternal aunt had pancreatic cancer, paternal uncle had stomach cancer.  PAST MEDICAL HISTORY:  Past Medical History:  Diagnosis Date   Complication of anesthesia    per pt with d&c hysteroscopy done at [facility] 07-03-2017 felt dizzy, headaches, and just did not feel well   Diverticulosis of colon    Endometrial polyp    History of URI (upper respiratory infection) 08/25/2022   recent dx acute bacterial sinusitis and acute cough by pcp note in epic 08-25-2022 per pt had sympstoms for 1 wk prior to dx ,  pt stated completed antibiotic and prednisone on 12/ 25/ 2023   HSV infection    oral symptoms, uses valtrex for symptoms   Hypertension    Menorrhagia    Seasonal allergies    Thrombocytosis    Uterine fibroid      PAST SURGICAL HISTORY:  Past Surgical History:  Procedure Laterality Date   CESAREAN SECTION  1977, 1984   COLONOSCOPY WITH PROPOFOL  06/05/2015   dr stark   DILATATION & CURETTAGE/HYSTEROSCOPY WITH MYOSURE N/A 07/03/2017   Procedure: DILATATION & CURETTAGE/HYSTEROSCOPY WITH MYOSURE;  Surgeon: Paula Compton, MD;  Location: [facility];  Service: Gynecology;  Laterality: N/A;  MD request 30 extra minutes   [place] N/A 09/05/2022   Procedure: DILATATION & CURETTAGE/HYSTEROSCOPY WITH MYOSURE;  Surgeon: Paula Compton, MD;  Location: [place];  Service: Gynecology;  Laterality: N/A;   HYSTEROSCOPY WITH D & C  09/04/2004   '@[facility]'$  by dr Marvel Plan   INTRAUTERINE DEVICE (IUD) INSERTION N/A 09/05/2022  Procedure: INTRAUTERINE DEVICE (IUD) INSERTION;  Surgeon: Paula Compton, MD;  Location: [facility];  Service: Gynecology;  Laterality: N/A;   POLYPECTOMY  09/05/2022    OB/GYN HISTORY:  OB History   Gravida Para Term Preterm AB Living  '4 2       2  '$ SAB IAB Ectopic Multiple Live Births               # Outcome Date GA Lbr Len/2nd Weight Sex Delivery Anes PTL Lv  4 Gravida           3 Gravida           2 Para           1 Para             No LMP recorded. Patient is postmenopausal.  Age at menarche: 73 Age at menopause: 54 Hx of HRT: denies Hx of STDs: Oral HSV Last pap: 01/2022 - NIML, HR HPV negative History of abnormal pap smears: Denies  SCREENING STUDIES:  Last mammogram: 2023  Last colonoscopy: 2016  MEDICATIONS: Outpatient Encounter Medications as of 09/19/2022  Medication Sig   ibuprofen (ADVIL) 800 MG tablet Take 1 tablet (800 mg total) by mouth every 8 (eight) hours as needed for moderate pain. For AFTER surgery only   senna-docusate (SENOKOT-S) 8.6-50 MG tablet Take 2 tablets by mouth at bedtime. For AFTER surgery, do not take if having diarrhea   traMADol (ULTRAM) 50 MG tablet Take 1 tablet (50 mg total) by mouth every 6 (six) hours as needed for severe pain. For AFTER surgery only, do not take and drive   Biotin 3 MG TABS Take 1 tablet by mouth daily.   lisinopril (ZESTRIL) 10 MG tablet Take 1 tablet by mouth daily.   ondansetron (ZOFRAN-ODT) 4 MG disintegrating tablet Take 1 tablet (4 mg total) by mouth every 8 (eight) hours as needed for nausea or vomiting.   valACYclovir (VALTREX) 500 MG tablet Take 500 mg by mouth 2 (two) times daily as needed (for fever blisters).   [DISCONTINUED] ibuprofen (ADVIL,MOTRIN) 200 MG tablet Take 400 mg by mouth every 6 (six) hours as needed.   No facility-administered encounter medications on file as of 09/19/2022.    ALLERGIES:  Allergies  Allergen Reactions   Codeine Nausea Only   Morphine And Related Other (See Comments)    Headache   Latex Rash     FAMILY HISTORY:  Family History  Problem Relation Age of Onset   Heart disease Mother    Kidney disease Mother    Hypertension Mother    Diabetes Mother    Colon  cancer Brother    Diabetes Maternal Grandmother    Hypertension Maternal Grandmother    Heart disease Maternal Grandmother    Stroke Maternal Grandmother    Heart disease Maternal Grandfather    Cardiomyopathy Maternal Grandfather    Heart disease Paternal Grandmother    Stroke Paternal Grandmother    Hypertension Paternal Grandmother    Alzheimer's disease Paternal Grandfather    Pancreatic cancer Maternal Aunt    Lung cancer Maternal Uncle    Stomach cancer Paternal Uncle        x 2   Lung cancer Paternal Uncle    Esophageal cancer Neg Hx    Liver disease Neg Hx    Ovarian cancer Neg Hx    Breast cancer Neg Hx      SOCIAL HISTORY:  Social Connections: Not on file  REVIEW OF SYSTEMS:  Denies appetite changes, fevers, chills, fatigue, unexplained weight changes. Denies hearing loss, neck lumps or masses, mouth sores, ringing in ears or voice changes. Denies cough or wheezing.  Denies shortness of breath. Denies chest pain or palpitations. Denies leg swelling. Denies abdominal distention, pain, blood in stools, constipation, diarrhea, nausea, vomiting, or early satiety. Denies pain with intercourse, dysuria, frequency, hematuria or incontinence. Denies hot flashes, pelvic pain, vaginal bleeding or vaginal discharge.   Denies joint pain, back pain or muscle pain/cramps. Denies itching, rash, or wounds. Denies dizziness, headaches, numbness or seizures. Denies swollen lymph nodes or glands, denies easy bruising or bleeding. Denies anxiety, depression, confusion, or decreased concentration.  Physical Exam:  Vital Signs for this encounter:  Blood pressure (!) 148/77, pulse (!) 103, temperature 98.5 F (36.9 C), temperature source Oral, resp. rate 16, height 5' 1.81" (1.57 m), weight 254 lb 6.4 oz (115.4 kg), SpO2 100 %. Body mass index is 46.82 kg/m. General: Alert, oriented, no acute distress.  HEENT: Normocephalic, atraumatic. Sclera anicteric.  Chest: Clear to  auscultation bilaterally. No wheezes, rhonchi, or rales. Cardiovascular: Regular rate and rhythm, no murmurs, rubs, or gallops.  Abdomen: Obese. Normoactive bowel sounds. Soft, nondistended, nontender to palpation. No masses or hepatosplenomegaly appreciated. No palpable fluid wave.  Well-healed Pfannenstiel incision. Extremities: Grossly normal range of motion. Warm, well perfused. No edema bilaterally.  Skin: No rashes or lesions.  Lymphatics: No cervical, supraclavicular, or inguinal adenopathy.  GU:  Normal external female genitalia. No lesions. No discharge or bleeding.             Bladder/urethra:  No lesions or masses, well supported bladder             Vagina: Mildly atrophic vaginal mucosa, no lesions appreciated.             Cervix: Normal appearing, no lesions.  IUD strings appreciated.             Uterus: Moderately mobile, 8-10 cm, no parametrial involvement or nodularity.             Adnexa: No masses palpated.  Rectal: Deferred.  LABORATORY AND RADIOLOGIC DATA:  Outside medical records were reviewed to synthesize the above history, along with the history and physical obtained during the visit.   Lab Results  Component Value Date   WBC 12.0 (H) 09/05/2022   HGB 14.3 09/05/2022   HCT 42.0 09/05/2022   PLT 390 09/05/2022   GLUCOSE 95 09/05/2022   CHOL 205 (A) 08/03/2019   TRIG 91 08/03/2019   HDL 66 08/03/2019   LDLCALC 123 08/03/2019   ALT 18 11/18/2021   AST 15 11/18/2021   NA 140 09/05/2022   K 4.6 09/05/2022   CL 104 09/05/2022   CREATININE 0.70 09/05/2022   BUN 28 (H) 09/05/2022   CO2 24 11/18/2021   TSH 0.57 08/03/2019   HGBA1C 5.4 04/15/2018

## 2022-09-18 NOTE — Progress Notes (Signed)
GYNECOLOGIC ONCOLOGY NEW PATIENT CONSULTATION   Patient Name: Terri Gamble  Patient Age: 62 y.o. 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   We reviewed the nature of endometrial cancer and its recommended surgical staging, including total hysterectomy, bilateral salpingo-oophorectomy, and lymph node assessment. The patient is a suitable candidate for staging via a minimally invasive approach to surgery.  We reviewed that robotic assistance would be used to complete the surgery.   We discussed that most endometrial cancer is detected early and that decisions regarding adjuvant therapy will be made based on her final pathology.   We reviewed the sentinel lymph node technique. Risks and benefits of sentinel lymph node biopsy was reviewed. We reviewed the technique and ICG dye. The patient DOES NOT have an iodine allergy or known liver dysfunction. We reviewed the false negative rate (0.4%), and that 3% of patients with metastatic disease will not have it detected by SLN biopsy in endometrial cancer. A low risk of allergic reaction to the dye, <0.2% for ICG, has been reported. We also discussed that in the case of failed mapping, which occurs 40% of the time, a bilateral or unilateral lymphadenectomy will be performed at the surgeon's discretion.   Potential benefits of sentinel nodes including a higher detection rate for metastasis due to ultrastaging and potential reduction in operative morbidity. However, there remains uncertainty as to the role for treatment of micrometastatic disease. Further, the benefit of operative morbidity associated with the SLN technique in endometrial cancer is not yet completely known. In other patient populations (e.g. the cervical cancer population) there  has been observed reductions in morbidity with SLN biopsy compared to pelvic lymphadenectomy. Lymphedema, nerve dysfunction and lymphocysts are all potential risks with the SLN technique as with complete lymphadenectomy. Additional risks to the patient include the risk of damage to an internal organ while operating in an altered view (e.g. the black and white image of the robotic fluorescence imaging mode).   We discussed the Gamble for a robotic assisted hysterectomy, bilateral salpingo-oophorectomy, sentinel lymph node evaluation, possible lymph node dissection, possible laparotomy. The risks of surgery were discussed in detail and she understands these to include infection; wound separation; hernia; vaginal cuff separation, injury to adjacent organs such as bowel, bladder, blood vessels, ureters and nerves; bleeding which may require blood transfusion; anesthesia risk; thromboembolic events; possible death; unforeseen complications; possible need for re-exploration; medical complications such as heart attack, stroke, pleural effusion and pneumonia; and, if full lymphadenectomy is performed the risk of lymphedema and lymphocyst. The patient will receive DVT and antibiotic prophylaxis as indicated. She voiced a clear understanding. She had the opportunity to ask questions. Perioperative instructions were reviewed with her. Prescriptions for post-op medications were sent to her pharmacy of choice.  Given family history, I recommended referral to genetics to discuss genetic testing. Patient amenable.  A copy of this note was sent to the patient's referring provider.   70 minutes of total time was spent for this patient encounter, including preparation, face-to-face counseling with the patient and coordination of care, and documentation of the encounter.  Terri Gamble  Division of Gynecologic Oncology  Department of Obstetrics and Gynecology  [facility]   ___________________________________________  Chief Complaint: No chief complaint on file.   History of Present Illness:  Terri Gamble  Terri Gamble is a 62 y.o. y.o. female who is seen in consultation at the request of Terri Gamble for an evaluation of endometrial cancer.  The patient endorses having a history of episodic bleeding since she went through menopause at 58.  In 2018, she had a polyp seen on a saline infused ultrasound and underwent operative hysteroscopy with resection of polyps and a submucosal fibroid.  Pathology at that time revealed a benign endometrial polyp.  In March 2023, she began having spotting.  This was episodic like a menses for several months.  In general, bleeding was light with only 1 day each time requiring the use of a pad.  She was [place] for an annual exam with Terri Gamble in May.  Her bleeding had basically stopped by that time.  She also had associated abdominal pain.  By May, this had improved.  Cramping continued intermittently but she denies any further bleeding.  Endometrial biopsy on 01/21/2022 revealed inflamed benign endocervical mucosa.  Scant benign squamous mucosa and scant inactive endometrium with breakdown.  Abundant background mucoid inflammatory material and red blood cells. Pelvic ultrasound at [facility] on 07/21/2022 revealed a uterus measuring 9.4 x 6 x 4.9 cm with an endometrial lining of 21.5 mm.  Several small fibroids noted.  Polyp measuring approximately 25 mm seen on sonohysterogram. Patient was taken to the OR for hysteroscopic resection of polyps and endometrial sampling with MyoSure as well as Mirena IUD placement.  Findings included multiple endometrial polyps with some proliferative appearing lining noted.  Endometrial curettage on 12/29 revealed polyp with extensive EIN with foci of well-differentiated endometrial carcinoma.  Since her procedure, bleeding has stopped.  Her abdominal cramping has significantly improved.  She  endorses a good appetite without nausea or emesis.  Reports normal bowel bladder function.  Family history is notable for a brother with colon cancer.  She has multiple cousins with cancer, unsure which kind.  Maternal aunt had pancreatic cancer, paternal uncle had stomach cancer.  PAST MEDICAL HISTORY:  Past Medical History:  Diagnosis Date   Complication of anesthesia    per pt with d&c hysteroscopy done at [facility] 07-03-2017 felt dizzy, headaches, and just did not feel well   Diverticulosis of colon    Endometrial polyp    History of URI (upper respiratory infection) 08/25/2022   recent dx acute bacterial sinusitis and acute cough by pcp note in epic 08-25-2022 per pt had sympstoms for 1 wk prior to dx ,  pt stated completed antibiotic and prednisone on 12/ 25/ 2023   HSV infection    oral symptoms, uses valtrex for symptoms   Hypertension    Menorrhagia    Seasonal allergies    Thrombocytosis    Uterine fibroid      PAST SURGICAL HISTORY:  Past Surgical History:  Procedure Laterality Date   CESAREAN SECTION  1977, 1984   COLONOSCOPY WITH PROPOFOL  06/05/2015   dr stark   DILATATION & CURETTAGE/HYSTEROSCOPY WITH MYOSURE N/A 07/03/2017   Procedure: DILATATION & CURETTAGE/HYSTEROSCOPY WITH MYOSURE;  Surgeon: Terri Compton, Gamble;  Location: [facility];  Service: Gynecology;  Laterality: N/A;  Gamble request 30 extra minutes   [place] N/A 09/05/2022   Procedure: DILATATION & CURETTAGE/HYSTEROSCOPY WITH MYOSURE;  Surgeon: Terri Compton, Gamble;  Location: [facility];  Service: Gynecology;  Laterality: N/A;   HYSTEROSCOPY WITH D & C  09/04/2004   '@[facility]'$  by dr Terri Gamble   INTRAUTERINE DEVICE (IUD) INSERTION N/A 09/05/2022  Procedure: INTRAUTERINE DEVICE (IUD) INSERTION;  Surgeon: Terri Compton, Gamble;  Location: [facility];  Service: Gynecology;  Laterality: N/A;   POLYPECTOMY  09/05/2022    OB/GYN HISTORY:  OB History   Gravida Para Term Preterm AB Living  '4 2       2  '$ SAB IAB Ectopic Multiple Live Births               # Outcome Date GA Lbr Len/2nd Weight Sex Delivery Anes PTL Lv  4 Gravida           3 Gravida           2 Para           1 Para             No LMP recorded. Patient is postmenopausal.  Age at menarche: 89 Age at menopause: 85 Hx of HRT: denies Hx of STDs: Oral HSV Last pap: 01/2022 - NIML, HR HPV negative History of abnormal pap smears: Denies  SCREENING STUDIES:  Last mammogram: 2023  Last colonoscopy: 2016  MEDICATIONS: Outpatient Encounter Medications as of 09/19/2022  Medication Sig   ibuprofen (ADVIL) 800 MG tablet Take 1 tablet (800 mg total) by mouth every 8 (eight) hours as needed for moderate pain. For AFTER surgery only   senna-docusate (SENOKOT-S) 8.6-50 MG tablet Take 2 tablets by mouth at bedtime. For AFTER surgery, do not take if having diarrhea   traMADol (ULTRAM) 50 MG tablet Take 1 tablet (50 mg total) by mouth every 6 (six) hours as needed for severe pain. For AFTER surgery only, do not take and drive   Biotin 3 MG TABS Take 1 tablet by mouth daily.   lisinopril (ZESTRIL) 10 MG tablet Take 1 tablet by mouth daily.   ondansetron (ZOFRAN-ODT) 4 MG disintegrating tablet Take 1 tablet (4 mg total) by mouth every 8 (eight) hours as needed for nausea or vomiting.   valACYclovir (VALTREX) 500 MG tablet Take 500 mg by mouth 2 (two) times daily as needed (for fever blisters).   [DISCONTINUED] ibuprofen (ADVIL,MOTRIN) 200 MG tablet Take 400 mg by mouth every 6 (six) hours as needed.   No facility-administered encounter medications on file as of 09/19/2022.    ALLERGIES:  Allergies  Allergen Reactions   Codeine Nausea Only   Morphine And Related Other (See Comments)    Headache   Latex Rash     FAMILY HISTORY:  Family History  Problem Relation Age of Onset   Heart disease Mother    Kidney disease Mother    Hypertension Mother    Diabetes Mother    Colon  cancer Brother    Diabetes Maternal Grandmother    Hypertension Maternal Grandmother    Heart disease Maternal Grandmother    Stroke Maternal Grandmother    Heart disease Maternal Grandfather    Cardiomyopathy Maternal Grandfather    Heart disease Paternal Grandmother    Stroke Paternal Grandmother    Hypertension Paternal Grandmother    Alzheimer's disease Paternal Grandfather    Pancreatic cancer Maternal Aunt    Lung cancer Maternal Uncle    Stomach cancer Paternal Uncle        x 2   Lung cancer Paternal Uncle    Esophageal cancer Neg Hx    Liver disease Neg Hx    Ovarian cancer Neg Hx    Breast cancer Neg Hx      SOCIAL HISTORY:  Social Connections: Not on file  REVIEW OF SYSTEMS:  Denies appetite changes, fevers, chills, fatigue, unexplained weight changes. Denies hearing loss, neck lumps or masses, mouth sores, ringing in ears or voice changes. Denies cough or wheezing.  Denies shortness of breath. Denies chest pain or palpitations. Denies leg swelling. Denies abdominal distention, pain, blood in stools, constipation, diarrhea, nausea, vomiting, or early satiety. Denies pain with intercourse, dysuria, frequency, hematuria or incontinence. Denies hot flashes, pelvic pain, vaginal bleeding or vaginal discharge.   Denies joint pain, back pain or muscle pain/cramps. Denies itching, rash, or wounds. Denies dizziness, headaches, numbness or seizures. Denies swollen lymph nodes or glands, denies easy bruising or bleeding. Denies anxiety, depression, confusion, or decreased concentration.  Physical Exam:  Vital Signs for this encounter:  Blood pressure (!) 148/77, pulse (!) 103, temperature 98.5 F (36.9 C), temperature source Oral, resp. rate 16, height 5' 1.81" (1.57 m), weight 254 lb 6.4 oz (115.4 kg), SpO2 100 %. Body mass index is 46.82 kg/m. General: Alert, oriented, no acute distress.  HEENT: Normocephalic, atraumatic. Sclera anicteric.  Chest: Clear to  auscultation bilaterally. No wheezes, rhonchi, or rales. Cardiovascular: Regular rate and rhythm, no murmurs, rubs, or gallops.  Abdomen: Obese. Normoactive bowel sounds. Soft, nondistended, nontender to palpation. No masses or hepatosplenomegaly appreciated. No palpable fluid wave.  Well-healed Pfannenstiel incision. Extremities: Grossly normal range of motion. Warm, well perfused. No edema bilaterally.  Skin: No rashes or lesions.  Lymphatics: No cervical, supraclavicular, or inguinal adenopathy.  GU:  Normal external female genitalia. No lesions. No discharge or bleeding.             Bladder/urethra:  No lesions or masses, well supported bladder             Vagina: Mildly atrophic vaginal mucosa, no lesions appreciated.             Cervix: Normal appearing, no lesions.  IUD strings appreciated.             Uterus: Moderately mobile, 8-10 cm, no parametrial involvement or nodularity.             Adnexa: No masses palpated.  Rectal: Deferred.  LABORATORY AND RADIOLOGIC DATA:  Outside medical records were reviewed to synthesize the above history, along with the history and physical obtained during the visit.   Lab Results  Component Value Date   WBC 12.0 (H) 09/05/2022   HGB 14.3 09/05/2022   HCT 42.0 09/05/2022   PLT 390 09/05/2022   GLUCOSE 95 09/05/2022   CHOL 205 (A) 08/03/2019   TRIG 91 08/03/2019   HDL 66 08/03/2019   LDLCALC 123 08/03/2019   ALT 18 11/18/2021   AST 15 11/18/2021   NA 140 09/05/2022   K 4.6 09/05/2022   CL 104 09/05/2022   CREATININE 0.70 09/05/2022   BUN 28 (H) 09/05/2022   CO2 24 11/18/2021   TSH 0.57 08/03/2019   HGBA1C 5.4 04/15/2018

## 2022-09-19 ENCOUNTER — Ambulatory Visit: Payer: Managed Care, Other (non HMO) | Admitting: Gynecologic Oncology

## 2022-09-19 ENCOUNTER — Encounter: Payer: Self-pay | Admitting: Gynecologic Oncology

## 2022-09-19 ENCOUNTER — Other Ambulatory Visit: Payer: Self-pay | Admitting: Gynecologic Oncology

## 2022-09-19 ENCOUNTER — Other Ambulatory Visit: Payer: Self-pay

## 2022-09-19 ENCOUNTER — Inpatient Hospital Stay: Payer: Managed Care, Other (non HMO) | Attending: Gynecologic Oncology | Admitting: Gynecologic Oncology

## 2022-09-19 ENCOUNTER — Inpatient Hospital Stay (HOSPITAL_BASED_OUTPATIENT_CLINIC_OR_DEPARTMENT_OTHER): Payer: Managed Care, Other (non HMO) | Admitting: Gynecologic Oncology

## 2022-09-19 VITALS — BP 148/77 | HR 103 | Temp 98.5°F | Resp 16 | Ht 61.81 in | Wt 254.4 lb

## 2022-09-19 DIAGNOSIS — C541 Malignant neoplasm of endometrium: Secondary | ICD-10-CM

## 2022-09-19 DIAGNOSIS — I10 Essential (primary) hypertension: Secondary | ICD-10-CM | POA: Insufficient documentation

## 2022-09-19 DIAGNOSIS — Z6841 Body Mass Index (BMI) 40.0 and over, adult: Secondary | ICD-10-CM | POA: Insufficient documentation

## 2022-09-19 DIAGNOSIS — N840 Polyp of corpus uteri: Secondary | ICD-10-CM | POA: Insufficient documentation

## 2022-09-19 DIAGNOSIS — Z800 Family history of malignant neoplasm of digestive organs: Secondary | ICD-10-CM | POA: Insufficient documentation

## 2022-09-19 MED ORDER — SENNOSIDES-DOCUSATE SODIUM 8.6-50 MG PO TABS: 2.0000 | ORAL_TABLET | Freq: Every day | ORAL | 0 refills | Status: DC

## 2022-09-19 MED ORDER — IBUPROFEN 800 MG PO TABS: 800.0000 mg | ORAL_TABLET | Freq: Three times a day (TID) | ORAL | 0 refills | Status: DC | PRN

## 2022-09-19 MED ORDER — TRAMADOL HCL 50 MG PO TABS: 50.0000 mg | ORAL_TABLET | Freq: Four times a day (QID) | ORAL | 0 refills | Status: DC | PRN

## 2022-09-19 NOTE — Patient Instructions (Signed)
Preparing for your Surgery  Plan for surgery on October 22, 2022 with Dr. Jeral Pinch at [facility] will be scheduled for robotic assisted total laparoscopic hysterectomy (removal of the uterus and cervix), bilateral salpingo-oophorectomy (removal of both ovaries and fallopian tubes), sentinel lymph node biopsy, possible lymph node dissection, possible laparotomy (larger incision on your abdomen if needed).   There is a chance we will get a sooner surgery date if a spot opens up.   Pre-operative Testing -You will receive a phone call from presurgical testing at [facility] to arrange for a pre-operative appointment and lab work.  -Bring your insurance card, copy of an advanced directive if applicable, medication list  -At that visit, you will be asked to sign a consent for a possible blood transfusion in case a transfusion becomes necessary during surgery.  The need for a blood transfusion is rare but having consent is a necessary part of your care.     -You should not be taking blood thinners or aspirin at least ten days prior to surgery unless instructed by your surgeon.  -Do not take supplements such as fish oil (omega 3), red yeast rice, turmeric before your surgery. You want to avoid medications with aspirin in them including headache powders such as BC or Goody's), Excedrin migraine.  Day Before Surgery at [facility] will be asked to take in a light diet the day before surgery. You will be advised you can have clear liquids up until 3 hours before your surgery.    Eat a light diet the day before surgery.  Examples including soups, broths, toast, yogurt, mashed potatoes.  AVOID GAS PRODUCING FOODS. Things to avoid include carbonated beverages (fizzy beverages, sodas), raw fruits and raw vegetables (uncooked), or beans.   If your bowels are filled with gas, your surgeon will have difficulty visualizing your pelvic organs which increases your surgical  risks.  Your role in recovery Your role is to become active as soon as directed by your doctor, while still giving yourself time to heal.  Rest when you feel tired. You will be asked to do the following in order to speed your recovery:  - Cough and breathe deeply. This helps to clear and expand your lungs and can prevent pneumonia after surgery.  - [place]. Do mild physical activity. Walking or moving your legs help your circulation and body functions return to normal. Do not try to get up or walk alone the first time after surgery.   -If you develop swelling on one leg or the other, pain in the back of your leg, redness/warmth in one of your legs, please call the office or go to the Emergency Room to have a doppler to rule out a blood clot. For shortness of breath, chest pain-seek care in the Emergency Room as soon as possible. - Actively manage your pain. Managing your pain lets you move in comfort. We will ask you to rate your pain on a scale of zero to 10. It is your responsibility to tell your doctor or nurse where and how much you hurt so your pain can be treated.  Special Considerations -If you are diabetic, you may be placed on insulin after surgery to have closer control over your blood sugars to promote healing and recovery.  This does not mean that you will be discharged on insulin.  If applicable, your oral antidiabetics will be resumed when you are tolerating a solid diet.  -  Your final pathology results from surgery should be available around one week after surgery and the results will be relayed to you when available.  -FMLA forms can be faxed to [phone] and please allow 5-7 business days for completion.  Pain Management After Surgery -You have been prescribed your pain medication and bowel regimen medications before surgery so that you can have these available when you are discharged from the hospital. The pain medication is for use ONLY AFTER surgery and a  new prescription will not be given.   -Make sure that you have Tylenol and Ibuprofen IF YOU ARE ABLE TO TAKE THESE MEDICATIONS at home to use on a regular basis after surgery for pain control. We recommend alternating the medications every hour to six hours since they work differently and are processed in the body differently for pain relief.  -Review the attached handout on narcotic use and their risks and side effects.   Bowel Regimen -You have been prescribed Sennakot-S to take nightly to prevent constipation especially if you are taking the narcotic pain medication intermittently.  It is important to prevent constipation and drink adequate amounts of liquids. You can stop taking this medication when you are not taking pain medication and you are back on your normal bowel routine.  Risks of Surgery Risks of surgery are low but include bleeding, infection, damage to surrounding structures, re-operation, blood clots, and very rarely death.   Blood Transfusion Information (For the consent to be signed before surgery)  We will be checking your blood type before surgery so in case of emergencies, we will know what type of blood you would need.                                            WHAT IS A BLOOD TRANSFUSION?  A transfusion is the replacement of blood or some of its parts. Blood is made up of multiple cells which provide different functions. Red blood cells carry oxygen and are used for blood loss replacement. White blood cells fight against infection. Platelets control bleeding. Plasma helps clot blood. Other blood products are available for specialized needs, such as hemophilia or other clotting disorders. BEFORE THE TRANSFUSION  Who gives blood for transfusions?  You may be able to donate blood to be used at a later date on yourself (autologous donation). Relatives can be asked to donate blood. This is generally not any safer than if you have received blood from a stranger. The same  precautions are taken to ensure safety when a relative's blood is donated. Healthy volunteers who are fully evaluated to make sure their blood is safe. This is blood bank blood. Transfusion therapy is the safest it has ever been in the practice of medicine. Before blood is taken from a donor, a complete history is taken to make sure that person has no history of diseases nor engages in risky social behavior (examples are intravenous drug use or sexual activity with multiple partners). The donor's travel history is screened to minimize risk of transmitting infections, such as malaria. The donated blood is tested for signs of infectious diseases, such as HIV and hepatitis. The blood is then tested to be sure it is compatible with you in order to minimize the chance of a transfusion reaction. If you or a relative donates blood, this is often done in anticipation of surgery and is not  appropriate for emergency situations. It takes many days to process the donated blood. RISKS AND COMPLICATIONS Although transfusion therapy is very safe and saves many lives, the main dangers of transfusion include:  Getting an infectious disease. Developing a transfusion reaction. This is an allergic reaction to something in the blood you were given. Every precaution is taken to prevent this. The decision to have a blood transfusion has been considered carefully by your caregiver before blood is given. Blood is not given unless the benefits outweigh the risks.  AFTER SURGERY INSTRUCTIONS  Return to work: 4-6 weeks if applicable  Activity: 1. Be up and out of the bed during the day.  Take a nap if needed.  You may walk up steps but be careful and use the hand rail.  Stair climbing will tire you more than you think, you may need to stop part way and rest.   2. No lifting or straining for 6 weeks over 10 pounds. No pushing, pulling, straining for 6 weeks.  3. No driving for around 1 week(s).  Do not drive if you are taking  narcotic pain medicine and make sure that your reaction time has returned.   4. You can shower as soon as the next day after surgery. Shower daily.  Use your regular soap and water (not directly on the incision) and pat your incision(s) dry afterwards; don't rub.  No tub baths or submerging your body in water until cleared by your surgeon. If you have the soap that was given to you by pre-surgical testing that was used before surgery, you do not need to use it afterwards because this can irritate your incisions.   5. No sexual activity and nothing in the vagina for 10-12 weeks.  6. You may experience a small amount of clear drainage from your incisions, which is normal.  If the drainage persists, increases, or changes color please call the office.  7. Do not use creams, lotions, or ointments such as neosporin on your incisions after surgery until advised by your surgeon because they can cause removal of the dermabond glue on your incisions.    8. You may experience vaginal spotting after surgery or around the 6-8 week mark from surgery when the stitches at the top of the vagina begin to dissolve.  The spotting is normal but if you experience heavy bleeding, call our office.  9. Take Tylenol or ibuprofen first for pain if you are able to take these medications and only use narcotic pain medication for severe pain not relieved by the Tylenol or Ibuprofen.  Monitor your Tylenol intake to a max of 4,000 mg in a 24 hour period. You can alternate these medications after surgery.  Diet: 1. Low sodium Heart Healthy Diet is recommended but you are cleared to resume your normal (before surgery) diet after your procedure.  2. It is safe to use a laxative, such as Miralax or Colace, if you have difficulty moving your bowels. You have been prescribed Sennakot-S to take at bedtime every evening after surgery to keep bowel movements regular and to prevent constipation.    Wound Care: 1. Keep clean and dry.   Shower daily.  Reasons to call the Doctor: Fever - Oral temperature greater than 100.4 degrees Fahrenheit Foul-smelling vaginal discharge Difficulty urinating Nausea and vomiting Increased pain at the site of the incision that is unrelieved with pain medicine. Difficulty breathing with or without chest pain New calf pain especially if only on one side Sudden, continuing  increased vaginal bleeding with or without clots.   Contacts: For questions or concerns you should contact:  Dr. Jeral Pinch at [phone]  Joylene John, NP at [phone]  After Hours: call [phone] and have the GYN Oncologist paged/contacted (after 5 pm or on the weekends).  Messages sent via mychart are for non-urgent matters and are not responded to after hours so for urgent needs, please call the after hours number.

## 2022-09-22 NOTE — Patient Instructions (Signed)
Preparing for your Surgery   Plan for surgery on October 22, 2022 with Dr. Jeral Pinch at [facility] will be scheduled for robotic assisted total laparoscopic hysterectomy (removal of the uterus and cervix), bilateral salpingo-oophorectomy (removal of both ovaries and fallopian tubes), sentinel lymph node biopsy, possible lymph node dissection, possible laparotomy (larger incision on your abdomen if needed).    There is a chance we will get a sooner surgery date if a spot opens up.    Pre-operative Testing -You will receive a phone call from presurgical testing at [facility] to arrange for a pre-operative appointment and lab work.   -Bring your insurance card, copy of an advanced directive if applicable, medication list   -At that visit, you will be asked to sign a consent for a possible blood transfusion in case a transfusion becomes necessary during surgery.  The need for a blood transfusion is rare but having consent is a necessary part of your care.      -You should not be taking blood thinners or aspirin at least ten days prior to surgery unless instructed by your surgeon.   -Do not take supplements such as fish oil (omega 3), red yeast rice, turmeric before your surgery. You want to avoid medications with aspirin in them including headache powders such as BC or Goody's), Excedrin migraine.   Day Before Surgery at [place] will be asked to take in a light diet the day before surgery. You will be advised you can have clear liquids up until 3 hours before your surgery.     Eat a light diet the day before surgery.  Examples including soups, broths, toast, yogurt, mashed potatoes.  AVOID GAS PRODUCING FOODS. Things to avoid include carbonated beverages (fizzy beverages, sodas), raw fruits and raw vegetables (uncooked), or beans.    If your bowels are filled with gas, your surgeon will have difficulty visualizing your pelvic organs which increases your surgical  risks.   Your role in recovery Your role is to become active as soon as directed by your doctor, while still giving yourself time to heal.  Rest when you feel tired. You will be asked to do the following in order to speed your recovery:   - Cough and breathe deeply. This helps to clear and expand your lungs and can prevent pneumonia after surgery.  - [place]. Do mild physical activity. Walking or moving your legs help your circulation and body functions return to normal. Do not try to get up or walk alone the first time after surgery.   -If you develop swelling on one leg or the other, pain in the back of your leg, redness/warmth in one of your legs, please call the office or go to the Emergency Room to have a doppler to rule out a blood clot. For shortness of breath, chest pain-seek care in the Emergency Room as soon as possible. - Actively manage your pain. Managing your pain lets you move in comfort. We will ask you to rate your pain on a scale of zero to 10. It is your responsibility to tell your doctor or nurse where and how much you hurt so your pain can be treated.   Special Considerations -If you are diabetic, you may be placed on insulin after surgery to have closer control over your blood sugars to promote healing and recovery.  This does not mean that you will be discharged on insulin.  If applicable, your  oral antidiabetics will be resumed when you are tolerating a solid diet.   -Your final pathology results from surgery should be available around one week after surgery and the results will be relayed to you when available.   -FMLA forms can be faxed to [phone] and please allow 5-7 business days for completion.   Pain Management After Surgery -You have been prescribed your pain medication and bowel regimen medications before surgery so that you can have these available when you are discharged from the hospital. The pain medication is for use ONLY AFTER surgery  and a new prescription will not be given.    -Make sure that you have Tylenol and Ibuprofen IF YOU ARE ABLE TO TAKE THESE MEDICATIONS at home to use on a regular basis after surgery for pain control. We recommend alternating the medications every hour to six hours since they work differently and are processed in the body differently for pain relief.   -Review the attached handout on narcotic use and their risks and side effects.    Bowel Regimen -You have been prescribed Sennakot-S to take nightly to prevent constipation especially if you are taking the narcotic pain medication intermittently.  It is important to prevent constipation and drink adequate amounts of liquids. You can stop taking this medication when you are not taking pain medication and you are back on your normal bowel routine.   Risks of Surgery Risks of surgery are low but include bleeding, infection, damage to surrounding structures, re-operation, blood clots, and very rarely death.     Blood Transfusion Information (For the consent to be signed before surgery)   We will be checking your blood type before surgery so in case of emergencies, we will know what type of blood you would need.                                             WHAT IS A BLOOD TRANSFUSION?   A transfusion is the replacement of blood or some of its parts. Blood is made up of multiple cells which provide different functions. Red blood cells carry oxygen and are used for blood loss replacement. White blood cells fight against infection. Platelets control bleeding. Plasma helps clot blood. Other blood products are available for specialized needs, such as hemophilia or other clotting disorders. BEFORE THE TRANSFUSION  Who gives blood for transfusions?  You may be able to donate blood to be used at a later date on yourself (autologous donation). Relatives can be asked to donate blood. This is generally not any safer than if you have received blood from a  stranger. The same precautions are taken to ensure safety when a relative's blood is donated. Healthy volunteers who are fully evaluated to make sure their blood is safe. This is blood bank blood. Transfusion therapy is the safest it has ever been in the practice of medicine. Before blood is taken from a donor, a complete history is taken to make sure that person has no history of diseases nor engages in risky social behavior (examples are intravenous drug use or sexual activity with multiple partners). The donor's travel history is screened to minimize risk of transmitting infections, such as malaria. The donated blood is tested for signs of infectious diseases, such as HIV and hepatitis. The blood is then tested to be sure it is compatible with you in order to  minimize the chance of a transfusion reaction. If you or a relative donates blood, this is often done in anticipation of surgery and is not appropriate for emergency situations. It takes many days to process the donated blood. RISKS AND COMPLICATIONS Although transfusion therapy is very safe and saves many lives, the main dangers of transfusion include:  Getting an infectious disease. Developing a transfusion reaction. This is an allergic reaction to something in the blood you were given. Every precaution is taken to prevent this. The decision to have a blood transfusion has been considered carefully by your caregiver before blood is given. Blood is not given unless the benefits outweigh the risks.   AFTER SURGERY INSTRUCTIONS   Return to work: 4-6 weeks if applicable   Activity: 1. Be up and out of the bed during the day.  Take a nap if needed.  You may walk up steps but be careful and use the hand rail.  Stair climbing will tire you more than you think, you may need to stop part way and rest.    2. No lifting or straining for 6 weeks over 10 pounds. No pushing, pulling, straining for 6 weeks.   3. No driving for around 1 week(s).  Do not  drive if you are taking narcotic pain medicine and make sure that your reaction time has returned.    4. You can shower as soon as the next day after surgery. Shower daily.  Use your regular soap and water (not directly on the incision) and pat your incision(s) dry afterwards; don't rub.  No tub baths or submerging your body in water until cleared by your surgeon. If you have the soap that was given to you by pre-surgical testing that was used before surgery, you do not need to use it afterwards because this can irritate your incisions.    5. No sexual activity and nothing in the vagina for 10-12 weeks.   6. You may experience a small amount of clear drainage from your incisions, which is normal.  If the drainage persists, increases, or changes color please call the office.   7. Do not use creams, lotions, or ointments such as neosporin on your incisions after surgery until advised by your surgeon because they can cause removal of the dermabond glue on your incisions.     8. You may experience vaginal spotting after surgery or around the 6-8 week mark from surgery when the stitches at the top of the vagina begin to dissolve.  The spotting is normal but if you experience heavy bleeding, call our office.   9. Take Tylenol or ibuprofen first for pain if you are able to take these medications and only use narcotic pain medication for severe pain not relieved by the Tylenol or Ibuprofen.  Monitor your Tylenol intake to a max of 4,000 mg in a 24 hour period. You can alternate these medications after surgery.   Diet: 1. Low sodium Heart Healthy Diet is recommended but you are cleared to resume your normal (before surgery) diet after your procedure.   2. It is safe to use a laxative, such as Miralax or Colace, if you have difficulty moving your bowels. You have been prescribed Sennakot-S to take at bedtime every evening after surgery to keep bowel movements regular and to prevent constipation.     Wound  Care: 1. Keep clean and dry.  Shower daily.   Reasons to call the Doctor: Fever - Oral temperature greater than 100.4 degrees Fahrenheit  Foul-smelling vaginal discharge Difficulty urinating Nausea and vomiting Increased pain at the site of the incision that is unrelieved with pain medicine. Difficulty breathing with or without chest pain New calf pain especially if only on one side Sudden, continuing increased vaginal bleeding with or without clots.   Contacts: For questions or concerns you should contact:   Dr. Jeral Pinch at [phone]   Joylene John, NP at [phone]   After Hours: call [phone] and have the GYN Oncologist paged/contacted (after 5 pm or on the weekends).   Messages sent via mychart are for non-urgent matters and are not responded to after hours so for urgent needs, please call the after hours number.

## 2022-09-22 NOTE — Progress Notes (Signed)
Patient here for new patient consultation with Dr. Berline Lopes and for a pre-operative appointment prior to her scheduled surgery on October 22, 2022 or sooner if a closer date opens. She is scheduled for robotic assisted total laparoscopic hysterectomy, bilateral salpingo-oophorectomy, sentinel lymph node biopsy, possible lymph node dissection, possible laparotomy. The surgery was discussed in detail.  See after visit summary for additional details. Visual aids used to discuss items related to surgery.     Discussed post-op pain management in detail including the aspects of the enhanced recovery pathway.  Advised her that a new prescription would be sent in for tramadol and it is only to be used for after her upcoming surgery.  We discussed the use of tylenol post-op and to monitor for a maximum of 4,000 mg in a 24 hour period.  Also prescribed sennakot to be used after surgery and to hold if having loose stools.  Discussed bowel regimen in detail.     Discussed the use of SCDs and measures to take at home to prevent DVT including frequent mobility.  Reportable signs and symptoms of DVT discussed. Post-operative instructions discussed and expectations for after surgery. Incisional care discussed as well including reportable signs and symptoms including erythema, drainage, wound separation.     10 minutes spent with the patient and with preparing information.  Verbalizing understanding of material discussed. No needs or concerns voiced at the end of the visit.   Advised patient to call for any needs.  Advised that her post-operative medications had been prescribed and could be picked up at any time.    This appointment is included in the global surgical bundle as pre-operative teaching and has no charge.

## 2022-09-23 NOTE — Patient Instructions (Addendum)
SURGICAL WAITING ROOM VISITATION  Patients having surgery or a procedure may have no more than 2 support people in the waiting area - these visitors may rotate.    Children under the age of 62 must have an adult with them who is not the patient.  Due to an increase in RSV and influenza rates and associated hospitalizations, children ages 7 and under may not visit patients in [place].  If the patient needs to stay at the hospital during part of their recovery, the visitor guidelines for inpatient rooms apply. Pre-op nurse will coordinate an appropriate time for 1 support person to accompany patient in pre-op.  This support person may not rotate.    Please refer to the [facility] website for the visitor guidelines for Inpatients (after your surgery is over and you are in a regular room).    Your procedure is scheduled on: 09/25/22   Report to [facility] Main Entrance    Report to admitting at 7:45 AM   Call this number if you have problems the morning of surgery [phone]   Do not eat food :After Midnight.   After Midnight you may have the following liquids until 7:00 AM DAY OF SURGERY  Water Non-Citrus Juices (without pulp, NO RED-Apple, White grape, White cranberry) Black Coffee (NO MILK/CREAM OR CREAMERS, sugar ok)  Clear Tea (NO MILK/CREAM OR CREAMERS, sugar ok) regular and decaf                             Plain Jell-O (NO RED)                                           Fruit ices (not with fruit pulp, NO RED)                                     Popsicles (NO RED)                                                               Sports drinks like Gatorade (NO RED)                      If you have questions, please contact your surgeon's office.   FOLLOW BOWEL PREP AND ANY ADDITIONAL PRE OP INSTRUCTIONS YOU RECEIVED FROM YOUR SURGEON'S OFFICE!!!     Oral Hygiene is also important to reduce your risk of infection.                                     Remember - BRUSH YOUR TEETH THE MORNING OF SURGERY WITH YOUR REGULAR TOOTHPASTE  DENTURES WILL BE REMOVED PRIOR TO SURGERY PLEASE DO NOT APPLY "Poly grip" OR ADHESIVES!!!   Take these medicines the morning of surgery with A SIP OF WATER: None  You may not have any metal on your body including hair pins, jewelry, and body piercing             Do not wear make-up, lotions, powders, perfumes, or deodorant  Do not wear nail polish including gel and S&S, artificial/acrylic nails, or any other type of covering on natural nails including finger and toenails. If you have artificial nails, gel coating, etc. that needs to be removed by a nail salon please have this removed prior to surgery or surgery may need to be canceled/ delayed if the surgeon/ anesthesia feels like they are unable to be safely monitored.   Do not shave  48 hours prior to surgery.    Do not bring valuables to the hospital. [place].   Contacts, glasses, dentures or bridgework may not be worn into surgery.  DO NOT [place]. PHARMACY WILL DISPENSE MEDICATIONS LISTED ON YOUR MEDICATION LIST TO YOU DURING YOUR ADMISSION [place]!    Patients discharged on the day of surgery will not be allowed to drive home.  Someone NEEDS to stay with you for the first 24 hours after anesthesia.              Please read over the following fact sheets you were given: IF [place] [phone] Schneiders    If you received a COVID test during your pre-op visit  it is requested that you wear a mask when out in public, stay away from anyone that may not be feeling well and notify your surgeon if you develop symptoms. If you test positive for Covid or have been in contact with anyone that has tested positive in the last 10 days please notify you surgeon.    [place] - Preparing  for Surgery Before surgery, you can play an important role.  Because skin is not sterile, your skin needs to be as free of germs as possible.  You can reduce the number of germs on your skin by washing with CHG (chlorahexidine gluconate) soap before surgery.  CHG is an antiseptic cleaner which kills germs and bonds with the skin to continue killing germs even after washing. Please DO NOT use if you have an allergy to CHG or antibacterial soaps.  If your skin becomes reddened/irritated stop using the CHG and inform your nurse when you arrive at Short Stay. Do not shave (including legs and underarms) for at least 48 hours prior to the first CHG shower.  You may shave your face/neck.  Please follow these instructions carefully:  1.  Shower with CHG Soap the night before surgery and the  morning of surgery.  2.  If you choose to wash your hair, wash your hair first as usual with your normal  shampoo.  3.  After you shampoo, rinse your hair and body thoroughly to remove the shampoo.                             4.  Use CHG as you would any other liquid soap.  You can apply chg directly to the skin and wash.  Gently with a scrungie or clean washcloth.  5.  Apply the CHG Soap to your body ONLY FROM THE NECK DOWN.   Do   not use on  face/ open                           Wound or open sores. Avoid contact with eyes, ears mouth and   genitals (private parts).                       Wash face,  Genitals (private parts) with your normal soap.             6.  Wash thoroughly, paying special attention to the area where your    surgery  will be performed.  7.  Thoroughly rinse your body with warm water from the neck down.  8.  DO NOT shower/wash with your normal soap after using and rinsing off the CHG Soap.                9.  Pat yourself dry with a clean towel.            10.  Wear clean pajamas.            11.  Place clean sheets on your bed the night of your first shower and do not  sleep with pets. Day of  Surgery : Do not apply any lotions/deodorants the morning of surgery.  Please wear clean clothes to the hospital/surgery center.  FAILURE TO FOLLOW THESE INSTRUCTIONS MAY RESULT IN THE CANCELLATION OF YOUR SURGERY  PATIENT SIGNATURE_________________________________  NURSE SIGNATURE__________________________________  ________________________________________________________________________  Terri Gamble  An incentive spirometer is a tool that can help keep your lungs clear and active. This tool measures how well you are filling your lungs with each breath. Taking long deep breaths may help reverse or decrease the chance of developing breathing (pulmonary) problems (especially infection) following: A long period of time when you are unable to move or be active. BEFORE THE PROCEDURE  If the spirometer includes an indicator to show your best effort, your nurse or respiratory therapist will set it to a desired goal. If possible, sit up straight or lean slightly forward. Try not to slouch. Hold the incentive spirometer in an upright position. INSTRUCTIONS FOR USE  Sit on the edge of your bed if possible, or sit up as far as you can in bed or on a chair. Hold the incentive spirometer in an upright position. Breathe out normally. Place the mouthpiece in your mouth and seal your lips tightly around it. Breathe in slowly and as deeply as possible, raising the piston or the ball toward the top of the column. Hold your breath for 3-5 seconds or for as long as possible. Allow the piston or ball to fall to the bottom of the column. Remove the mouthpiece from your mouth and breathe out normally. Rest for a few seconds and repeat Steps 1 through 7 at least 10 times every 1-2 hours when you are awake. Take your time and take a few normal breaths between deep breaths. The spirometer may include an indicator to show your best effort. Use the indicator as a goal to work toward during each  repetition. After each set of 10 deep breaths, practice coughing to be sure your lungs are clear. If you have an incision (the cut made at the time of surgery), support your incision when coughing by placing a pillow or rolled up towels firmly against it. Once you are able to get out of bed, walk around indoors and cough well. You may stop using the incentive  spirometer when instructed by your caregiver.  RISKS AND COMPLICATIONS Take your time so you do not get dizzy or light-headed. If you are in pain, you may need to take or ask for pain medication before doing incentive spirometry. It is harder to take a deep breath if you are having pain. AFTER USE Rest and breathe slowly and easily. It can be helpful to keep track of a log of your progress. Your caregiver can provide you with a simple table to help with this. If you are using the spirometer at home, follow these instructions: [place] IF:  You are having difficultly using the spirometer. You have trouble using the spirometer as often as instructed. Your pain medication is not giving enough relief while using the spirometer. You develop fever of 100.5 F (38.1 C) or higher. SEEK IMMEDIATE MEDICAL CARE IF:  You cough up bloody sputum that had not been present before. You develop fever of 102 F (38.9 C) or greater. You develop worsening pain at or near the incision site. MAKE SURE YOU:  Understand these instructions. Will watch your condition. Will get help right away if you are not doing well or get worse. Document Released: 01/05/2007 Document Revised: 11/17/2011 Document Reviewed: 03/08/2007 ExitCare Patient Information 2014 Exit[employer], [place].   ________________________________________________________________________ WHAT IS A BLOOD TRANSFUSION? Blood Transfusion Information  A transfusion is the replacement of blood or some of its parts. Blood is made up of multiple cells which provide different functions. Red blood  cells carry oxygen and are used for blood loss replacement. White blood cells fight against infection. Platelets control bleeding. Plasma helps clot blood. Other blood products are available for specialized needs, such as hemophilia or other clotting disorders. BEFORE THE TRANSFUSION  Who gives blood for transfusions?  Healthy volunteers who are fully evaluated to make sure their blood is safe. This is blood bank blood. Transfusion therapy is the safest it has ever been in the practice of medicine. Before blood is taken from a donor, a complete history is taken to make sure that person has no history of diseases nor engages in risky social behavior (examples are intravenous drug use or sexual activity with multiple partners). The donor's travel history is screened to minimize risk of transmitting infections, such as malaria. The donated blood is tested for signs of infectious diseases, such as HIV and hepatitis. The blood is then tested to be sure it is compatible with you in order to minimize the chance of a transfusion reaction. If you or a relative donates blood, this is often done in anticipation of surgery and is not appropriate for emergency situations. It takes many days to process the donated blood. RISKS AND COMPLICATIONS Although transfusion therapy is very safe and saves many lives, the main dangers of transfusion include:  Getting an infectious disease. Developing a transfusion reaction. This is an allergic reaction to something in the blood you were given. Every precaution is taken to prevent this. The decision to have a blood transfusion has been considered carefully by your caregiver before blood is given. Blood is not given unless the benefits outweigh the risks. AFTER THE TRANSFUSION Right after receiving a blood transfusion, you will usually feel much better and more energetic. This is especially true if your red blood cells have gotten low (anemic). The transfusion raises the level  of the red blood cells which carry oxygen, and this usually causes an energy increase. The nurse administering the transfusion will monitor you carefully for complications. HOME  CARE INSTRUCTIONS  No special instructions are needed after a transfusion. You may find your energy is better. Speak with your caregiver about any limitations on activity for underlying diseases you may have. SEEK MEDICAL CARE IF:  Your condition is not improving after your transfusion. You develop redness or irritation at the intravenous (IV) site. SEEK IMMEDIATE MEDICAL CARE IF:  Any of the following symptoms occur over the next 12 hours: Shaking chills. You have a temperature by mouth above 102 F (38.9 C), not controlled by medicine. Chest, back, or muscle pain. People around you feel you are not acting correctly or are confused. Shortness of breath or difficulty breathing. Dizziness and fainting. You get a rash or develop hives. You have a decrease in urine output. Your urine turns a dark color or changes to pink, red, or brown. Any of the following symptoms occur over the next 10 days: You have a temperature by mouth above 102 F (38.9 C), not controlled by medicine. Shortness of breath. Weakness after normal activity. The white part of the eye turns yellow (jaundice). You have a decrease in the amount of urine or are urinating less often. Your urine turns a dark color or changes to pink, red, or brown. Document Released: 08/22/2000 Document Revised: 11/17/2011 Document Reviewed: 04/10/2008 [facility] Patient Information 2014 [place], [place].  _______________________________________________________________________

## 2022-09-23 NOTE — Progress Notes (Addendum)
Patient called to inform that she may not be able to get to the hospital until 0800 DOS. Called short stay to inform.  COVID Vaccine Completed: yes  Date of COVID positive in last 90 days: no  PCP - Webb Silversmith, NP Cardiologist -   Chest x-ray - n/a EKG - 09/05/22 Epic Stress Test - n/a ECHO - yes per pt within last 10 years  Cardiac Cath - n/a Pacemaker/ICD device last checked: n/a Spinal Cord Stimulator: n/a  Bowel Prep - light diet day before  Sleep Study - n/a CPAP -   Fasting Blood Sugar - n/a Checks Blood Sugar _____ times a day  Last dose of GLP1 agonist-  N/A GLP1 instructions:  N/A   Last dose of SGLT-2 inhibitors-  N/A SGLT-2 instructions: N/A   Blood Thinner Instructions: n/a Aspirin Instructions: Last Dose:  Activity level: Can go up a flight of stairs and perform activities of daily living without stopping and without symptoms of chest pain or shortness of breath.   Anesthesia review:   Patient denies shortness of breath, fever, cough and chest pain at PAT appointment  Patient verbalized understanding of instructions that were given to them at the PAT appointment. Patient was also instructed that they will need to review over the PAT instructions again at home before surgery.

## 2022-09-24 ENCOUNTER — Encounter (HOSPITAL_COMMUNITY): Payer: Self-pay

## 2022-09-24 ENCOUNTER — Telehealth: Payer: Self-pay | Admitting: *Deleted

## 2022-09-24 ENCOUNTER — Encounter (HOSPITAL_COMMUNITY)
Admission: RE | Admit: 2022-09-24 | Discharge: 2022-09-24 | Disposition: A | Discharge status: Home / Self Care | Payer: Managed Care, Other (non HMO) | Source: Ambulatory Visit | Attending: Gynecologic Oncology | Admitting: Gynecologic Oncology

## 2022-09-24 DIAGNOSIS — C541 Malignant neoplasm of endometrium: Secondary | ICD-10-CM | POA: Insufficient documentation

## 2022-09-24 DIAGNOSIS — Z01812 Encounter for preprocedural laboratory examination: Secondary | ICD-10-CM | POA: Diagnosis present

## 2022-09-24 LAB — CBC
HCT: 45.4 % (ref 36.0–46.0)
Hemoglobin: 14.8 g/dL (ref 12.0–15.0)
MCH: 29.1 pg (ref 26.0–34.0)
MCHC: 32.6 g/dL (ref 30.0–36.0)
MCV: 89.2 fL (ref 80.0–100.0)
Platelets: 458 10*3/uL — ABNORMAL HIGH (ref 150–400)
RBC: 5.09 MIL/uL (ref 3.87–5.11)
RDW: 13 % (ref 11.5–15.5)
WBC: 9.5 10*3/uL (ref 4.0–10.5)
nRBC: 0 % (ref 0.0–0.2)

## 2022-09-24 LAB — COMPREHENSIVE METABOLIC PANEL
ALT: 20 U/L (ref 0–44)
AST: 17 U/L (ref 15–41)
Albumin: 3.9 g/dL (ref 3.5–5.0)
Alkaline Phosphatase: 89 U/L (ref 38–126)
Anion gap: 9 (ref 5–15)
BUN: 24 mg/dL — ABNORMAL HIGH (ref 8–23)
CO2: 25 mmol/L (ref 22–32)
Calcium: 10.1 mg/dL (ref 8.9–10.3)
Chloride: 103 mmol/L (ref 98–111)
Creatinine, Ser: 1.06 mg/dL — ABNORMAL HIGH (ref 0.44–1.00)
GFR, Estimated: 59 mL/min — ABNORMAL LOW (ref >60)
Glucose, Bld: 93 mg/dL (ref 70–99)
Potassium: 4.6 mmol/L (ref 3.5–5.1)
Sodium: 137 mmol/L (ref 135–145)
Total Bilirubin: 0.4 mg/dL (ref 0.3–1.2)
Total Protein: 7.6 g/dL (ref 6.5–8.1)

## 2022-09-24 NOTE — Telephone Encounter (Signed)
Call to patient to review surgery instructions for procedure tomorrow. Request call back to [phone]

## 2022-09-24 NOTE — Telephone Encounter (Signed)
Telephone call to check on pre-operative status.  Patient compliant with pre-operative instructions.  Reinforced nothing to eat after midnight. Clear liquids until 0700. Patient to arrive at [place].  Advised of lab results from [facility] and instructed to focus on hydration and avoiding NSAIDS in large amounts. Patient reports likely had not been hydrated when labs were done. She has reviewed labs in My Chart. Advised will send to PCP and to follow-up with them.  No questions or concerns voiced.  Instructed to call for any needs.

## 2022-09-24 NOTE — Telephone Encounter (Signed)
Lab results faxed to [facility] [place] NP per result note.

## 2022-09-25 ENCOUNTER — Ambulatory Visit (HOSPITAL_BASED_OUTPATIENT_CLINIC_OR_DEPARTMENT_OTHER): Payer: Managed Care, Other (non HMO) | Admitting: Certified Registered Nurse Anesthetist

## 2022-09-25 ENCOUNTER — Ambulatory Visit (HOSPITAL_COMMUNITY): Payer: Managed Care, Other (non HMO) | Admitting: Certified Registered Nurse Anesthetist

## 2022-09-25 ENCOUNTER — Ambulatory Visit (HOSPITAL_COMMUNITY)
Admission: RE | Admit: 2022-09-25 | Discharge: 2022-09-26 | Disposition: A | Discharge status: Home / Self Care | Payer: Managed Care, Other (non HMO) | Attending: Gynecologic Oncology | Admitting: Gynecologic Oncology

## 2022-09-25 ENCOUNTER — Encounter (HOSPITAL_COMMUNITY)
Admission: RE | Disposition: A | Discharge status: Home / Self Care | Payer: Self-pay | Source: Home / Self Care | Attending: Gynecologic Oncology

## 2022-09-25 ENCOUNTER — Other Ambulatory Visit: Payer: Self-pay

## 2022-09-25 ENCOUNTER — Encounter (HOSPITAL_COMMUNITY): Payer: Self-pay | Admitting: Gynecologic Oncology

## 2022-09-25 DIAGNOSIS — N8003 Adenomyosis of the uterus: Secondary | ICD-10-CM | POA: Diagnosis not present

## 2022-09-25 DIAGNOSIS — Z8542 Personal history of malignant neoplasm of other parts of uterus: Secondary | ICD-10-CM

## 2022-09-25 DIAGNOSIS — Z800 Family history of malignant neoplasm of digestive organs: Secondary | ICD-10-CM | POA: Diagnosis not present

## 2022-09-25 DIAGNOSIS — Z87891 Personal history of nicotine dependence: Secondary | ICD-10-CM | POA: Diagnosis not present

## 2022-09-25 DIAGNOSIS — C541 Malignant neoplasm of endometrium: Secondary | ICD-10-CM | POA: Diagnosis not present

## 2022-09-25 DIAGNOSIS — R000 Tachycardia, unspecified: Secondary | ICD-10-CM

## 2022-09-25 DIAGNOSIS — I10 Essential (primary) hypertension: Secondary | ICD-10-CM | POA: Insufficient documentation

## 2022-09-25 DIAGNOSIS — N289 Disorder of kidney and ureter, unspecified: Secondary | ICD-10-CM

## 2022-09-25 DIAGNOSIS — K660 Peritoneal adhesions (postprocedural) (postinfection): Secondary | ICD-10-CM | POA: Diagnosis not present

## 2022-09-25 DIAGNOSIS — Z801 Family history of malignant neoplasm of trachea, bronchus and lung: Secondary | ICD-10-CM | POA: Diagnosis not present

## 2022-09-25 DIAGNOSIS — Z6841 Body Mass Index (BMI) 40.0 and over, adult: Secondary | ICD-10-CM | POA: Insufficient documentation

## 2022-09-25 DIAGNOSIS — D259 Leiomyoma of uterus, unspecified: Secondary | ICD-10-CM | POA: Diagnosis not present

## 2022-09-25 HISTORY — PX: SENTINEL NODE BIOPSY: SHX6608

## 2022-09-25 HISTORY — PX: CYSTOSCOPY: SHX5120

## 2022-09-25 HISTORY — PX: ROBOTIC ASSISTED TOTAL HYSTERECTOMY WITH BILATERAL SALPINGO OOPHERECTOMY: SHX6086

## 2022-09-25 LAB — TYPE AND SCREEN
ABO/RH(D): O POS
Antibody Screen: NEGATIVE

## 2022-09-25 LAB — HEMOGLOBIN AND HEMATOCRIT, BLOOD
HCT: 41.4 % (ref 36.0–46.0)
Hemoglobin: 13.5 g/dL (ref 12.0–15.0)

## 2022-09-25 SURGERY — HYSTERECTOMY, TOTAL, ROBOT-ASSISTED, LAPAROSCOPIC, WITH BILATERAL SALPINGO-OOPHORECTOMY
Anesthesia: General

## 2022-09-25 MED ORDER — DEXAMETHASONE SODIUM PHOSPHATE 10 MG/ML IJ SOLN
INTRAMUSCULAR | Status: DC | PRN
  Administered 2022-09-25: 5 mg via INTRAVENOUS

## 2022-09-25 MED ORDER — CEFAZOLIN SODIUM-DEXTROSE 2-4 GM/100ML-% IV SOLN
2.0000 g | INTRAVENOUS | Status: AC
  Administered 2022-09-25: 2 g via INTRAVENOUS
  Filled 2022-09-25: qty 100

## 2022-09-25 MED ORDER — FENTANYL CITRATE (PF) 100 MCG/2ML IJ SOLN
INTRAMUSCULAR | Status: AC
  Filled 2022-09-25: qty 2

## 2022-09-25 MED ORDER — DEXTROSE-NACL 5-0.45 % IV SOLN: INTRAVENOUS | Status: DC

## 2022-09-25 MED ORDER — KETAMINE HCL 10 MG/ML IJ SOLN
INTRAMUSCULAR | Status: DC | PRN
  Administered 2022-09-25: 25 mg via INTRAVENOUS

## 2022-09-25 MED ORDER — ROCURONIUM BROMIDE 10 MG/ML (PF) SYRINGE
PREFILLED_SYRINGE | INTRAVENOUS | Status: DC | PRN
  Administered 2022-09-25: 70 mg via INTRAVENOUS
  Administered 2022-09-25 (×2): 20 mg via INTRAVENOUS

## 2022-09-25 MED ORDER — FENTANYL CITRATE PF 50 MCG/ML IJ SOSY
25.0000 ug | PREFILLED_SYRINGE | INTRAMUSCULAR | Status: DC | PRN
  Administered 2022-09-25 (×3): 50 ug via INTRAVENOUS

## 2022-09-25 MED ORDER — PROMETHAZINE HCL 25 MG/ML IJ SOLN
6.2500 mg | INTRAMUSCULAR | Status: DC | PRN
  Administered 2022-09-25: 6.25 mg via INTRAVENOUS

## 2022-09-25 MED ORDER — CHLORHEXIDINE GLUCONATE 0.12 % MT SOLN
15.0000 mL | Freq: Once | OROMUCOSAL | Status: AC
  Administered 2022-09-25: 15 mL via OROMUCOSAL

## 2022-09-25 MED ORDER — GABAPENTIN 300 MG PO CAPS
300.0000 mg | ORAL_CAPSULE | ORAL | Status: AC
  Administered 2022-09-25: 300 mg via ORAL
  Filled 2022-09-25: qty 1

## 2022-09-25 MED ORDER — PROPOFOL 10 MG/ML IV BOLUS
INTRAVENOUS | Status: AC
  Filled 2022-09-25: qty 20

## 2022-09-25 MED ORDER — DEXAMETHASONE SODIUM PHOSPHATE 4 MG/ML IJ SOLN: 4.0000 mg | INTRAMUSCULAR | Status: DC

## 2022-09-25 MED ORDER — STERILE WATER FOR INJECTION IJ SOLN
INTRAMUSCULAR | Status: DC | PRN
  Administered 2022-09-25: 4 mL via INTRAMUSCULAR

## 2022-09-25 MED ORDER — ROCURONIUM BROMIDE 10 MG/ML (PF) SYRINGE
PREFILLED_SYRINGE | INTRAVENOUS | Status: AC
  Filled 2022-09-25: qty 10

## 2022-09-25 MED ORDER — PROMETHAZINE HCL 25 MG/ML IJ SOLN
INTRAMUSCULAR | Status: AC
  Filled 2022-09-25: qty 1

## 2022-09-25 MED ORDER — PHENYLEPHRINE HCL (PRESSORS) 10 MG/ML IV SOLN
INTRAVENOUS | Status: AC
  Filled 2022-09-25: qty 1

## 2022-09-25 MED ORDER — FENTANYL CITRATE (PF) 100 MCG/2ML IJ SOLN
INTRAMUSCULAR | Status: DC | PRN
  Administered 2022-09-25: 100 ug via INTRAVENOUS
  Administered 2022-09-25 (×4): 50 ug via INTRAVENOUS

## 2022-09-25 MED ORDER — MIDAZOLAM HCL 5 MG/5ML IJ SOLN
INTRAMUSCULAR | Status: DC | PRN
  Administered 2022-09-25: 2 mg via INTRAVENOUS

## 2022-09-25 MED ORDER — ORAL CARE MOUTH RINSE: 15.0000 mL | Freq: Once | OROMUCOSAL | Status: AC

## 2022-09-25 MED ORDER — PHENYLEPHRINE 80 MCG/ML (10ML) SYRINGE FOR IV PUSH (FOR BLOOD PRESSURE SUPPORT)
PREFILLED_SYRINGE | INTRAVENOUS | Status: DC | PRN
  Administered 2022-09-25 (×3): 160 ug via INTRAVENOUS
  Administered 2022-09-25: 200 ug via INTRAVENOUS

## 2022-09-25 MED ORDER — PROPOFOL 10 MG/ML IV BOLUS
INTRAVENOUS | Status: DC | PRN
  Administered 2022-09-25: 150 mg via INTRAVENOUS

## 2022-09-25 MED ORDER — ACETAMINOPHEN 500 MG PO TABS
1000.0000 mg | ORAL_TABLET | ORAL | Status: AC
  Administered 2022-09-25: 1000 mg via ORAL
  Filled 2022-09-25: qty 2

## 2022-09-25 MED ORDER — ONDANSETRON HCL 4 MG PO TABS: 4.0000 mg | ORAL_TABLET | Freq: Four times a day (QID) | ORAL | Status: DC | PRN

## 2022-09-25 MED ORDER — ONDANSETRON HCL 4 MG/2ML IJ SOLN: 4.0000 mg | Freq: Four times a day (QID) | INTRAMUSCULAR | Status: DC | PRN

## 2022-09-25 MED ORDER — ACETAMINOPHEN 500 MG PO TABS
1000.0000 mg | ORAL_TABLET | Freq: Four times a day (QID) | ORAL | Status: DC
  Administered 2022-09-25 – 2022-09-26 (×2): 1000 mg via ORAL
  Filled 2022-09-25 (×3): qty 2

## 2022-09-25 MED ORDER — MIDAZOLAM HCL 2 MG/2ML IJ SOLN
INTRAMUSCULAR | Status: AC
  Filled 2022-09-25: qty 2

## 2022-09-25 MED ORDER — BUPIVACAINE HCL 0.25 % IJ SOLN
INTRAMUSCULAR | Status: DC | PRN
  Administered 2022-09-25: 30 mL

## 2022-09-25 MED ORDER — FENTANYL CITRATE PF 50 MCG/ML IJ SOSY
PREFILLED_SYRINGE | INTRAMUSCULAR | Status: AC
  Filled 2022-09-25: qty 1

## 2022-09-25 MED ORDER — SUGAMMADEX SODIUM 500 MG/5ML IV SOLN
INTRAVENOUS | Status: DC | PRN
  Administered 2022-09-25: 250 mg via INTRAVENOUS

## 2022-09-25 MED ORDER — ONDANSETRON HCL 4 MG/2ML IJ SOLN
INTRAMUSCULAR | Status: AC
  Filled 2022-09-25: qty 2

## 2022-09-25 MED ORDER — LIDOCAINE 2% (20 MG/ML) 5 ML SYRINGE
INTRAMUSCULAR | Status: DC | PRN
  Administered 2022-09-25: 80 mg via INTRAVENOUS

## 2022-09-25 MED ORDER — PHENYLEPHRINE HCL-NACL 20-0.9 MG/250ML-% IV SOLN
INTRAVENOUS | Status: DC | PRN
  Administered 2022-09-25: 40 ug/min via INTRAVENOUS

## 2022-09-25 MED ORDER — ENOXAPARIN SODIUM 40 MG/0.4ML IJ SOSY: 40.0000 mg | PREFILLED_SYRINGE | INTRAMUSCULAR | Status: DC

## 2022-09-25 MED ORDER — LACTATED RINGERS IV SOLN: INTRAVENOUS | Status: DC | PRN

## 2022-09-25 MED ORDER — OXYCODONE HCL 5 MG PO TABS
5.0000 mg | ORAL_TABLET | ORAL | Status: DC | PRN
  Administered 2022-09-26: 5 mg via ORAL
  Filled 2022-09-25: qty 1

## 2022-09-25 MED ORDER — TRAMADOL HCL 50 MG PO TABS
ORAL_TABLET | ORAL | Status: AC
  Administered 2022-09-25: 50 mg via ORAL
  Filled 2022-09-25: qty 1

## 2022-09-25 MED ORDER — HEPARIN SODIUM (PORCINE) 5000 UNIT/ML IJ SOLN
5000.0000 [IU] | INTRAMUSCULAR | Status: AC
  Administered 2022-09-25: 5000 [IU] via SUBCUTANEOUS
  Filled 2022-09-25: qty 1

## 2022-09-25 MED ORDER — BUPIVACAINE HCL (PF) 0.25 % IJ SOLN
INTRAMUSCULAR | Status: AC
  Filled 2022-09-25: qty 30

## 2022-09-25 MED ORDER — SCOPOLAMINE 1 MG/3DAYS TD PT72
1.0000 | MEDICATED_PATCH | TRANSDERMAL | Status: DC
  Administered 2022-09-25: 1.5 mg via TRANSDERMAL
  Filled 2022-09-25: qty 1

## 2022-09-25 MED ORDER — HYDROMORPHONE HCL 1 MG/ML IJ SOLN: 0.2000 mg | INTRAMUSCULAR | Status: DC | PRN

## 2022-09-25 MED ORDER — STERILE WATER FOR IRRIGATION IR SOLN
Status: DC | PRN
  Administered 2022-09-25: 1000 mL

## 2022-09-25 MED ORDER — DEXAMETHASONE SODIUM PHOSPHATE 10 MG/ML IJ SOLN
INTRAMUSCULAR | Status: AC
  Filled 2022-09-25: qty 1

## 2022-09-25 MED ORDER — FENTANYL CITRATE PF 50 MCG/ML IJ SOSY
PREFILLED_SYRINGE | INTRAMUSCULAR | Status: AC
  Filled 2022-09-25: qty 2

## 2022-09-25 MED ORDER — CELECOXIB 200 MG PO CAPS
200.0000 mg | ORAL_CAPSULE | ORAL | Status: AC
  Administered 2022-09-25: 200 mg via ORAL
  Filled 2022-09-25: qty 1

## 2022-09-25 MED ORDER — ONDANSETRON 4 MG PO TBDP
ORAL_TABLET | ORAL | Status: AC
  Administered 2022-09-25: 4 mg
  Filled 2022-09-25: qty 1

## 2022-09-25 MED ORDER — TRAMADOL HCL 50 MG PO TABS
50.0000 mg | ORAL_TABLET | Freq: Four times a day (QID) | ORAL | Status: DC | PRN
  Filled 2022-09-25: qty 1

## 2022-09-25 MED ORDER — STERILE WATER FOR INJECTION IJ SOLN
INTRAMUSCULAR | Status: AC
  Filled 2022-09-25: qty 10

## 2022-09-25 MED ORDER — SENNOSIDES-DOCUSATE SODIUM 8.6-50 MG PO TABS
2.0000 | ORAL_TABLET | Freq: Every day | ORAL | Status: DC
  Administered 2022-09-25: 2 via ORAL
  Filled 2022-09-25: qty 2

## 2022-09-25 MED ORDER — LACTATED RINGERS IR SOLN
Status: DC | PRN
  Administered 2022-09-25: 1000 mL

## 2022-09-25 MED ORDER — HEMOSTATIC AGENTS (NO CHARGE) OPTIME
TOPICAL | Status: DC | PRN
  Administered 2022-09-25: 1 via TOPICAL

## 2022-09-25 MED ORDER — LIDOCAINE HCL (PF) 2 % IJ SOLN
INTRAMUSCULAR | Status: AC
  Filled 2022-09-25: qty 5

## 2022-09-25 MED ORDER — SUGAMMADEX SODIUM 500 MG/5ML IV SOLN
INTRAVENOUS | Status: AC
  Filled 2022-09-25: qty 5

## 2022-09-25 MED ORDER — ONDANSETRON HCL 4 MG/2ML IJ SOLN
INTRAMUSCULAR | Status: DC | PRN
  Administered 2022-09-25: 4 mg via INTRAVENOUS

## 2022-09-25 MED ORDER — TRAMADOL HCL 50 MG PO TABS
50.0000 mg | ORAL_TABLET | Freq: Four times a day (QID) | ORAL | Status: DC | PRN
  Administered 2022-09-25 – 2022-09-26 (×2): 50 mg via ORAL
  Filled 2022-09-25: qty 1

## 2022-09-25 MED ORDER — LACTATED RINGERS IV SOLN: INTRAVENOUS | Status: DC

## 2022-09-25 MED ORDER — KETAMINE HCL 50 MG/5ML IJ SOSY
PREFILLED_SYRINGE | INTRAMUSCULAR | Status: AC
  Filled 2022-09-25: qty 5

## 2022-09-25 SURGICAL SUPPLY — 85 items
ADH SKN CLS APL DERMABOND .7 (GAUZE/BANDAGES/DRESSINGS) ×2
AGENT HMST KT MTR STRL THRMB (HEMOSTASIS)
APL ESCP 34 STRL LF DISP (HEMOSTASIS)
APL SRG 38 LTWT LNG FL B (MISCELLANEOUS) ×2
APPLICATOR ARISTA FLEXITIP XL (MISCELLANEOUS) IMPLANT
APPLICATOR SURGIFLO ENDO (HEMOSTASIS) IMPLANT
BAG COUNTER SPONGE SURGICOUNT (BAG) IMPLANT
BAG LAPAROSCOPIC 12 15 PORT 16 (BASKET) IMPLANT
BAG RETRIEVAL 12/15 (BASKET) ×2
BAG SPNG CNTER NS LX DISP (BAG)
BLADE SURG SZ10 CARB STEEL (BLADE) IMPLANT
COVER BACK TABLE 60X90IN (DRAPES) ×3 IMPLANT
COVER TIP SHEARS 8 DVNC (MISCELLANEOUS) ×3 IMPLANT
COVER TIP SHEARS 8MM DA VINCI (MISCELLANEOUS) ×2
DERMABOND ADVANCED .7 DNX12 (GAUZE/BANDAGES/DRESSINGS) ×3 IMPLANT
DRAPE ARM DVNC X/XI (DISPOSABLE) ×12 IMPLANT
DRAPE COLUMN DVNC XI (DISPOSABLE) ×3 IMPLANT
DRAPE DA VINCI XI ARM (DISPOSABLE) ×8
DRAPE DA VINCI XI COLUMN (DISPOSABLE) ×2
DRAPE SHEET LG 3/4 BI-LAMINATE (DRAPES) ×3 IMPLANT
DRAPE SURG IRRIG POUCH 19X23 (DRAPES) ×3 IMPLANT
DRSG OPSITE POSTOP 4X6 (GAUZE/BANDAGES/DRESSINGS) IMPLANT
DRSG OPSITE POSTOP 4X8 (GAUZE/BANDAGES/DRESSINGS) IMPLANT
ELECT PENCIL ROCKER SW 15FT (MISCELLANEOUS) IMPLANT
ELECT REM PT RETURN 15FT ADLT (MISCELLANEOUS) ×3 IMPLANT
GAUZE 4X4 16PLY ~~LOC~~+RFID DBL (SPONGE) ×6 IMPLANT
GLOVE BIO SURGEON STRL SZ 6 (GLOVE) ×12 IMPLANT
GLOVE BIO SURGEON STRL SZ 6.5 (GLOVE) ×3 IMPLANT
GOWN STRL REUS W/ TWL LRG LVL3 (GOWN DISPOSABLE) ×12 IMPLANT
GOWN STRL REUS W/TWL LRG LVL3 (GOWN DISPOSABLE) ×8
GRASPER SUT TROCAR 14GX15 (MISCELLANEOUS) IMPLANT
HEMOSTAT ARISTA ABSORB 3G PWDR (HEMOSTASIS) IMPLANT
HOLDER FOLEY CATH W/STRAP (MISCELLANEOUS) IMPLANT
IRRIG SUCT STRYKERFLOW 2 WTIP (MISCELLANEOUS) ×2
IRRIGATION SUCT STRKRFLW 2 WTP (MISCELLANEOUS) ×3 IMPLANT
KIT PROCEDURE DA VINCI SI (MISCELLANEOUS) ×2
KIT PROCEDURE DVNC SI (MISCELLANEOUS) IMPLANT
KIT TURNOVER KIT A (KITS) IMPLANT
LIGASURE IMPACT 36 18CM CVD LR (INSTRUMENTS) IMPLANT
MANIPULATOR ADVINCU DEL 3.0 PL (MISCELLANEOUS) IMPLANT
MANIPULATOR ADVINCU DEL 3.5 PL (MISCELLANEOUS) IMPLANT
MANIPULATOR UTERINE 4.5 ZUMI (MISCELLANEOUS) IMPLANT
NDL HYPO 21X1.5 SAFETY (NEEDLE) ×3 IMPLANT
NDL SPNL 18GX3.5 QUINCKE PK (NEEDLE) IMPLANT
NEEDLE HYPO 21X1.5 SAFETY (NEEDLE) ×2 IMPLANT
NEEDLE SPNL 18GX3.5 QUINCKE PK (NEEDLE) ×2 IMPLANT
OBTURATOR OPTICAL STANDARD 8MM (TROCAR) ×2
OBTURATOR OPTICAL STND 8 DVNC (TROCAR) ×2
OBTURATOR OPTICALSTD 8 DVNC (TROCAR) ×3 IMPLANT
PACK ROBOT GYN CUSTOM WL (TRAY / TRAY PROCEDURE) ×3 IMPLANT
PAD POSITIONING PINK XL (MISCELLANEOUS) ×3 IMPLANT
PORT ACCESS TROCAR AIRSEAL 12 (TROCAR) IMPLANT
SCRUB CHG 4% DYNA-HEX 4OZ (MISCELLANEOUS) IMPLANT
SEAL CANN UNIV 5-8 DVNC XI (MISCELLANEOUS) ×9 IMPLANT
SEAL XI 5MM-8MM UNIVERSAL (MISCELLANEOUS) ×8
SET TRI-LUMEN FLTR TB AIRSEAL (TUBING) ×3 IMPLANT
SOL PREP POV-IOD 4OZ 10% (MISCELLANEOUS) ×6 IMPLANT
SPIKE FLUID TRANSFER (MISCELLANEOUS) ×3 IMPLANT
SPONGE T-LAP 18X18 ~~LOC~~+RFID (SPONGE) IMPLANT
SURGIFLO W/THROMBIN 8M KIT (HEMOSTASIS) IMPLANT
SUT MNCRL AB 4-0 PS2 18 (SUTURE) IMPLANT
SUT PDS AB 1 TP1 96 (SUTURE) IMPLANT
SUT V-LOC 180 0-0 GS22 (SUTURE) IMPLANT
SUT VIC AB 0 CT1 27 (SUTURE) ×4
SUT VIC AB 0 CT1 27XBRD ANTBC (SUTURE) IMPLANT
SUT VIC AB 2-0 CT1 27 (SUTURE)
SUT VIC AB 2-0 CT1 TAPERPNT 27 (SUTURE) IMPLANT
SUT VIC AB 2-0 CT2 27 (SUTURE) IMPLANT
SUT VIC AB 4-0 PS2 18 (SUTURE) ×6 IMPLANT
SUT VICRYL 0 27 CT2 27 ABS (SUTURE) ×3 IMPLANT
SUT VICRYL 0 UR6 27IN ABS (SUTURE) IMPLANT
SUT VLOC 180 0 9IN  GS21 (SUTURE)
SUT VLOC 180 0 9IN GS21 (SUTURE) IMPLANT
SYR 10ML LL (SYRINGE) IMPLANT
SYS BAG RETRIEVAL 10MM (BASKET)
SYS WOUND ALEXIS 18CM MED (MISCELLANEOUS)
SYSTEM BAG RETRIEVAL 10MM (BASKET) IMPLANT
SYSTEM WOUND ALEXIS 18CM MED (MISCELLANEOUS) IMPLANT
TOWEL OR NON WOVEN STRL DISP B (DISPOSABLE) IMPLANT
TRAP SPECIMEN MUCUS 40CC (MISCELLANEOUS) IMPLANT
TRAY FOLEY MTR SLVR 16FR STAT (SET/KITS/TRAYS/PACK) ×3 IMPLANT
TROCAR PORT AIRSEAL 5X120 (TROCAR) IMPLANT
UNDERPAD 30X36 HEAVY ABSORB (UNDERPADS AND DIAPERS) ×6 IMPLANT
WATER STERILE IRR 1000ML POUR (IV SOLUTION) ×3 IMPLANT
YANKAUER SUCT BULB TIP 10FT TU (MISCELLANEOUS) IMPLANT

## 2022-09-25 NOTE — Interval H&P Note (Signed)
History and Physical Interval Note:  09/25/2022 8:54 AM  Terri Gamble  has presented today for surgery, with the diagnosis of ENDOMETRIAL CANCER.  The various methods of treatment have been discussed with the patient and family. After consideration of risks, benefits and other options for treatment, the patient has consented to  Procedure(s): XI ROBOTIC ASSISTED TOTAL HYSTERECTOMY WITH BILATERAL SALPINGO OOPHORECTOMY (N/A) SENTINEL NODE BIOPSY (N/A) POSSIBLE LYMPH NODE DISSECTION (N/A) POSSIBLE LAPAROTOMY (N/A) as a surgical intervention.  The patient's history has been reviewed, patient examined, no change in status, stable for surgery.  I have reviewed the patient's chart and labs.  Questions were answered to the patient's satisfaction.     Lafonda Mosses

## 2022-09-25 NOTE — Discharge Instructions (Addendum)
AFTER SURGERY INSTRUCTIONS   Return to work: 4-6 weeks if applicable   Activity: 1. Be up and out of the bed during the day.  Take a nap if needed.  You may walk up steps but be careful and use the hand rail.  Stair climbing will tire you more than you think, you may need to stop part way and rest.    2. No lifting or straining for 6 weeks over 10 pounds. No pushing, pulling, straining for 6 weeks.   3. No driving for around 1 week(s).  Do not drive if you are taking narcotic pain medicine and make sure that your reaction time has returned.    4. You can shower as soon as the next day after surgery. Shower daily.  Use your regular soap and water (not directly on the incision) and pat your incision(s) dry afterwards; don't rub.  No tub baths or submerging your body in water until cleared by your surgeon. If you have the soap that was given to you by pre-surgical testing that was used before surgery, you do not need to use it afterwards because this can irritate your incisions.    5. No sexual activity and nothing in the vagina for 10-12 weeks.   6. You may experience a small amount of clear drainage from your incisions, which is normal.  If the drainage persists, increases, or changes color please call the office.   7. Do not use creams, lotions, or ointments such as neosporin on your incisions after surgery until advised by your surgeon because they can cause removal of the dermabond glue on your incisions.     8. You may experience vaginal spotting after surgery or around the 6-8 week mark from surgery when the stitches at the top of the vagina begin to dissolve.  The spotting is normal but if you experience heavy bleeding, call our office.   9. Take Tylenol or ibuprofen first for pain if you are able to take these medications and only use narcotic pain medication for severe pain not relieved by the Tylenol or Ibuprofen.  Monitor your Tylenol intake to a max of 4,000 mg in a 24 hour period. You  can alternate these medications after surgery.   Diet: 1. Low sodium Heart Healthy Diet is recommended but you are cleared to resume your normal (before surgery) diet after your procedure.   2. It is safe to use a laxative, such as Miralax or Colace, if you have difficulty moving your bowels. You have been prescribed Sennakot-S to take at bedtime every evening after surgery to keep bowel movements regular and to prevent constipation.     Wound Care: 1. Keep clean and dry.  Shower daily.   Reasons to call the Doctor: Fever - Oral temperature greater than 100.4 degrees Fahrenheit Foul-smelling vaginal discharge Difficulty urinating Nausea and vomiting Increased pain at the site of the incision that is unrelieved with pain medicine. Difficulty breathing with or without chest pain New calf pain especially if only on one side Sudden, continuing increased vaginal bleeding with or without clots.   Contacts: For questions or concerns you should contact:   Dr. Katherine Tucker at [phone]   Melissa Cross, NP at [phone]   After Hours: call [phone] and have the GYN Oncologist paged/contacted (after 5 pm or on the weekends).   Messages sent via mychart are for non-urgent matters and are not responded to after hours so for urgent needs, please call the after hours number. 

## 2022-09-25 NOTE — Progress Notes (Signed)
GYN ONC Progress Note  Pt reports feeling tired. Had intermittent nausea and has taken oral zofran. Reporting abdominal soreness, more in left upper quadrant. Went to bathroom earlier without issue.  Sleeping off and on. Arousable. Lungs clear. Tachycardic at HR at 113.  Plan for H&H. Plan to keep patient overnight due to pain, tachycardia post-op. Orders to be placed.

## 2022-09-25 NOTE — Anesthesia Procedure Notes (Addendum)
Procedure Name: Intubation Date/Time: 09/25/2022 9:52 AM  Performed by: West Pugh, CRNAPre-anesthesia Checklist: Patient identified, Emergency Drugs available, Suction available, Patient being monitored and Timeout performed Patient Re-evaluated:Patient Re-evaluated prior to induction Oxygen Delivery Method: Circle system utilized Preoxygenation: Pre-oxygenation with 100% oxygen Induction Type: IV induction Ventilation: Mask ventilation without difficulty Laryngoscope Size: Mac and 4 Grade View: Grade I Tube type: Oral Tube size: 7.5 mm Number of attempts: 1 Airway Equipment and Method: Stylet Placement Confirmation: ETT inserted through vocal cords under direct vision, positive ETCO2, CO2 detector and breath sounds checked- equal and bilateral Secured at: 21 cm Tube secured with: Tape Dental Injury: Teeth and Oropharynx as per pre-operative assessment

## 2022-09-25 NOTE — Transfer of Care (Signed)
Immediate Anesthesia Transfer of Care Note  Patient: Terri Gamble  Procedure(s) Performed: XI ROBOTIC ASSISTED TOTAL HYSTERECTOMY WITH BILATERAL SALPINGO OOPHORECTOMY LYSIS OF ADHESIONS SENTINEL NODE BIOPSY CYSTOSCOPY  Patient Location: PACU  Anesthesia Type:General  Level of Consciousness: drowsy and patient cooperative  Airway & Oxygen Therapy: Patient Spontanous Breathing and Patient connected to face mask oxygen  Post-op Assessment: Report given to RN and Post -op Vital signs reviewed and stable  Post vital signs: Reviewed and stable  Last Vitals:  Vitals Value Taken Time  BP 116/67 09/25/22 1242  Temp 36.3 C 09/25/22 1242  Pulse 97 09/25/22 1245  Resp 19 09/25/22 1245  SpO2 100 % 09/25/22 1245  Vitals shown include unvalidated device data.  Last Pain:  Vitals:   09/25/22 1242  TempSrc:   PainSc: 0-No pain      Patients Stated Pain Goal: 4 (25/52/58 9483)  Complications: No notable events documented.

## 2022-09-25 NOTE — Op Note (Signed)
OPERATIVE NOTE  Pre-operative Diagnosis: endometrial cancer grade 1  Post-operative Diagnosis: same, suspected endometriosis, obliteration of posterior cul de sac  Operation: Robotic-assisted laparoscopic total hysterectomy with bilateral salpingoophorectomy, SLN biopsy bilaterally, lysis of adhesions for approximately 30 minutes, cystoscopy Extreme morbid obesity requiring additional OR personnel for positioning and retraction. Obesity made retroperitoneal visualization limited and increased the complexity of the case and necessitated additional instrumentation for retraction. Obesity related complexity increased the duration of the procedure by 30 minutes.    Surgeon: Jeral Pinch MD  Assistant Surgeon: Joylene John NP  Anesthesia: GET  Urine Output: 75 cc  Operative Findings: On EUA, 8-10 cm somewhat mobile uterus. Narrow vaginal introitus. ON intra-abdominal entry, normal upper abdominal survey. Some filmy adhesions of the omentum to the anterior wall in the left pelvis. Otherwise, normal omentum. Normal small and large bowel. Uterus 10 cm, somewhat bulbous at the fundus. Papillary lesions along much of the serosa c/w either endosalpingiosis or endometriosis. Several small powder burn lesions in the pelvis. Posterior cul de sac completely obliterated with some adherence of the rectum to the rectovaginal septum just inferior to the cervix. Bilateral ovaries normal in appearance although adherent to the posterior uterus and rectal mesentery. Mapping successful to bilateral obturator SLNs. Minimal adhesions between the bladder and lower uterine segment. No obvious intra-abdominal or pelvic evidence of metastatic disease. On cystoscopy, bladder dome intact, normal efflux from bilateral ureteral orifices.   Estimated Blood Loss:  100 cc      Total IV Fluids: see I&O flowsheet         Specimens: uterus, cervix, bilateral tubes and ovaries, bilateral obturator SLNs          Complications:  None apparent; patient tolerated the procedure well.         Disposition: PACU - hemodynamically stable.  Procedure Details  The patient was seen in the Holding Room. The risks, benefits, complications, treatment options, and expected outcomes were discussed with the patient.  The patient concurred with the proposed plan, giving informed consent.  The site of surgery properly noted/marked. The patient was identified as Terri Gamble and the procedure verified as a Robotic-assisted hysterectomy with bilateral salpingo oophorectomy with SLN biopsy.   After induction of anesthesia, the patient was draped and prepped in the usual sterile manner. Patient was placed in supine position after anesthesia and draped and prepped in the usual sterile manner as follows: Her arms were tucked to her side with all appropriate precautions.  The patient was secured to the bed using padding and tape across her chest.  The patient was placed in the semi-lithotomy position in [place].  The perineum and vagina were prepped with Hibiclens. The patient's abdomen was prepped with ChloraPrep and she was draped after the prep had been allowed to dry for 3 minutes.  A Time Out was held and the above information confirmed.  The urethra was prepped with Betadine. Foley catheter was placed.  A sterile speculum was placed in the vagina.  The cervix was grasped with a single-tooth tenaculum. '2mg'$  total of ICG was injected into the cervical stroma at 2 and 9 o'clock with 1cc injected at a 1cm and 70m depth (concentration 0.'5mg'$ /ml) in all locations. The cervix was dilated with PKennon Roundsdilators.  The 3.5 Delineator manipulator with colpotomizer ring was placed without difficulty.  OG tube placement was confirmed and to suction.   Next, a 10 mm skin incision was made 1 cm below the subcostal margin in the midclavicular line.  The 5 mm Optiview port and scope was used for direct entry.  Opening pressure was under 10  mm CO2.  The abdomen was insufflated and the findings were noted as above.   At this point and all points during the procedure, the patient's intra-abdominal pressure did not exceed 15 mmHg. Next, an 8 mm skin incision was made just lateral to the right of the umbilicus and a right and left port were placed about 8 cm lateral to the robot port on the right and left side.  A fourth arm was placed on the right.  The 5 mm assist trocar was exchanged for a 10-12 mm port. All ports were placed under direct visualization.  The patient was placed in steep Trendelenburg.  Adhesions were taken down with sharp dissection. Bowel was folded away into the upper abdomen.  The robot was docked in the normal manner.  The right and left peritoneum were opened parallel to the IP ligament to open the retroperitoneal spaces bilaterally. The round ligaments were transected. The SLN mapping was performed in bilateral pelvic basins. After identifying the ureters, the para rectal and paravesical spaces were opened up entirely with careful dissection below the level of the ureters bilaterally and to the depth of the uterine artery origin in order to skeletonize the uterine "web" and ensure visualization of all parametrial channels. The para-aortic basins were carefully exposed and evaluated for isolated para-aortic SLN's. Lymphatic channels were identified travelling to the following visualized sentinel lymph node's: bilateral obturator SLNs. These SLN's were separated from their surrounding lymphatic tissue, removed and sent for permanent pathology.  The hysterectomy was started.  The ureter was again noted to be on the medial leaf of the broad ligament.  The peritoneum above the ureter was incised and stretched and the infundibulopelvic ligament was skeletonized, cauterized and cut.  Posteriorly, bilateral adnexa were mobilized free from the attachment to the posterior uterus and the mesorectum. The rectum was dissected sharply away  from the rectovaginal septum anteriorly. The posterior peritoneum was taken down to the level of the KOH ring.  The anterior peritoneum was also taken down.  The bladder flap was created to the level of the KOH ring.  The uterine artery on the right side was skeletonized, cauterized and cut in the normal manner.  A similar procedure was performed on the left.  The colpotomy was made and the uterus, cervix, bilateral ovaries and tubes were amputated and delivered through the vagina.  Pedicles were inspected and excellent hemostasis was achieved.    The colpotomy at the vaginal cuff was closed with 0 Vicryl with a figure of eight at each apex and 0 V-Loc to close the midportion of the cuff in a running manner.  Irrigation was used and excellent hemostasis was achieved. Arista was placed over raw rectovaginal septum surfaces.  Cystoscopy was then performed with findings noted above.  At this point in the procedure was completed.  Robotic instruments were removed under direct visulaization.  The robot was undocked. The fascia at the 10-12 mm port was closed with 0 Vicryl using a PMI fascial closure device under direct visualization.  The subcuticular tissue was closed with 4-0 Vicryl and the skin was closed with 4-0 Monocryl in a subcuticular manner.  Dermabond was applied.    The vagina was swabbed with bleeding from distal vaginal lacerations. These were both made hemostatic with running 2-0 Vicryl. Subsequently, minimal bleeding noted. Foley catheter was removed  All sponge, lap and needle counts  were correct x  3.   The patient was transferred to the recovery room in stable condition.  Jeral Pinch, MD

## 2022-09-25 NOTE — Anesthesia Postprocedure Evaluation (Signed)
Anesthesia Post Note  Patient: Terri Gamble  Procedure(s) Performed: XI ROBOTIC ASSISTED TOTAL HYSTERECTOMY WITH BILATERAL SALPINGO OOPHORECTOMY LYSIS OF ADHESIONS SENTINEL NODE BIOPSY CYSTOSCOPY     Patient location during evaluation: PACU Anesthesia Type: General Level of consciousness: awake and alert Pain management: pain level controlled Vital Signs Assessment: post-procedure vital signs reviewed and stable Respiratory status: spontaneous breathing, nonlabored ventilation, respiratory function stable and patient connected to nasal cannula oxygen Cardiovascular status: blood pressure returned to baseline and stable Postop Assessment: no apparent nausea or vomiting Anesthetic complications: no   No notable events documented.  Last Vitals:  Vitals:   09/25/22 1758 09/25/22 1800  BP: 139/81 132/84  Pulse:  (!) 121  Resp:    Temp:    SpO2:  96%    Last Pain:  Vitals:   09/25/22 1630  TempSrc:   PainSc: [place]

## 2022-09-25 NOTE — Anesthesia Preprocedure Evaluation (Addendum)
Anesthesia Evaluation  Patient identified by MRN, date of birth, ID band Patient awake    Reviewed: Allergy & Precautions, NPO status , Patient's Chart, lab work & pertinent test results  Airway Mallampati: II  TM Distance: >3 FB Neck ROM: Full    Dental  (+) Teeth Intact, Dental Advisory Given   Pulmonary former smoker   Pulmonary exam normal breath sounds clear to auscultation       Cardiovascular hypertension, Pt. on medications Normal cardiovascular exam Rhythm:Regular Rate:Normal     Neuro/Psych negative neurological ROS  negative psych ROS   GI/Hepatic negative GI ROS, Neg liver ROS,,,  Endo/Other    Morbid obesity  Renal/GU Renal InsufficiencyRenal disease     Musculoskeletal negative musculoskeletal ROS (+)    Abdominal   Peds  Hematology negative hematology ROS (+)   Anesthesia Other Findings Day of surgery medications reviewed with the patient.  Reproductive/Obstetrics ENDOMETRIAL CANCER                             Anesthesia Physical Anesthesia Plan  ASA: 3  Anesthesia Plan: General   Post-op Pain Management: Tylenol PO (pre-op)*   Induction: Intravenous  PONV Risk Score and Plan: 4 or greater and Scopolamine patch - Pre-op, Midazolam, Dexamethasone and Ondansetron  Airway Management Planned: Oral ETT  Additional Equipment: ClearSight  Intra-op Plan:   Post-operative Plan: Extubation in OR  Informed Consent: I have reviewed the patients History and Physical, chart, labs and discussed the procedure including the risks, benefits and alternatives for the proposed anesthesia with the patient or authorized representative who has indicated his/her understanding and acceptance.     Dental advisory given  Plan Discussed with: CRNA  Anesthesia Plan Comments:         Anesthesia Quick Evaluation

## 2022-09-26 ENCOUNTER — Encounter (HOSPITAL_COMMUNITY): Payer: Self-pay | Admitting: Gynecologic Oncology

## 2022-09-26 ENCOUNTER — Encounter: Payer: Self-pay | Admitting: Gynecologic Oncology

## 2022-09-26 DIAGNOSIS — C541 Malignant neoplasm of endometrium: Secondary | ICD-10-CM | POA: Diagnosis not present

## 2022-09-26 LAB — CBC
HCT: 37.7 % (ref 36.0–46.0)
Hemoglobin: 12.4 g/dL (ref 12.0–15.0)
MCH: 29.2 pg (ref 26.0–34.0)
MCHC: 32.9 g/dL (ref 30.0–36.0)
MCV: 88.9 fL (ref 80.0–100.0)
Platelets: 397 10*3/uL (ref 150–400)
RBC: 4.24 MIL/uL (ref 3.87–5.11)
RDW: 12.9 % (ref 11.5–15.5)
WBC: 16 10*3/uL — ABNORMAL HIGH (ref 4.0–10.5)
nRBC: 0 % (ref 0.0–0.2)

## 2022-09-26 LAB — BASIC METABOLIC PANEL
Anion gap: 8 (ref 5–15)
BUN: 22 mg/dL (ref 8–23)
CO2: 23 mmol/L (ref 22–32)
Calcium: 8.7 mg/dL — ABNORMAL LOW (ref 8.9–10.3)
Chloride: 104 mmol/L (ref 98–111)
Creatinine, Ser: 0.95 mg/dL (ref 0.44–1.00)
GFR, Estimated: >60 mL/min (ref >60)
Glucose, Bld: 116 mg/dL — ABNORMAL HIGH (ref 70–99)
Potassium: 4.5 mmol/L (ref 3.5–5.1)
Sodium: 135 mmol/L (ref 135–145)

## 2022-09-26 NOTE — Plan of Care (Signed)

## 2022-09-26 NOTE — Progress Notes (Signed)
1 Day Post-Op Procedure(s) (LRB): XI ROBOTIC ASSISTED TOTAL HYSTERECTOMY WITH BILATERAL SALPINGO OOPHORECTOMY LYSIS OF ADHESIONS (N/A) SENTINEL NODE BIOPSY (N/A) CYSTOSCOPY  Subjective: Patient reports feeling better this am. Pain is controlled, more discomfort reported at LU abdominal incisional. Voiding without difficulty, large amounts. No nausea or emesis. Passing flatus. No concerns voiced.  Objective: Vital signs in last 24 hours: Temp:  [97.4 F (36.3 C)-98.3 F (36.8 C)] 98.3 F (36.8 C) (01/19 0549) Pulse Rate:  [85-121] 85 (01/19 0549) Resp:  [13-19] 18 (01/19 0549) BP: (111-144)/(61-94) 122/76 (01/19 0549) SpO2:  [93 %-100 %] 97 % (01/19 0549) Weight:  [256 lb (116.1 kg)] 256 lb (116.1 kg) (01/18 0832) Last BM Date : 09/24/22  Intake/Output from previous day: 01/18 0701 - 01/19 0700 In: 1995 [I.V.:1895; IV Piggyback:100] Out: 176 [Urine:76; Blood:100]  Physical Examination: General: alert, cooperative, appears stated age, and no distress Resp: clear to auscultation bilaterally Cardio: regular rate and rhythm, S1, S2 normal, no murmur, click, rub or gallop GI: incision: lap sites to the abdomen intact with dermabond and abdomen soft, mildly hypoactive bowel sounds, non tender Extremities: extremities normal, atraumatic, no cyanosis or edema  Labs: WBC/Hgb/Hct/Plts:  16.0/12.4/37.7/397 (01/19 0546) BUN/Cr/glu/ALT/AST/amyl/lip:  22/0.95/--/--/--/--/-- (01/19 3235)  Assessment: 62 y.o. s/p Procedure(s): XI ROBOTIC ASSISTED TOTAL HYSTERECTOMY WITH BILATERAL SALPINGO OOPHORECTOMY LYSIS OF ADHESIONS, SENTINEL NODE BIOPSY, CYSTOSCOPY: stable Pain:  Pain is well-controlled on PRN medications.  Heme: Hgb and Hct are stable and appropriate given preop values and surgical losses.  ID: No evidence of infection, WBC 16.0 this am. Given decadron intra-op. Also received ancef.  CV: BP and HR stable. Continue to monitor while inpt.  GI:  Tolerating po: yes. No N/V.  GU:  Voiding large amounts per pt. Creatinine 0.95 this am.  FEN: No critical values on am labs  Prophylaxis: SCDs ordered but not in room, lovenox ordered.  Plan: Meeting post-op milestones Plan for discharge home this am All questions answered per Dr. Berline Lopes   LOS: 0 days    Terri Gamble 09/26/2022, 7:47 AM

## 2022-09-26 NOTE — TOC Progression Note (Signed)
Transition of Care [facility]) - Progression Note    Patient Details  Name: Terri Gamble MRN: [id number] Date of Birth: 1961-08-13  Transition of Care [facility]) CM/SW [place], RN Phone Number:[phone]  09/26/2022, 11:17 AM  Clinical Narrative:     Transition of Care (TOC) Screening Note   Patient Details  Name: Terri Gamble Date of Birth: [birth year]   Transition of Care [facility]) CM/SW Contact:    Angelita Ingles, RN Phone Number: 09/26/2022, 11:18 AM    Transition of Care Department (TOC) has reviewed patient and no TOC needs have been identified at this time. We will continue to monitor patient advancement through interdisciplinary progression rounds. If new patient transition needs arise, please place a TOC consult.          Expected Discharge Plan and Services         Expected Discharge Date: 09/26/22                                     Social Determinants of Health (SDOH) Interventions SDOH Screenings   Food Insecurity: No Food Insecurity (09/25/2022)  Housing: Low Risk  (09/25/2022)  Transportation Needs: No Transportation Needs (09/25/2022)  Utilities: Not At Risk (09/25/2022)  Alcohol Screen: Low Risk  (12/10/2021)  Depression (PHQ2-9): Low Risk  (12/10/2021)  Tobacco Use: Medium Risk (09/26/2022)    Readmission Risk Interventions     No data to display

## 2022-09-26 NOTE — Discharge Summary (Signed)
Physician Discharge Summary  Patient ID: Terri Gamble MRN: [id number] DOB/AGE: 64-Sep-1962 64 y.o.  Admit date: 09/25/2022 Discharge date: 09/26/2022  Admission Diagnoses: Endometrial cancer [facility])  Discharge Diagnoses:  Principal Problem:   Endometrial cancer [facility])   Discharged Condition:  The patient is in good condition and stable for discharge.    Hospital Course: On 09/25/2022, the patient underwent the following: Procedure(s): XI ROBOTIC ASSISTED TOTAL HYSTERECTOMY WITH BILATERAL SALPINGO OOPHORECTOMY LYSIS OF ADHESIONS, SENTINEL NODE BIOPSY, CYSTOSCOPY. Postoperatively, the patient reported increased abdominal pain, feeling fatigued and she was noted be to tachycardic in PACU. An H&H was obtained in PACU and am labs as well.  She was discharged to home on postoperative day 1 tolerating a regular diet, voiding, pain controlled, labs stable, passing flatus.   Consults: None  Significant Diagnostic Studies: Labs  Treatments: surgery: see above  Discharge Exam: Blood pressure 122/76, pulse 85, temperature 98.3 F (36.8 C), temperature source Oral, resp. rate 18, height '5\' 1"'$  (1.549 m), weight 256 lb (116.1 kg), SpO2 97 %. General: alert, cooperative, appears stated age, and no distress Resp: clear to auscultation bilaterally Cardio: regular rate and rhythm, S1, S2 normal, no murmur, click, rub or gallop GI: incision: lap sites to the abdomen intact with dermabond and abdomen soft, mildly hypoactive bowel sounds, non tender Extremities: extremities normal, atraumatic, no cyanosis or edema  Disposition: Discharge disposition: 01-Home or Self Care       Discharge Instructions     Call MD for:  difficulty breathing, headache or visual disturbances   Complete by: As directed    Call MD for:  difficulty breathing, headache or visual disturbances   Complete by: As directed    Call MD for:  extreme fatigue   Complete by: As directed    Call MD for:  extreme fatigue    Complete by: As directed    Call MD for:  hives   Complete by: As directed    Call MD for:  hives   Complete by: As directed    Call MD for:  persistant dizziness or light-headedness   Complete by: As directed    Call MD for:  persistant dizziness or light-headedness   Complete by: As directed    Call MD for:  persistant nausea and vomiting   Complete by: As directed    Call MD for:  persistant nausea and vomiting   Complete by: As directed    Call MD for:  redness, tenderness, or signs of infection (pain, swelling, redness, odor or green/yellow discharge around incision site)   Complete by: As directed    Call MD for:  redness, tenderness, or signs of infection (pain, swelling, redness, odor or green/yellow discharge around incision site)   Complete by: As directed    Call MD for:  severe uncontrolled pain   Complete by: As directed    Call MD for:  severe uncontrolled pain   Complete by: As directed    Call MD for:  temperature >100.4   Complete by: As directed    Call MD for:  temperature >100.4   Complete by: As directed    Diet - low sodium heart healthy   Complete by: As directed    Diet - low sodium heart healthy   Complete by: As directed    Driving Restrictions   Complete by: As directed    No driving for around 1 week(s).  Do not take narcotics and drive. You need to make sure your reaction  time has returned.   Driving Restrictions   Complete by: As directed    No driving for around 1 week(s).  Do not take narcotics and drive. You need to make sure your reaction time has returned.   Increase activity slowly   Complete by: As directed    Increase activity slowly   Complete by: As directed    Lifting restrictions   Complete by: As directed    No lifting greater than 10 lbs, pushing, pulling, straining for 6 weeks.   Lifting restrictions   Complete by: As directed    No lifting greater than 10 lbs, pushing, pulling, straining for 6 weeks.   Sexual Activity  Restrictions   Complete by: As directed    No sexual activity, nothing in the vagina, for 10-12 weeks.   Sexual Activity Restrictions   Complete by: As directed    No sexual activity, nothing in the vagina, for 10-12 weeks.      Allergies as of 09/26/2022       Reactions   Codeine Nausea Only   Morphine And Related Other (See Comments)   Headache   Latex Rash        Medication List     TAKE these medications    BIOTIN PO Take 2,500 mcg by mouth daily.   ibuprofen 800 MG tablet Commonly known as: ADVIL Take 1 tablet (800 mg total) by mouth every 8 (eight) hours as needed for moderate pain. For AFTER surgery only What changed: Another medication with the same name was removed. Continue taking this medication, and follow the directions you see here.   lisinopril 10 MG tablet Commonly known as: ZESTRIL Take 1 tablet by mouth daily.   ondansetron 4 MG disintegrating tablet Commonly known as: ZOFRAN-ODT Take 1 tablet (4 mg total) by mouth every 8 (eight) hours as needed for nausea or vomiting.   senna-docusate 8.6-50 MG tablet Commonly known as: Senokot-S Take 2 tablets by mouth at bedtime. For AFTER surgery, do not take if having diarrhea   traMADol 50 MG tablet Commonly known as: ULTRAM Take 1 tablet (50 mg total) by mouth every 6 (six) hours as needed for severe pain. For AFTER surgery only, do not take and drive   valACYclovir 1000 MG tablet Commonly known as: VALTREX Take 1,000 mg by mouth daily as needed (fever blisters).   VITAMIN D3 PO Take 2 capsules by mouth daily.        Follow-up Information     Lafonda Mosses, MD Follow up on 09/30/2022.   Specialty: Gynecologic Oncology Why: at 5pm will be a PHONE visit to check in and discuss path. IN PERSON visit will be on 10/31/22 at 2:45pm at the [facility]. Contact information: [address] [place] [address] [phone]                 Greater than thirty minutes were spend  for face to face discharge instructions and discharge orders/summary in EPIC.   Signed: Dorothyann Gibbs 09/26/2022, 8:23 AM

## 2022-09-29 ENCOUNTER — Telehealth: Payer: Self-pay | Admitting: *Deleted

## 2022-09-29 NOTE — Telephone Encounter (Signed)
Spoke with Ms. Zoeller this morning. She states she is eating, drinking and urinating well. She is passing flatus and has had one bowel movement. She is taking senokot once daily. Advised can increase to BID to reestablish her normal bowel activity of once daily. Encouraged her to drink plenty of water. She denies fever or chills. Incisions are dry and intact. She rates her pain 11/09/08. Her pain is controlled with Ibuprofen. Only took Tramadol one day. She reports occasional hacky cough. Discussed ambulation and deep breathing every two hours. Call back if this should increase or become productive.     Instructed to call office with any fever, chills, purulent drainage, uncontrolled pain or any other questions or concerns. Patient verbalizes understanding.   Pt aware of post op appointments as well as the office number [phone] and after hours number [phone] to call if she has any questions or concerns

## 2022-09-30 ENCOUNTER — Telehealth: Payer: Self-pay | Admitting: *Deleted

## 2022-09-30 ENCOUNTER — Inpatient Hospital Stay: Payer: Managed Care, Other (non HMO) | Admitting: Gynecologic Oncology

## 2022-09-30 NOTE — Telephone Encounter (Signed)
Per Dr Berline Lopes appt canceled due to pathology not back

## 2022-10-01 ENCOUNTER — Other Ambulatory Visit: Payer: Self-pay

## 2022-10-01 NOTE — Progress Notes (Signed)
Called patient. She is doing well postoperatively. Bowels are finally moving normally. Denies any vaginal bleeding or discharge. Tolerating PO without nausea emesis. Discussed pathology. Patient overall very happy with the news. Tumor limited to the endometrium, no metastatic disease.  Valarie Cones MD

## 2022-10-02 LAB — SURGICAL PATHOLOGY

## 2022-10-29 ENCOUNTER — Telehealth: Payer: Managed Care, Other (non HMO) | Admitting: Gynecologic Oncology

## 2022-10-29 ENCOUNTER — Telehealth: Payer: Self-pay

## 2022-10-29 NOTE — Telephone Encounter (Signed)
Per Dr.Tucker, ok to see Terri Gamble on a day Dr. Berline Lopes in clinic. Pt rescheduled to 3/28 @ 3:00/ Pt agrees to date/time

## 2022-10-29 NOTE — Telephone Encounter (Signed)
Pt called stating she needs to reschedule her post-op appointment for Friday 2/23 d/t she is not feeling well. I will call pt back with a new date and time once I check with Dr. Berline Lopes.

## 2022-10-31 ENCOUNTER — Inpatient Hospital Stay: Payer: Managed Care, Other (non HMO) | Admitting: Gynecologic Oncology

## 2022-11-07 ENCOUNTER — Ambulatory Visit: Payer: Self-pay

## 2022-11-07 NOTE — Telephone Encounter (Signed)
We had appointments available in office.  She could have been seen here.

## 2022-11-07 NOTE — Telephone Encounter (Signed)
  Chief Complaint: mod to severe RUQ abd pain Symptoms: severe back pain, lightheadedness (dizziness and nausea (pt attributed to taking "old "pian med form previous surgery Frequency: Wednesday Pertinent Negatives: Patient denies diarrhea, fever, urination pain vomiting Disposition: '[x]'$ ED /'[]'$ Urgent Care (no appt availability in office) / '[]'$ Appointment(In office/virtual)/ '[]'$  [facility]/ '[]'$ Home Care/ '[]'$ Refused Recommended Disposition /'[]'$ [facility]/ '[]'$  Follow-up with PCP Additional Notes: to ED Reason for Disposition  [1] SEVERE pain (e.g., excruciating) AND [2] present > 1 hour  Answer Assessment - Initial Assessment Questions 1. LOCATION: "Where does it hurt?"      Right upper abd pain  2. RADIATION: "Does the pain shoot anywhere else?" (e.g., chest, back)     Back  3. ONSET: "When did the pain begin?" (e.g., minutes, hours or days ago)      Wednesday 4. SUDDEN: "Gradual or sudden onset?"     sudden 5. PATTERN "Does the pain come and go, or is it constant?"    - If it comes and goes: "How long does it last?" "Do you have pain now?"     (Note: Comes and goes means the pain is intermittent. It goes away completely between bouts.)    - If constant: "Is it getting better, staying the same, or getting worse?"      (Note: Constant means the pain never goes away completely; most serious pain is constant and gets worse.)      constant 6. SEVERITY: "How bad is the pain?"  (e.g., Scale 1-10; mild, moderate, or severe)    - MILD (1-3): Doesn't interfere with normal activities, abdomen soft and not tender to touch.     - MODERATE (4-7): Interferes with normal activities or awakens from sleep, abdomen tender to touch.     - SEVERE (8-10): Excruciating pain, doubled over, unable to do any normal activities.       Varies moderate to severe 7. RECURRENT SYMPTOM: "Have you ever had this type of stomach pain before?" If Yes, ask: "When was the last time?" and "What happened that  time?"      Yes few years pancreatitis later dx was r/o  8. CAUSE: "What do you think is causing the stomach pain?"     Unsure  9. RELIEVING/AGGRAVATING FACTORS: "What makes it better or worse?" (e.g., antacids, bending or twisting motion, bowel movement)     *No Answer* 10. OTHER SYMPTOMS: "Do you have any other symptoms?" (e.g., back pain, diarrhea, fever, urination pain, vomiting)       Back pain took old pain med now lightheaded and nausea  Protocols used: Abdominal Pain - Female-A-AH

## 2022-11-07 NOTE — Telephone Encounter (Signed)
Called patient back and offered her an appointment today at 4:00.  She said she would not be able to make the 4:00 and said she was feeing better.  Still c/o headache and some nausea.

## 2022-11-17 ENCOUNTER — Inpatient Hospital Stay: Payer: Managed Care, Other (non HMO)

## 2022-11-17 ENCOUNTER — Inpatient Hospital Stay: Payer: Managed Care, Other (non HMO) | Attending: Gynecologic Oncology | Admitting: Licensed Clinical Social Worker

## 2022-11-17 ENCOUNTER — Other Ambulatory Visit: Payer: Self-pay | Admitting: Licensed Clinical Social Worker

## 2022-11-17 ENCOUNTER — Other Ambulatory Visit: Payer: Self-pay

## 2022-11-17 ENCOUNTER — Encounter: Payer: Self-pay | Admitting: Licensed Clinical Social Worker

## 2022-11-17 DIAGNOSIS — Z801 Family history of malignant neoplasm of trachea, bronchus and lung: Secondary | ICD-10-CM

## 2022-11-17 DIAGNOSIS — Z800 Family history of malignant neoplasm of digestive organs: Secondary | ICD-10-CM | POA: Diagnosis not present

## 2022-11-17 DIAGNOSIS — C541 Malignant neoplasm of endometrium: Secondary | ICD-10-CM

## 2022-11-17 LAB — GENETIC SCREENING ORDER

## 2022-11-17 NOTE — Progress Notes (Signed)
REFERRING PROVIDER: Lafonda Mosses, MD [place],  [place] [address]  PRIMARY PROVIDER:  Jearld Fenton, NP  PRIMARY REASON FOR VISIT:  1. Endometrial cancer ([place])   2. Family history of colon cancer   3. Family history of pancreatic cancer   4. Family history of stomach cancer   5. Family history of lung cancer      HISTORY OF PRESENT ILLNESS:   Terri Gamble, a 62 y.o. female, was seen for a [place] cancer genetics consultation at the request of Dr. Berline Lopes due to a personal and family history of cancer.  Terri Gamble presents to clinic today to discuss the possibility of a hereditary predisposition to cancer, genetic testing, and to further clarify her future cancer risks, as well as potential cancer risks for family members.    CANCER HISTORY:  In 2024, at the age of 66, Terri Gamble was diagnosed with endometrial cancer. The treatment plan included TAH-BSO. Tumor testing showed MMR intact and MSI stable.   Oncology History Overview Note  P53 wildtype   Endometrial cancer ([place])  09/25/2022 Initial Diagnosis   Endometrial cancer ([place])    RISK FACTORS:  Menarche was at age 64-11.  First live birth at age 35.  Ovaries intact: no.  Hysterectomy: yes.  Menopausal status: postmenopausal.  HRT use: 0 years. Colonoscopy: yes; normal. Mammogram within the last year: yes. Number of breast biopsies: 0. Up to date with pelvic exams: yes.  Past Medical History:  Diagnosis Date   Complication of anesthesia    per pt with d&c hysteroscopy done at [facility] 07-03-2017 felt dizzy, headaches, and just did not feel well   Diverticulosis of colon    Endometrial polyp    History of URI (upper respiratory infection) 08/25/2022   recent dx acute bacterial sinusitis and acute cough by pcp note in epic 08-25-2022 per pt had sympstoms for 1 wk prior to dx ,  pt stated completed antibiotic and prednisone on 12/ 25/ 2023   HSV infection    oral symptoms, uses valtrex for  symptoms   Hypertension    Menorrhagia    Seasonal allergies    Thrombocytosis    Uterine fibroid     Past Surgical History:  Procedure Laterality Date   CESAREAN SECTION  1977, 1984   COLONOSCOPY WITH PROPOFOL  06/05/2015   dr stark   CYSTOSCOPY  09/25/2022   Procedure: CYSTOSCOPY;  Surgeon: Lafonda Mosses, MD;  Location: [facility];  Service: Gynecology;;   [place] N/A 07/03/2017   Procedure: DILATATION & CURETTAGE/HYSTEROSCOPY WITH MYOSURE;  Surgeon: Paula Compton, MD;  Location: [facility];  Service: Gynecology;  Laterality: N/A;  MD request 30 extra minutes   [place] N/A 09/05/2022   Procedure: DILATATION & CURETTAGE/HYSTEROSCOPY WITH MYOSURE;  Surgeon: Paula Compton, MD;  Location: [facility];  Service: Gynecology;  Laterality: N/A;   HYSTEROSCOPY WITH D & C  09/04/2004   '@[facility]'$  by dr Marvel Plan   INTRAUTERINE DEVICE (IUD) INSERTION N/A 09/05/2022   Procedure: INTRAUTERINE DEVICE (IUD) INSERTION;  Surgeon: Paula Compton, MD;  Location: [facility];  Service: Gynecology;  Laterality: N/A;   POLYPECTOMY  09/05/2022   ROBOTIC ASSISTED TOTAL HYSTERECTOMY WITH BILATERAL SALPINGO OOPHERECTOMY N/A 09/25/2022   Procedure: XI ROBOTIC ASSISTED TOTAL HYSTERECTOMY WITH BILATERAL SALPINGO OOPHORECTOMY LYSIS OF ADHESIONS;  Surgeon: Lafonda Mosses, MD;  Location: [facility];  Service: Gynecology;  Laterality: N/A;   SENTINEL NODE  BIOPSY N/A 09/25/2022   Procedure: SENTINEL NODE BIOPSY;  Surgeon: Lafonda Mosses, MD;  Location: [facility];  Service: Gynecology;  Laterality: N/A;    FAMILY HISTORY:  We obtained a detailed, 4-generation family history.  Significant diagnoses are listed below: Family History  Problem Relation Age of Onset   Heart disease Mother    Kidney disease Mother    Hypertension Mother    Diabetes Mother    Colon cancer Brother 68   Pancreatic cancer  Maternal Aunt        d. 59s   Lung cancer Maternal Uncle        d. 55s   Stomach cancer Paternal Uncle        d. >50   Liver cancer Paternal Uncle        d.>50   Lung cancer Paternal Uncle        d.>50   Diabetes Maternal Grandmother    Hypertension Maternal Grandmother    Heart disease Maternal Grandmother    Stroke Maternal Grandmother    Heart disease Maternal Grandfather    Cardiomyopathy Maternal Grandfather    Heart disease Paternal Grandmother    Stroke Paternal Grandmother    Hypertension Paternal Grandmother    Alzheimer's disease Paternal Grandfather    Colon cancer Cousin        double first cousin - d. late 63s   Colon cancer Cousin    Lung cancer Cousin    Esophageal cancer Neg Hx    Liver disease Neg Hx    Ovarian cancer Neg Hx    Breast cancer Neg Hx    Terri Gamble has 2 sons, 76 and 31. She had 3 brothers. One brother passed of colon cancer at 13, he was diagnosed around age 81.   Terri Gamble mother is living at 34. Patient has 1 maternal uncle who died of lung cancer in his 38s. A maternal aunt died of pancreatic cancer in her 66s. Maternal cousin died of lung cancer at 79, and another cousin died at 58 of leukemia. Patient has a double first cousin (her mother's sister married her father's brother) who died of colon cancer in his late 73s.   Terri Gamble father died at 31 in an accident. A paternal uncle died of stomach cancer over age 64. Another uncle had liver cancer and died over age 54. Another uncle had lung cancer and died over age 66. A paternal cousin had colon cancer and she died in her late 27s.  Terri Gamble is unaware of previous family history of genetic testing for hereditary cancer risks. There is no reported Ashkenazi Jewish ancestry. There is no known consanguinity.    GENETIC COUNSELING ASSESSMENT: Terri Gamble is a 62 y.o. female with a personal and family history of cancer which is somewhat suggestive of a hereditary cancer  syndrome and predisposition to cancer. We, therefore, discussed and recommended the following at today's visit.   DISCUSSION: We discussed that approximately 10% of cancer is hereditary. Most cases of hereditary endometrial/colon cancer are associated with Lynch genes, although there are other genes associated with hereditary cancer as well. Cancers and risks are gene specific. We discussed that testing is beneficial for several reasons including knowing about cancer risks, identifying potential screening and risk-reduction options that may be appropriate, and to understand if other family members could be at risk for cancer and allow them to undergo genetic testing.   We reviewed the characteristics, features and inheritance patterns of hereditary cancer syndromes.  We also discussed genetic testing, including the appropriate family members to test, the process of testing, insurance coverage and turn-around-time for results. We discussed the implications of a negative, positive and/or variant of uncertain significant result. We recommended Terri Gamble pursue genetic testing for the [facility] Multi-Cancer+RNA gene panel.   Based on Terri Gamble's personal and family history of cancer, she meets medical criteria for genetic testing. Despite that she meets criteria, she may still have an out of pocket cost. We discussed that if her out of pocket cost for testing is over $100, the laboratory will call and confirm whether she wants to proceed with testing.  If the out of pocket cost of testing is less than $100 she will be billed by the genetic testing laboratory.   PLAN: After considering the risks, benefits, and limitations, Terri Gamble provided informed consent to pursue genetic testing and the blood sample was sent to [facility] for analysis of the Multi-Cancer+RNA panel. Results should be available within approximately 2-3 weeks' time, at which point they will be disclosed by telephone to Ms.  Gamble, as will any additional recommendations warranted by these results. Terri Gamble will receive a summary of her genetic counseling visit and a copy of her results once available. This information will also be available in Epic.   Terri Gamble questions were answered to her satisfaction today. Our contact information was provided should additional questions or concerns arise. Thank you for the referral and allowing [place] to share in the care of your patient.   Faith Rogue, MS, [facility] Genetic Counselor [email or web address] Phone: [phone]  The patient was seen for a total of 30 minutes in face-to-face genetic counseling.  Dr. Grayland Ormond was available for discussion regarding this case.   _______________________________________________________________________ For Office Staff:  Number of people involved in session: 1 Was an Intern/ student involved with case: no

## 2022-11-20 ENCOUNTER — Encounter: Payer: Managed Care, Other (non HMO) | Admitting: Gynecologic Oncology

## 2022-11-26 ENCOUNTER — Ambulatory Visit: Payer: Self-pay | Admitting: Licensed Clinical Social Worker

## 2022-11-26 ENCOUNTER — Encounter: Payer: Self-pay | Admitting: Licensed Clinical Social Worker

## 2022-11-26 ENCOUNTER — Telehealth: Payer: Self-pay | Admitting: Licensed Clinical Social Worker

## 2022-11-26 DIAGNOSIS — Z1379 Encounter for other screening for genetic and chromosomal anomalies: Secondary | ICD-10-CM | POA: Insufficient documentation

## 2022-11-26 NOTE — Progress Notes (Signed)
HPI:   Terri Gamble was previously seen in the [place] clinic due to a personal and family history of cancer and concerns regarding a hereditary predisposition to cancer. Please refer to our prior cancer genetics clinic note for more information regarding our discussion, assessment and recommendations, at the time. Ms. Berish recent genetic test results were disclosed to her, as were recommendations warranted by these results. These results and recommendations are discussed in more detail below.  CANCER HISTORY:  Oncology History Overview Note  P53 wildtype   Endometrial cancer ([place])  09/25/2022 Initial Diagnosis   Endometrial cancer ([place])     FAMILY HISTORY:  We obtained a detailed, 4-generation family history.  Significant diagnoses are listed below: Family History  Problem Relation Age of Onset   Heart disease Mother    Kidney disease Mother    Hypertension Mother    Diabetes Mother    Colon cancer Brother 79   Pancreatic cancer Maternal Aunt        d. 30s   Lung cancer Maternal Uncle        d. 58s   Stomach cancer Paternal Uncle        d. >50   Liver cancer Paternal Uncle        d.>50   Lung cancer Paternal Uncle        d.>50   Diabetes Maternal Grandmother    Hypertension Maternal Grandmother    Heart disease Maternal Grandmother    Stroke Maternal Grandmother    Heart disease Maternal Grandfather    Cardiomyopathy Maternal Grandfather    Heart disease Paternal Grandmother    Stroke Paternal Grandmother    Hypertension Paternal Grandmother    Alzheimer's disease Paternal Grandfather    Colon cancer Cousin        double first cousin - d. late 82s   Colon cancer Cousin    Lung cancer Cousin    Esophageal cancer Neg Hx    Liver disease Neg Hx    Ovarian cancer Neg Hx    Breast cancer Neg Hx    Ms. Salak has 2 sons, 56 and 62. She had 3 brothers. One brother passed of colon cancer at 81, he was diagnosed around age 76.    Ms.  Ener mother is living at 25. Patient has 1 maternal uncle who died of lung cancer in his 2s. A maternal aunt died of pancreatic cancer in her 51s. Maternal cousin died of lung cancer at 8, and another cousin died at 36 of leukemia. Patient has a double first cousin (her mother's sister married her father's brother) who died of colon cancer in his late 77s.    Ms. Kuske father died at 98 in an accident. A paternal uncle died of stomach cancer over age 31. Another uncle had liver cancer and died over age 38. Another uncle had lung cancer and died over age 40. A paternal cousin had colon cancer and she died in her late 56s.   Ms. Ara is unaware of previous family history of genetic testing for hereditary cancer risks. There is no reported Ashkenazi Jewish ancestry. There is no known consanguinity.      GENETIC TEST RESULTS:  The [facility] Multi-Cancer+RNA Panel found no pathogenic mutations.   The Multi-Cancer + RNA Panel offered by [facility] includes sequencing and/or deletion/duplication analysis of the following 70 genes:  AIP*, ALK, APC*, ATM*, AXIN2*, BAP1*, BARD1*, BLM*, BMPR1A*, BRCA1*, BRCA2*, BRIP1*, CDC73*, CDH1*, CDK4, CDKN1B*, CDKN2A, CHEK2*, CTNNA1*, DICER1*, EPCAM,  EGFR, FH*, FLCN*, GREM1, HOXB13, KIT, LZTR1, MAX*, MBD4, MEN1*, MET, MITF, MLH1*, MSH2*, MSH3*, MSH6*, MUTYH*, NF1*, NF2*, NTHL1*, PALB2*, PDGFRA, PMS2*, POLD1*, POLE*, POT1*, PRKAR1A*, PTCH1*, PTEN*, RAD51C*, RAD51D*, RB1*, RET, SDHA*, SDHAF2*, SDHB*, SDHC*, SDHD*, SMAD4*, SMARCA4*, SMARCB1*, SMARCE1*, STK11*, SUFU*, TMEM127*, TP53*, TSC1*, TSC2*, VHL*. RNA analysis is performed for * genes.   The test report has been scanned into EPIC and is located under the Molecular Pathology section of the Results Review tab.  A portion of the result report is included below for reference. Genetic testing reported out on 11/25/2022.      Genetic testing identified a variant of uncertain significance (VUS) in the BARD1  and BLM genes.  At this time, it is unknown if this variant is associated with an increased risk for cancer or if it is benign, but most uncertain variants are reclassified to benign. It should not be used to make medical management decisions. With time, we suspect the laboratory will determine the significance of this variant, if any. If the laboratory reclassifies this variant, we will attempt to contact Ms. Thompson to discuss it further.   Even though a pathogenic variant was not identified, possible explanations for the cancer in the family may include: There may be no hereditary risk for cancer in the family. The cancers in Ms. Rake and/or her family may be sporadic/familial or due to other genetic and environmental factors. There may be a gene mutation in one of these genes that current testing methods cannot detect but that chance is small. There could be another gene that has not yet been discovered, or that we have not yet tested, that is responsible for the cancer diagnoses in the family.  It is also possible there is a hereditary cause for the cancer in the family that Ms. Klinck did not inherit. The variant of uncertain significance detected in the BARD1 or BLM gene may be reclassified as a pathogenic variant in the future. At this time, we do not know if this variant increases the risk for cancer.  Therefore, it is important to remain in touch with cancer genetics in the future so that we can continue to offer Ms. Devinney the most up to date genetic testing.   ADDITIONAL GENETIC TESTING:  We discussed with Ms. Rank that her genetic testing was fairly extensive.  If there are additional relevant genes identified to increase cancer risk that can be analyzed in the future, we would be happy to discuss and coordinate this testing at that time.    CANCER SCREENING RECOMMENDATIONS:  Ms. Tomassetti test result is considered negative (normal).  This means that we have not  identified a hereditary cause for her personal and family history of cancer at this time.   An individual's cancer risk and medical management are not determined by genetic test results alone. Overall cancer risk assessment incorporates additional factors, including personal medical history, family history, and any available genetic information that may result in a personalized plan for cancer prevention and surveillance. Therefore, it is recommended she continue to follow the cancer management and screening guidelines provided by her oncology and primary healthcare provider.  RECOMMENDATIONS FOR FAMILY MEMBERS:   Since she did not inherit a identifiable mutation in a cancer predisposition gene included on this panel, her children could not have inherited a known mutation from her in one of these genes. Individuals in this family might be at some increased risk of developing cancer, over the general population risk, due to the family  history of cancer.  Individuals in the family should notify their providers of the family history of cancer. We recommend women in this family have a yearly mammogram beginning at age 80, or 72 years younger than the earliest onset of cancer, an annual clinical breast exam, and perform monthly breast self-exams.  Family members should have colonoscopies by at age 11, or earlier, as recommended by their providers. Other members of the family may still carry a pathogenic variant in one of these genes that Ms. Blanford did not inherit. Based on the family history, we recommend relatives on both sides of her family, especially those closely related to the pancreatic/colon cancer, have genetic counseling and testing. Ms. Canete will let us know if we can be of any assistance in coordinating genetic counseling and/or testing for this family member.   We do not recommend familial testing for the BARD1 or BLM variant of uncertain significance (VUS).  FOLLOW-UP:  Lastly, we  discussed with Ms. Paganini that cancer genetics is a rapidly advancing field and it is possible that new genetic tests will be appropriate for her and/or her family members in the future. We encouraged her to remain in contact with cancer genetics on an annual basis so we can update her personal and family histories and let her know of advances in cancer genetics that may benefit this family.   Our contact number was provided. Ms. Diller questions were answered to her satisfaction, and she knows she is welcome to call us at anytime with additional questions or concerns.    Faith Rogue, MS, [facility] Genetic Counselor [email or web address] Phone: [phone]

## 2022-11-26 NOTE — Telephone Encounter (Signed)
I contacted [name] to discuss her genetic testing results. No pathogenic variants were identified in the 70 genes analyzed. Detailed clinic note to follow.   The test report has been scanned into EPIC and is located under the Molecular Pathology section of the Results Review tab.  A portion of the result report is included below for reference.      Terri Rogue, MS, [facility] Genetic Counselor [email or web address] Phone: [phone]

## 2022-12-04 ENCOUNTER — Other Ambulatory Visit: Payer: Self-pay

## 2022-12-04 ENCOUNTER — Inpatient Hospital Stay: Payer: Managed Care, Other (non HMO) | Admitting: Gynecologic Oncology

## 2022-12-04 VITALS — BP 134/83 | HR 100 | Temp 98.1°F | Resp 16 | Ht 61.0 in | Wt 258.8 lb

## 2022-12-04 DIAGNOSIS — C541 Malignant neoplasm of endometrium: Secondary | ICD-10-CM

## 2022-12-04 DIAGNOSIS — Z90710 Acquired absence of both cervix and uterus: Secondary | ICD-10-CM

## 2022-12-04 DIAGNOSIS — Z90722 Acquired absence of ovaries, bilateral: Secondary | ICD-10-CM

## 2022-12-04 NOTE — Patient Instructions (Addendum)
You are healing well from surgery. Please reach out to the office in the lower back ache/discomfort does not improve.  Continue with restriction of nothing in the vagina for 10-12 weeks.  Plan to follow up in six months with our office or sooner if needed. Please call [phone] in July or Aug 2024 to schedule an appointment for end of September/beginning of October.   Symptoms to report to your health care team include vaginal bleeding, rectal bleeding, bloating, weight loss without effort, new and persistent pain, new and  persistent fatigue, new leg swelling, new masses (i.e., bumps in your neck or groin), new and persistent cough, new and persistent nausea and vomiting, change in bowel or bladder habits, and any other concerns.

## 2022-12-05 NOTE — Progress Notes (Signed)
Gynecologic Oncology Post-operative Follow Up  Terri Gamble 62 y.o. female  CC:  Chief Complaint  Patient presents with   Endometrial cancer [facility])    HPI:  Terri Gamble is a 62 y.o. female who was initially seen in consultation on 09/19/2022 at the request of Dr. Senaida Ores for an evaluation of endometrial cancer.   The patient endorses having a history of episodic bleeding since she went through menopause at 68.  In 2018, she had a polyp seen on a saline infused ultrasound and underwent operative hysteroscopy with resection of polyps and a submucosal fibroid.  Pathology at that time revealed a benign endometrial polyp.   In March 2023, she began having spotting.  This was episodic like a menses for several months.  In general, bleeding was light with only 1 day each time requiring the use of a pad.  She was scheduled for an annual exam with Dr. Senaida Ores in May.  Her bleeding had basically stopped by that time.  She also had associated abdominal pain.  By May, this had improved.  Cramping continued intermittently but she denies any further bleeding.   Endometrial biopsy on 01/21/2022 revealed inflamed benign endocervical mucosa.  Scant benign squamous mucosa and scant inactive endometrium with breakdown.  Abundant background mucoid inflammatory material and red blood cells. Pelvic ultrasound at [facility] on 07/21/2022 revealed a uterus measuring 9.4 x 6 x 4.9 cm with an endometrial lining of 21.5 mm.  Several small fibroids noted.  Polyp measuring approximately 25 mm seen on sonohysterogram.  Patient was taken to the OR for hysteroscopic resection of polyps and endometrial sampling with MyoSure as well as Mirena IUD placement.  Findings included multiple endometrial polyps with some proliferative appearing lining noted.  Endometrial curettage on 12/29 revealed polyp with extensive EIN with foci of well-differentiated endometrial carcinoma.  On 09/25/2022, she underwent  robotic-assisted laparoscopic total hysterectomy with bilateral salpingoophorectomy, SLN biopsy bilaterally, lysis of adhesions for approximately 30 minutes, cystoscopy with Dr. Eugene Garnet. Final pathology returned with: FINAL MICROSCOPIC DIAGNOSIS:   A. LYMPH NODE, SENTINEL, RIGHT OBTURATOR, EXCISION:  - Lymph node, negative for carcinoma (0/1)   B. LYMPH NODE, SENTINEL, LEFT OBTURATOR, EXCISION:  - Lymph node, negative for carcinoma (0/1)   C. UTERUS, CERVIX, BILATERAL FALLOPIAN TUBES AND OVARIES WITH MIRENA  IUD:  - Foci of endometrioid carcinoma, FIGO grade 1  - Carcinoma is confined to endometrial without any myometrial invasion  - Adenomyosis  - Benign leiomyomas, 1 and 1.1 cm  - Benign unremarkable cervix  - Benign unremarkable bilateral fallopian tubes and ovaries  - Mirena intrauterine device  - See oncology table   Interval History: She presents to the office today for post-operative follow up. She reports doing well since surgery. She is tolerating her diet with no nausea or emesis. Has mild abdominal soreness intermittently and states she had abdominal issues before surgery as well. She also has midline lower back pain near the sacrum. Voiding without difficulty. She is having mild fatigue but this is improving. No concerns voiced with her abdominal incisions. No vaginal discharge or bleeding reported. Bowels are moving. No lower extremity edema reported. Denies chest pain, dyspnea, new cough. No needs or concerning symptoms voiced.   Review of Systems: See interval. Additional review negative.  Current Meds:  Outpatient Encounter Medications as of 12/04/2022  Medication Sig   BIOTIN PO Take 2,500 mcg by mouth daily.   Cholecalciferol (VITAMIN D3 PO) Take 2 capsules by mouth daily.  lisinopril (ZESTRIL) 10 MG tablet Take 1 tablet by mouth daily.   ondansetron (ZOFRAN-ODT) 4 MG disintegrating tablet Take 1 tablet (4 mg total) by mouth every 8 (eight) hours as needed for  nausea or vomiting.   valACYclovir (VALTREX) 1000 MG tablet Take 1,000 mg by mouth daily as needed (fever blisters).   [DISCONTINUED] senna-docusate (SENOKOT-S) 8.6-50 MG tablet Take 2 tablets by mouth at bedtime. For AFTER surgery, do not take if having diarrhea   [DISCONTINUED] ibuprofen (ADVIL) 800 MG tablet Take 1 tablet (800 mg total) by mouth every 8 (eight) hours as needed for moderate pain. For AFTER surgery only   [DISCONTINUED] traMADol (ULTRAM) 50 MG tablet Take 1 tablet (50 mg total) by mouth every 6 (six) hours as needed for severe pain. For AFTER surgery only, do not take and drive   No facility-administered encounter medications on file as of 12/04/2022.    Allergy:  Allergies  Allergen Reactions   Codeine Nausea Only   Morphine And Related Other (See Comments)    Headache   Latex Rash    Social Hx:   Social History   Socioeconomic History   Marital status: Divorced    Spouse name: Not on file   Number of children: Not on file   Years of education: Not on file   Highest education level: Not on file  Occupational History   Not on file  Tobacco Use   Smoking status: Former    Packs/day: 0.50    Years: 15.00    Additional pack years: 0.00    Total pack years: 7.50    Types: Cigarettes    Quit date: 2006    Years since quitting: 18.2   Smokeless tobacco: Never  Vaping Use   Vaping Use: Never used  Substance and Sexual Activity   Alcohol use: Not Currently    Comment: very rare   Drug use: Never   Sexual activity: Yes    Birth control/protection: Post-menopausal  Other Topics Concern   Not on file  Social History Narrative   Not on file   Social Determinants of Health   Financial Resource Strain: Not on file  Food Insecurity: No Food Insecurity (09/25/2022)   Hunger Vital Sign    Worried About Running Out of Food in the Last Year: Never true    Ran Out of Food in the Last Year: Never true  Transportation Needs: No Transportation Needs (09/25/2022)    PRAPARE - Administrator, Civil Service (Medical): No    Lack of Transportation (Non-Medical): No  Physical Activity: Not on file  Stress: Not on file  Social Connections: Not on file  Intimate Partner Violence: Not At Risk (09/25/2022)   Humiliation, Afraid, Rape, and Kick questionnaire    Fear of Current or Ex-Partner: No    Emotionally Abused: No    Physically Abused: No    Sexually Abused: No    Past Surgical Hx:  Past Surgical History:  Procedure Laterality Date   CESAREAN SECTION  1977, 1984   COLONOSCOPY WITH PROPOFOL  06/05/2015   dr stark   CYSTOSCOPY  09/25/2022   Procedure: CYSTOSCOPY;  Surgeon: Carver Fila, MD;  Location: [facility];  Service: Gynecology;;   DILATATION & CURETTAGE/HYSTEROSCOPY WITH MYOSURE N/A 07/03/2017   Procedure: DILATATION & CURETTAGE/HYSTEROSCOPY WITH MYOSURE;  Surgeon: Huel Cote, MD;  Location: [facility];  Service: Gynecology;  Laterality: N/A;  MD request 30 extra minutes   DILATATION & CURETTAGE/HYSTEROSCOPY WITH MYOSURE N/A 09/05/2022  Procedure: DILATATION & CURETTAGE/HYSTEROSCOPY WITH MYOSURE;  Surgeon: Huel Cote, MD;  Location: [facility];  Service: Gynecology;  Laterality: N/A;   HYSTEROSCOPY WITH D & C  09/04/2004   @[facility]  by dr Senaida Ores   INTRAUTERINE DEVICE (IUD) INSERTION N/A 09/05/2022   Procedure: INTRAUTERINE DEVICE (IUD) INSERTION;  Surgeon: Huel Cote, MD;  Location: [facility] [place];  Service: Gynecology;  Laterality: N/A;   POLYPECTOMY  09/05/2022   ROBOTIC ASSISTED TOTAL HYSTERECTOMY WITH BILATERAL SALPINGO OOPHERECTOMY N/A 09/25/2022   Procedure: XI ROBOTIC ASSISTED TOTAL HYSTERECTOMY WITH BILATERAL SALPINGO OOPHORECTOMY LYSIS OF ADHESIONS;  Surgeon: Carver Fila, MD;  Location: [facility];  Service: Gynecology;  Laterality: N/A;   SENTINEL NODE BIOPSY N/A 09/25/2022   Procedure: SENTINEL NODE BIOPSY;  Surgeon: Carver Fila, MD;  Location: [facility];  Service:  Gynecology;  Laterality: N/A;    Past Medical Hx:  Past Medical History:  Diagnosis Date   Complication of anesthesia    per pt with d&c hysteroscopy done at [facility] 07-03-2017 felt dizzy, headaches, and just did not feel well   Diverticulosis of colon    Endometrial polyp    History of URI (upper respiratory infection) 08/25/2022   recent dx acute bacterial sinusitis and acute cough by pcp note in epic 08-25-2022 per pt had sympstoms for 1 wk prior to dx ,  pt stated completed antibiotic and prednisone on 12/ 25/ 2023   HSV infection    oral symptoms, uses valtrex for symptoms   Hypertension    Menorrhagia    Seasonal allergies    Thrombocytosis    Uterine fibroid     Family Hx:  Family History  Problem Relation Age of Onset   Heart disease Mother    Kidney disease Mother    Hypertension Mother    Diabetes Mother    Colon cancer Brother 66   Pancreatic cancer Maternal Aunt        d. 58s   Lung cancer Maternal Uncle        d. 10s   Stomach cancer Paternal Uncle        d. >50   Liver cancer Paternal Uncle        d.>50   Lung cancer Paternal Uncle        d.>50   Diabetes Maternal Grandmother    Hypertension Maternal Grandmother    Heart disease Maternal Grandmother    Stroke Maternal Grandmother    Heart disease Maternal Grandfather    Cardiomyopathy Maternal Grandfather    Heart disease Paternal Grandmother    Stroke Paternal Grandmother    Hypertension Paternal Grandmother    Alzheimer's disease Paternal Grandfather    Colon cancer Cousin        double first cousin - d. late 68s   Colon cancer Cousin    Lung cancer Cousin    Esophageal cancer Neg Hx    Liver disease Neg Hx    Ovarian cancer Neg Hx    Breast cancer Neg Hx     Vitals:  Blood pressure 134/83, pulse 100, temperature 98.1 F (36.7 C), temperature source Oral, resp. rate 16, height 5\' 1"  (1.549 m), weight 258 lb 12.8 oz (117.4 kg), SpO2 100 %.  Physical Exam: General: Well developed, well nourished  female in no acute distress. Alert and oriented x 3.  Cardiovascular: Regular rate and rhythm. S1 and S2 normal.  Lungs: Clear to auscultation bilaterally. No wheezes/crackles/rhonchi noted.  Skin: No rashes or lesions present. Back:  No CVA tenderness.  Abdomen: Abdomen soft, non-tender and obese. Active bowel sounds in all quadrants. No evidence of a fluid wave or abdominal masses. Abdominal lap site incisions are healing well without erythema or drainage. Genitourinary:    Vulva/vagina: Normal external female genitalia. No lesions.    Urethra: No lesions or masses    Vagina: No lesions or palpable masses. No vaginal bleeding or drainage noted. Vaginal cuff is intact without nodularity. Bimanual confirms. Extremities: No bilateral cyanosis, edema, or clubbing.   Assessment/Plan: 62 year old female with Stage IA grade 1 endometrioid carcinoma s/p robotic-assisted laparoscopic total hysterectomy with bilateral salpingoophorectomy, SLN biopsy bilaterally, lysis of adhesions for approximately 30 minutes, cystoscopy with Dr. Eugene Garnet on 09/25/2022. No adjuvant therapy is recommended. She is meeting post-operative milestones. Dr. Pricilla Holm to the room as well to discuss pathology and recommendations. Post-operative restrictions reinforced. She is advised to follow up in six months or sooner if needed. She is advised to call closer to the date to schedule. Reportable signs and symptoms reviewed and she is advised to call for any needs, new symptoms, or concerns.     Doylene Bode, NP 12/05/2022, 5:13 PM

## 2022-12-17 ENCOUNTER — Encounter: Payer: Managed Care, Other (non HMO) | Admitting: Internal Medicine

## 2022-12-17 NOTE — Progress Notes (Deleted)
Subjective:    Patient ID: [name], female    DOB: 1961/06/15, 62 y.o.   MRN: [id number]  HPI  Patient presents to clinic today for her annual exam.  Flu: Tetanus: COVID: Pfizer x 2 Shingrix: Never Pap smear: Hysterectomy Mammogram: 01/2022 Bone density: 05/2015 Colon screening: Vision screening: Dentist:  Diet: Exercise:  Review of Systems  Past Medical History:  Diagnosis Date   Complication of anesthesia    per pt with d&c hysteroscopy done at [facility] 07-03-2017 felt dizzy, headaches, and just did not feel well   Diverticulosis of colon    Endometrial polyp    History of URI (upper respiratory infection) 08/25/2022   recent dx acute bacterial sinusitis and acute cough by pcp note in epic 08-25-2022 per pt had sympstoms for 1 wk prior to dx ,  pt stated completed antibiotic and prednisone on 12/ 25/ 2023   HSV infection    oral symptoms, uses valtrex for symptoms   Hypertension    Menorrhagia    Seasonal allergies    Thrombocytosis    Uterine fibroid     Current Outpatient Medications  Medication Sig Dispense Refill   BIOTIN PO Take 2,500 mcg by mouth daily.     Cholecalciferol (VITAMIN D3 PO) Take 2 capsules by mouth daily.     lisinopril (ZESTRIL) 10 MG tablet Take 1 tablet by mouth daily.     ondansetron (ZOFRAN-ODT) 4 MG disintegrating tablet Take 1 tablet (4 mg total) by mouth every 8 (eight) hours as needed for nausea or vomiting. 20 tablet 0   valACYclovir (VALTREX) 1000 MG tablet Take 1,000 mg by mouth daily as needed (fever blisters).     No current facility-administered medications for this visit.    Allergies  Allergen Reactions   Codeine Nausea Only   Morphine And Related Other (See Comments)    Headache   Latex Rash    Family History  Problem Relation Age of Onset   Heart disease Mother    Kidney disease Mother    Hypertension Mother    Diabetes Mother    Colon cancer Brother 48   Pancreatic cancer Maternal Aunt        d. 73s    Lung cancer Maternal Uncle        d. 39s   Stomach cancer Paternal Uncle        d. >50   Liver cancer Paternal Uncle        d.>50   Lung cancer Paternal Uncle        d.>50   Diabetes Maternal Grandmother    Hypertension Maternal Grandmother    Heart disease Maternal Grandmother    Stroke Maternal Grandmother    Heart disease Maternal Grandfather    Cardiomyopathy Maternal Grandfather    Heart disease Paternal Grandmother    Stroke Paternal Grandmother    Hypertension Paternal Grandmother    Alzheimer's disease Paternal Grandfather    Colon cancer Cousin        double first cousin - d. late 41s   Colon cancer Cousin    Lung cancer Cousin    Esophageal cancer Neg Hx    Liver disease Neg Hx    Ovarian cancer Neg Hx    Breast cancer Neg Hx     Social History   Socioeconomic History   Marital status: Divorced    Spouse name: Not on file   Number of children: Not on file   Years of education: Not on file  Highest education level: Not on file  Occupational History   Not on file  Tobacco Use   Smoking status: Former    Packs/day: 0.50    Years: 15.00    Additional pack years: 0.00    Total pack years: 7.50    Types: Cigarettes    Quit date: 2006    Years since quitting: 18.2   Smokeless tobacco: Never  Vaping Use   Vaping Use: Never used  Substance and Sexual Activity   Alcohol use: Not Currently    Comment: very rare   Drug use: Never   Sexual activity: Yes    Birth control/protection: Post-menopausal  Other Topics Concern   Not on file  Social History Narrative   Not on file   Social Determinants of Health   Financial Resource Strain: Not on file  Food Insecurity: No Food Insecurity (09/25/2022)   Hunger Vital Sign    Worried About Running Out of Food in the Last Year: Never true    Ran Out of Food in the Last Year: Never true  Transportation Needs: No Transportation Needs (09/25/2022)   PRAPARE - Administrator, Civil Service (Medical): No     Lack of Transportation (Non-Medical): No  Physical Activity: Not on file  Stress: Not on file  Social Connections: Not on file  Intimate Partner Violence: Not At Risk (09/25/2022)   Humiliation, Afraid, Rape, and Kick questionnaire    Fear of Current or Ex-Partner: No    Emotionally Abused: No    Physically Abused: No    Sexually Abused: No     Constitutional: Denies fever, malaise, fatigue, headache or abrupt weight changes.  HEENT: Denies eye pain, eye redness, ear pain, ringing in the ears, wax buildup, runny nose, nasal congestion, bloody nose, or sore throat. Respiratory: Denies difficulty breathing, shortness of breath, cough or sputum production.   Cardiovascular: Denies chest pain, chest tightness, palpitations or swelling in the hands or feet.  Gastrointestinal: Patient reports intermittent constipation.  Denies abdominal pain, bloating, diarrhea or blood in the stool.  GU: Denies urgency, frequency, pain with urination, burning sensation, blood in urine, odor or discharge. Musculoskeletal: Denies decrease in range of motion, difficulty with gait, muscle pain or joint pain and swelling.  Skin: Denies redness, rashes, lesions or ulcercations.  Neurological: Denies dizziness, difficulty with memory, difficulty with speech or problems with balance and coordination.  Psych: Denies anxiety, depression, SI/HI.  No other specific complaints in a complete review of systems (except as listed in HPI above).     Objective:   Physical Exam   There were no vitals taken for this visit. Wt Readings from Last 3 Encounters:  12/04/22 258 lb 12.8 oz (117.4 kg)  09/25/22 256 lb (116.1 kg)  09/24/22 256 lb (116.1 kg)    General: Appears their stated age, well developed, well nourished in NAD. Skin: Warm, dry and intact. No rashes, lesions or ulcerations noted. HEENT: Head: normal shape and size; Eyes: sclera white, no icterus, conjunctiva pink, PERRLA and EOMs intact; Ears: Tm's gray  and intact, normal light reflex; Nose: mucosa pink and moist, septum midline; Throat/Mouth: Teeth present, mucosa pink and moist, no exudate, lesions or ulcerations noted.  Neck:  Neck supple, trachea midline. No masses, lumps or thyromegaly present.  Cardiovascular: Normal rate and rhythm. S1,S2 noted.  No murmur, rubs or gallops noted. No JVD or BLE edema. No carotid bruits noted. Pulmonary/Chest: Normal effort and positive vesicular breath sounds. No respiratory distress. No wheezes,  rales or ronchi noted.  Abdomen: Soft and nontender. Normal bowel sounds. No distention or masses noted. Liver, spleen and kidneys non palpable. Musculoskeletal: Normal range of motion. No signs of joint swelling. No difficulty with gait.  Neurological: Alert and oriented. Cranial nerves II-XII grossly intact. Coordination normal.  Psychiatric: Mood and affect normal. Behavior is normal. Judgment and thought content normal.     BMET    Component Value Date/Time   NA 135 09/26/2022 0546   NA 138 08/03/2019 0000   NA 142 12/11/2011 1941   K 4.5 09/26/2022 0546   K 4.1 12/11/2011 1941   CL 104 09/26/2022 0546   CL 104 12/11/2011 1941   CO2 23 09/26/2022 0546   CO2 27 12/11/2011 1941   GLUCOSE 116 (H) 09/26/2022 0546   GLUCOSE 83 12/11/2011 1941   BUN 22 09/26/2022 0546   BUN 19 08/03/2019 0000   BUN 20 (H) 12/11/2011 1941   CREATININE 0.95 09/26/2022 0546   CREATININE 0.81 12/11/2011 1941   CALCIUM 8.7 (L) 09/26/2022 0546   CALCIUM 9.1 12/11/2011 1941   GFRNONAA >60 09/26/2022 0546   GFRNONAA >60 12/11/2011 1941   GFRAA >60 02/18/2017 1109   GFRAA >60 12/11/2011 1941    Lipid Panel     Component Value Date/Time   CHOL 205 (A) 08/03/2019 0000   TRIG 91 08/03/2019 0000   HDL 66 08/03/2019 0000   LDLCALC 123 08/03/2019 0000    CBC    Component Value Date/Time   WBC 16.0 (H) 09/26/2022 0546   RBC 4.24 09/26/2022 0546   HGB 12.4 09/26/2022 0546   HGB 14.4 12/11/2011 1941   HCT 37.7  09/26/2022 0546   HCT 42.4 12/11/2011 1941   PLT 397 09/26/2022 0546   PLT 371 12/11/2011 1941   MCV 88.9 09/26/2022 0546   MCV 88 12/11/2011 1941   MCH 29.2 09/26/2022 0546   MCHC 32.9 09/26/2022 0546   RDW 12.9 09/26/2022 0546   RDW 13.3 12/11/2011 1941   LYMPHSABS 1.8 11/18/2021 1040   MONOABS 0.8 11/18/2021 1040   EOSABS 0.2 11/18/2021 1040   BASOSABS 0.1 11/18/2021 1040    Hgb A1C Lab Results  Component Value Date   HGBA1C 5.4 04/15/2018           Assessment & Plan:   Preventative Health Maintenance:  Encouraged her to get a flu shot in the fall Tetanus Encouraged her to get her COVID booster Discussed Shingrix vaccine, she will check coverage with her insurance company and schedule visit if she would like to have this done She no longer needs Pap smears Mammogram and bone density ordered-she will call to schedule Colon screening UTD Encouraged her to consume a balanced diet and exercise regimen Advised her to see an eye doctor and dentist annually We will check CBC, c-Met, lipid, A1c, HIV and hep C today  RTC in 6 months, follow-up chronic conditions Nicki Reaperegina Zain Lankford, NP

## 2022-12-26 ENCOUNTER — Ambulatory Visit: Payer: Managed Care, Other (non HMO) | Admitting: Internal Medicine

## 2022-12-26 NOTE — Progress Notes (Deleted)
Subjective:    Patient ID: Terri Gamble, female    DOB: March 23, 1961, 62 y.o.   MRN: [id number]  HPI  Patient presents to clinic today for follow-up of chronic conditions.  HTN: Her BP today is.  She is taking Lisinopril as prescribed.  ECG from 09/2022 reviewed.  Chronic Constipation: She takes as needed with some relief of symptoms.  Colonoscopy from 05/2015 reviewed.  History of Endometrial Cancer: In remission status post.  She no longer follows with oncology.  Prediabetes: Her last A1c was 5.6%, 04/2022.  She is not taking any oral diabetic medication at this time.  She does not check her sugars.  Review of Systems     Past Medical History:  Diagnosis Date   Complication of anesthesia    per pt with d&c hysteroscopy done at [facility] 07-03-2017 felt dizzy, headaches, and just did not feel well   Diverticulosis of colon    Endometrial polyp    History of URI (upper respiratory infection) 08/25/2022   recent dx acute bacterial sinusitis and acute cough by pcp note in epic 08-25-2022 per pt had sympstoms for 1 wk prior to dx ,  pt stated completed antibiotic and prednisone on 12/ 25/ 2023   HSV infection    oral symptoms, uses valtrex for symptoms   Hypertension    Menorrhagia    Seasonal allergies    Thrombocytosis    Uterine fibroid     Current Outpatient Medications  Medication Sig Dispense Refill   BIOTIN PO Take 2,500 mcg by mouth daily.     Cholecalciferol (VITAMIN D3 PO) Take 2 capsules by mouth daily.     lisinopril (ZESTRIL) 10 MG tablet Take 1 tablet by mouth daily.     ondansetron (ZOFRAN-ODT) 4 MG disintegrating tablet Take 1 tablet (4 mg total) by mouth every 8 (eight) hours as needed for nausea or vomiting. 20 tablet 0   valACYclovir (VALTREX) 1000 MG tablet Take 1,000 mg by mouth daily as needed (fever blisters).     No current facility-administered medications for this visit.    Allergies  Allergen Reactions   Codeine Nausea Only   Morphine And Related  Other (See Comments)    Headache   Latex Rash    Family History  Problem Relation Age of Onset   Heart disease Mother    Kidney disease Mother    Hypertension Mother    Diabetes Mother    Colon cancer Brother 82   Pancreatic cancer Maternal Aunt        d. 97s   Lung cancer Maternal Uncle        d. 75s   Stomach cancer Paternal Uncle        d. >50   Liver cancer Paternal Uncle        d.>50   Lung cancer Paternal Uncle        d.>50   Diabetes Maternal Grandmother    Hypertension Maternal Grandmother    Heart disease Maternal Grandmother    Stroke Maternal Grandmother    Heart disease Maternal Grandfather    Cardiomyopathy Maternal Grandfather    Heart disease Paternal Grandmother    Stroke Paternal Grandmother    Hypertension Paternal Grandmother    Alzheimer's disease Paternal Grandfather    Colon cancer Cousin        double first cousin - d. late 107s   Colon cancer Cousin    Lung cancer Cousin    Esophageal cancer Neg Hx  Liver disease Neg Hx    Ovarian cancer Neg Hx    Breast cancer Neg Hx     Social History   Socioeconomic History   Marital status: Divorced    Spouse name: Not on file   Number of children: Not on file   Years of education: Not on file   Highest education level: Not on file  Occupational History   Not on file  Tobacco Use   Smoking status: Former    Packs/day: 0.50    Years: 15.00    Additional pack years: 0.00    Total pack years: 7.50    Types: Cigarettes    Quit date: 2006    Years since quitting: 18.3   Smokeless tobacco: Never  Vaping Use   Vaping Use: Never used  Substance and Sexual Activity   Alcohol use: Not Currently    Comment: very rare   Drug use: Never   Sexual activity: Yes    Birth control/protection: Post-menopausal  Other Topics Concern   Not on file  Social History Narrative   Not on file   Social Determinants of Health   Financial Resource Strain: Not on file  Food Insecurity: No Food Insecurity  (09/25/2022)   Hunger Vital Sign    Worried About Running Out of Food in the Last Year: Never true    Ran Out of Food in the Last Year: Never true  Transportation Needs: No Transportation Needs (09/25/2022)   PRAPARE - Administrator, Civil Service (Medical): No    Lack of Transportation (Non-Medical): No  Physical Activity: Not on file  Stress: Not on file  Social Connections: Not on file  Intimate Partner Violence: Not At Risk (09/25/2022)   Humiliation, Afraid, Rape, and Kick questionnaire    Fear of Current or Ex-Partner: No    Emotionally Abused: No    Physically Abused: No    Sexually Abused: No     Constitutional: Patient reports fatigue.  Denies fever, malaise, headache or abrupt weight changes.  HEENT: Denies eye pain, eye redness, ear pain, ringing in the ears, wax buildup, runny nose, nasal congestion, bloody nose, or sore throat. Respiratory: Denies difficulty breathing, shortness of breath, cough or sputum production.   Cardiovascular: Denies chest pain, chest tightness, palpitations or swelling in the hands or feet.  Gastrointestinal: Patient reports constipation.  Denies abdominal pain, bloating, diarrhea or blood in the stool.  GU: Denies urgency, frequency, pain with urination, burning sensation, blood in urine, odor or discharge. Musculoskeletal: Denies decrease in range of motion, difficulty with gait, muscle pain or joint pain and swelling.  Skin: Denies redness, rashes, lesions or ulcercations.  Neurological: Denies dizziness, difficulty with memory, difficulty with speech or problems with balance and coordination.  Psych: Denies anxiety, depression, SI/HI.  No other specific complaints in a complete review of systems (except as listed in HPI above).  Objective:   Physical Exam    There were no vitals taken for this visit. Wt Readings from Last 3 Encounters:  12/04/22 258 lb 12.8 oz (117.4 kg)  09/25/22 256 lb (116.1 kg)  09/24/22 256 lb (116.1  kg)    General: Appears their stated age, well developed, well nourished in NAD. Skin: Warm, dry and intact. No rashes, lesions or ulcerations noted. HEENT: Head: normal shape and size; Eyes: sclera white, no icterus, conjunctiva pink, PERRLA and EOMs intact; Ears: Tm's gray and intact, normal light reflex; Nose: mucosa pink and moist, septum midline; Throat/Mouth: Teeth present, mucosa  pink and moist, no exudate, lesions or ulcerations noted.  Neck:  Neck supple, trachea midline. No masses, lumps or thyromegaly present.  Cardiovascular: Normal rate and rhythm. S1,S2 noted.  No murmur, rubs or gallops noted. No JVD or BLE edema. No carotid bruits noted. Pulmonary/Chest: Normal effort and positive vesicular breath sounds. No respiratory distress. No wheezes, rales or ronchi noted.  Abdomen: Soft and nontender. Normal bowel sounds. No distention or masses noted. Liver, spleen and kidneys non palpable. Musculoskeletal: Normal range of motion. No signs of joint swelling. No difficulty with gait.  Neurological: Alert and oriented. Cranial nerves II-XII grossly intact. Coordination normal.  Psychiatric: Mood and affect normal. Behavior is normal. Judgment and thought content normal.     BMET    Component Value Date/Time   NA 135 09/26/2022 0546   NA 138 08/03/2019 0000   NA 142 12/11/2011 1941   K 4.5 09/26/2022 0546   K 4.1 12/11/2011 1941   CL 104 09/26/2022 0546   CL 104 12/11/2011 1941   CO2 23 09/26/2022 0546   CO2 27 12/11/2011 1941   GLUCOSE 116 (H) 09/26/2022 0546   GLUCOSE 83 12/11/2011 1941   BUN 22 09/26/2022 0546   BUN 19 08/03/2019 0000   BUN 20 (H) 12/11/2011 1941   CREATININE 0.95 09/26/2022 0546   CREATININE 0.81 12/11/2011 1941   CALCIUM 8.7 (L) 09/26/2022 0546   CALCIUM 9.1 12/11/2011 1941   GFRNONAA >60 09/26/2022 0546   GFRNONAA >60 12/11/2011 1941   GFRAA >60 02/18/2017 1109   GFRAA >60 12/11/2011 1941    Lipid Panel     Component Value Date/Time   CHOL  205 (A) 08/03/2019 0000   TRIG 91 08/03/2019 0000   HDL 66 08/03/2019 0000   LDLCALC 123 08/03/2019 0000    CBC    Component Value Date/Time   WBC 16.0 (H) 09/26/2022 0546   RBC 4.24 09/26/2022 0546   HGB 12.4 09/26/2022 0546   HGB 14.4 12/11/2011 1941   HCT 37.7 09/26/2022 0546   HCT 42.4 12/11/2011 1941   PLT 397 09/26/2022 0546   PLT 371 12/11/2011 1941   MCV 88.9 09/26/2022 0546   MCV 88 12/11/2011 1941   MCH 29.2 09/26/2022 0546   MCHC 32.9 09/26/2022 0546   RDW 12.9 09/26/2022 0546   RDW 13.3 12/11/2011 1941   LYMPHSABS 1.8 11/18/2021 1040   MONOABS 0.8 11/18/2021 1040   EOSABS 0.2 11/18/2021 1040   BASOSABS 0.1 11/18/2021 1040    Hgb A1C Lab Results  Component Value Date   HGBA1C 5.4 04/15/2018          Assessment & Plan:     RTC in 6 months for annual exam Nicki Reaper, NP

## 2022-12-28 ENCOUNTER — Other Ambulatory Visit: Payer: Self-pay | Admitting: Internal Medicine

## 2022-12-29 NOTE — Telephone Encounter (Signed)
Requested medication (s) are due for refill today: Yes  Requested medication (s) are on the active medication list: Yes  Last refill:  09/12/22  Future visit scheduled: Yes  Notes to clinic:  Historical provider.    Requested Prescriptions  Pending Prescriptions Disp Refills   lisinopril (ZESTRIL) 10 MG tablet [Pharmacy Med Name: LISINOPRIL  TABLETS] 90 tablet     Sig: TAKE 1 TABLET(10 MG) BY MOUTH DAILY     Cardiovascular:  ACE Inhibitors Failed - 12/28/2022  3:18 AM      Failed - Valid encounter within last 6 months    Recent Outpatient Visits           10 months ago Essential hypertension   [place] [facility] [place], Salvadore Oxford, NP   1 year ago Essential hypertension   [place] [facility] [place], Salvadore Oxford, NP   1 year ago Viral URI with cough   [place] [facility] [place], Salvadore Oxford, NP   2 years ago Viral infection   [place] [facility], Jodelle Gross, FNP   2 years ago Essential hypertension   [place] [facility] [place], [place], [place]       Future Appointments             Tomorrow [place], Salvadore Oxford, NP  [facility]            Passed - Cr in normal range and within 180 days    Creatinine  Date Value Ref Range Status  12/11/2011 0.81 0.60 - 1.30 mg/dL Final   Creatinine, Ser  Date Value Ref Range Status  09/26/2022 0.95 0.44 - 1.00 mg/dL Final         Passed - K in normal range and within 180 days    Potassium  Date Value Ref Range Status  09/26/2022 4.5 3.5 - 5.1 mmol/L Final  12/11/2011 4.1 3.5 - 5.1 mmol/L Final         Passed - Patient is not pregnant      Passed - Last BP in normal range    BP Readings from Last 1 Encounters:  12/04/22 134/83

## 2022-12-30 ENCOUNTER — Ambulatory Visit: Payer: Managed Care, Other (non HMO) | Admitting: Internal Medicine

## 2022-12-30 NOTE — Progress Notes (Deleted)
Subjective:    Patient ID: Terri Gamble, female    DOB: 1961-05-18, 62 y.o.   MRN: [id number]  HPI  Patient presents to clinic today for her annual exam.  Flu: Never Tetanus: COVID: Pfizer x 2 Shingrix: Never Pap smear: Hysterectomy Mammogram: 01/2022 Bone density:  Colon screening: 05/2015 Vision screening: Dentist:  Diet: Exercise:  Review of Systems     Past Medical History:  Diagnosis Date   Complication of anesthesia    per pt with d&c hysteroscopy done at [facility] 07-03-2017 felt dizzy, headaches, and just did not feel well   Diverticulosis of colon    Endometrial polyp    History of URI (upper respiratory infection) 08/25/2022   recent dx acute bacterial sinusitis and acute cough by pcp note in epic 08-25-2022 per pt had sympstoms for 1 wk prior to dx ,  pt stated completed antibiotic and prednisone on 12/ 25/ 2023   HSV infection    oral symptoms, uses valtrex for symptoms   Hypertension    Menorrhagia    Seasonal allergies    Thrombocytosis    Uterine fibroid     Current Outpatient Medications  Medication Sig Dispense Refill   BIOTIN PO Take 2,500 mcg by mouth daily.     Cholecalciferol (VITAMIN D3 PO) Take 2 capsules by mouth daily.     lisinopril (ZESTRIL) 10 MG tablet Take 1 tablet by mouth daily.     ondansetron (ZOFRAN-ODT) 4 MG disintegrating tablet Take 1 tablet (4 mg total) by mouth every 8 (eight) hours as needed for nausea or vomiting. 20 tablet 0   valACYclovir (VALTREX) 1000 MG tablet Take 1,000 mg by mouth daily as needed (fever blisters).     No current facility-administered medications for this visit.    Allergies  Allergen Reactions   Codeine Nausea Only   Morphine And Related Other (See Comments)    Headache   Latex Rash    Family History  Problem Relation Age of Onset   Heart disease Mother    Kidney disease Mother    Hypertension Mother    Diabetes Mother    Colon cancer Brother 50   Pancreatic cancer Maternal Aunt         d. 56s   Lung cancer Maternal Uncle        d. 33s   Stomach cancer Paternal Uncle        d. >50   Liver cancer Paternal Uncle        d.>50   Lung cancer Paternal Uncle        d.>50   Diabetes Maternal Grandmother    Hypertension Maternal Grandmother    Heart disease Maternal Grandmother    Stroke Maternal Grandmother    Heart disease Maternal Grandfather    Cardiomyopathy Maternal Grandfather    Heart disease Paternal Grandmother    Stroke Paternal Grandmother    Hypertension Paternal Grandmother    Alzheimer's disease Paternal Grandfather    Colon cancer Cousin        double first cousin - d. late 17s   Colon cancer Cousin    Lung cancer Cousin    Esophageal cancer Neg Hx    Liver disease Neg Hx    Ovarian cancer Neg Hx    Breast cancer Neg Hx     Social History   Socioeconomic History   Marital status: Divorced    Spouse name: Not on file   Number of children: Not on file   Years  of education: Not on file   Highest education level: Not on file  Occupational History   Not on file  Tobacco Use   Smoking status: Former    Packs/day: 0.50    Years: 15.00    Additional pack years: 0.00    Total pack years: 7.50    Types: Cigarettes    Quit date: 2006    Years since quitting: 18.3   Smokeless tobacco: Never  Vaping Use   Vaping Use: Never used  Substance and Sexual Activity   Alcohol use: Not Currently    Comment: very rare   Drug use: Never   Sexual activity: Yes    Birth control/protection: Post-menopausal  Other Topics Concern   Not on file  Social History Narrative   Not on file   Social Determinants of Health   Financial Resource Strain: Not on file  Food Insecurity: No Food Insecurity (09/25/2022)   Hunger Vital Sign    Worried About Running Out of Food in the Last Year: Never true    Ran Out of Food in the Last Year: Never true  Transportation Needs: No Transportation Needs (09/25/2022)   PRAPARE - Administrator, Civil Service  (Medical): No    Lack of Transportation (Non-Medical): No  Physical Activity: Not on file  Stress: Not on file  Social Connections: Not on file  Intimate Partner Violence: Not At Risk (09/25/2022)   Humiliation, Afraid, Rape, and Kick questionnaire    Fear of Current or Ex-Partner: No    Emotionally Abused: No    Physically Abused: No    Sexually Abused: No     Constitutional: Denies fever, malaise, fatigue, headache or abrupt weight changes.  HEENT: Denies eye pain, eye redness, ear pain, ringing in the ears, wax buildup, runny nose, nasal congestion, bloody nose, or sore throat. Respiratory: Denies difficulty breathing, shortness of breath, cough or sputum production.   Cardiovascular: Denies chest pain, chest tightness, palpitations or swelling in the hands or feet.  Gastrointestinal: Patient reports intermittent constipation.  Denies abdominal pain, bloating, diarrhea or blood in the stool.  GU: Denies urgency, frequency, pain with urination, burning sensation, blood in urine, odor or discharge. Musculoskeletal: Denies decrease in range of motion, difficulty with gait, muscle pain or joint pain and swelling.  Skin: Denies redness, rashes, lesions or ulcercations.  Neurological: Denies dizziness, difficulty with memory, difficulty with speech or problems with balance and coordination.  Psych: Denies anxiety, depression, SI/HI.  No other specific complaints in a complete review of systems (except as listed in HPI above).  Objective:   Physical Exam   There were no vitals taken for this visit. Wt Readings from Last 3 Encounters:  12/04/22 258 lb 12.8 oz (117.4 kg)  09/25/22 256 lb (116.1 kg)  09/24/22 256 lb (116.1 kg)    General: Appears their stated age, well developed, well nourished in NAD. Skin: Warm, dry and intact. No rashes, lesions or ulcerations noted. HEENT: Head: normal shape and size; Eyes: sclera white, no icterus, conjunctiva pink, PERRLA and EOMs intact; Ears:  Tm's gray and intact, normal light reflex; Nose: mucosa pink and moist, septum midline; Throat/Mouth: Teeth present, mucosa pink and moist, no exudate, lesions or ulcerations noted.  Neck:  Neck supple, trachea midline. No masses, lumps or thyromegaly present.  Cardiovascular: Normal rate and rhythm. S1,S2 noted.  No murmur, rubs or gallops noted. No JVD or BLE edema. No carotid bruits noted. Pulmonary/Chest: Normal effort and positive vesicular breath sounds. No  respiratory distress. No wheezes, rales or ronchi noted.  Abdomen: Soft and nontender. Normal bowel sounds. No distention or masses noted. Liver, spleen and kidneys non palpable. Musculoskeletal: Normal range of motion. No signs of joint swelling. No difficulty with gait.  Neurological: Alert and oriented. Cranial nerves II-XII grossly intact. Coordination normal.  Psychiatric: Mood and affect normal. Behavior is normal. Judgment and thought content normal.    BMET    Component Value Date/Time   NA 135 09/26/2022 0546   NA 138 08/03/2019 0000   NA 142 12/11/2011 1941   K 4.5 09/26/2022 0546   K 4.1 12/11/2011 1941   CL 104 09/26/2022 0546   CL 104 12/11/2011 1941   CO2 23 09/26/2022 0546   CO2 27 12/11/2011 1941   GLUCOSE 116 (H) 09/26/2022 0546   GLUCOSE 83 12/11/2011 1941   BUN 22 09/26/2022 0546   BUN 19 08/03/2019 0000   BUN 20 (H) 12/11/2011 1941   CREATININE 0.95 09/26/2022 0546   CREATININE 0.81 12/11/2011 1941   CALCIUM 8.7 (L) 09/26/2022 0546   CALCIUM 9.1 12/11/2011 1941   GFRNONAA >60 09/26/2022 0546   GFRNONAA >60 12/11/2011 1941   GFRAA >60 02/18/2017 1109   GFRAA >60 12/11/2011 1941    Lipid Panel     Component Value Date/Time   CHOL 205 (A) 08/03/2019 0000   TRIG 91 08/03/2019 0000   HDL 66 08/03/2019 0000   LDLCALC 123 08/03/2019 0000    CBC    Component Value Date/Time   WBC 16.0 (H) 09/26/2022 0546   RBC 4.24 09/26/2022 0546   HGB 12.4 09/26/2022 0546   HGB 14.4 12/11/2011 1941   HCT  37.7 09/26/2022 0546   HCT 42.4 12/11/2011 1941   PLT 397 09/26/2022 0546   PLT 371 12/11/2011 1941   MCV 88.9 09/26/2022 0546   MCV 88 12/11/2011 1941   MCH 29.2 09/26/2022 0546   MCHC 32.9 09/26/2022 0546   RDW 12.9 09/26/2022 0546   RDW 13.3 12/11/2011 1941   LYMPHSABS 1.8 11/18/2021 1040   MONOABS 0.8 11/18/2021 1040   EOSABS 0.2 11/18/2021 1040   BASOSABS 0.1 11/18/2021 1040    Hgb A1C Lab Results  Component Value Date   HGBA1C 5.4 04/15/2018           Assessment & Plan:   Preventative Health Maintenance:  Encouraged her to get a flu shot in the fall Tetanus UTD COVID-vaccine UTD Discussed Shingrix vaccine, she will check coverage with her insurance company and schedule visit if she would like to have this done She no longer needs Pap smears Mammogram and bone density ordered-she will call to schedule Colon screening UTD Encouraged her to consume a balanced diet and exercise regimen Advised her to see an eye doctor and dentist annually We will check CBC, c-Met, lipid, A1c, HIV and hep C today  RTC in 6 months, follow-up chronic conditions Nicki Reaper, NP

## 2023-01-05 ENCOUNTER — Other Ambulatory Visit: Payer: Self-pay | Admitting: Internal Medicine

## 2023-01-05 NOTE — Telephone Encounter (Signed)
Requested medication (s) are due for refill today: Yes  Requested medication (s) are on the active medication list: Yes  Last refill:  09/12/22  Future visit scheduled: Yes  Notes to clinic:  Unable to refill per protocol, last refill by another provider. Out of med.     Requested Prescriptions  Pending Prescriptions Disp Refills   lisinopril (ZESTRIL) 10 MG tablet      Sig: Take 1 tablet (10 mg total) by mouth daily.     Cardiovascular:  ACE Inhibitors Failed - 01/05/2023 12:58 PM      Failed - Valid encounter within last 6 months    Recent Outpatient Visits           10 months ago Essential hypertension   [place] [facility] [place], Salvadore Oxford, NP   1 year ago Essential hypertension   [place] [facility] [place], Salvadore Oxford, NP   1 year ago Viral URI with cough   [place] [facility] [place], Salvadore Oxford, NP   2 years ago Viral infection   [place] [facility], Jodelle Gross, FNP   2 years ago Essential hypertension   [place] [facility] [place], Jodelle Gross, FNP       Future Appointments             Tomorrow [place] Si, Salvadore Oxford, NP  [facility]            Passed - Cr in normal range and within 180 days    Creatinine  Date Value Ref Range Status  12/11/2011 0.81 0.60 - 1.30 mg/dL Final   Creatinine, Ser  Date Value Ref Range Status  09/26/2022 0.95 0.44 - 1.00 mg/dL Final         Passed - K in normal range and within 180 days    Potassium  Date Value Ref Range Status  09/26/2022 4.5 3.5 - 5.1 mmol/L Final  12/11/2011 4.1 3.5 - 5.1 mmol/L Final         Passed - Patient is not pregnant      Passed - Last BP in normal range    BP Readings from Last 1 Encounters:  12/04/22 134/83

## 2023-01-05 NOTE — Telephone Encounter (Signed)
Medication Refill - Medication: lisinopril (ZESTRIL) 10 MG tablet   Has the patient contacted their pharmacy? Yes Request sent from pharmacy on 4/21 was denied because patient needs appointment.  Preferred Pharmacy (with phone number or street name):  [id number] - [place], [place] - [address] AT [facility] OF [address] [facility] Phone: [phone]  Fax: [phone]     Has the patient been seen for an appointment in the last year OR does the patient have an upcoming appointment? Yes.   Next OV 01/06/23  Agent: Please be advised that RX refills may take up to 3 business days. We ask that you follow-up with your pharmacy.  *Patient states she took her last pill yesterday.

## 2023-01-06 ENCOUNTER — Encounter: Payer: Self-pay | Admitting: Internal Medicine

## 2023-01-06 ENCOUNTER — Ambulatory Visit (INDEPENDENT_AMBULATORY_CARE_PROVIDER_SITE_OTHER): Payer: Managed Care, Other (non HMO) | Admitting: Internal Medicine

## 2023-01-06 VITALS — BP 138/86 | HR 102 | Temp 97.1°F | Ht 62.0 in | Wt 261.0 lb

## 2023-01-06 DIAGNOSIS — Z1159 Encounter for screening for other viral diseases: Secondary | ICD-10-CM | POA: Diagnosis not present

## 2023-01-06 DIAGNOSIS — Z6841 Body Mass Index (BMI) 40.0 and over, adult: Secondary | ICD-10-CM

## 2023-01-06 DIAGNOSIS — Z780 Asymptomatic menopausal state: Secondary | ICD-10-CM

## 2023-01-06 DIAGNOSIS — R7303 Prediabetes: Secondary | ICD-10-CM | POA: Diagnosis not present

## 2023-01-06 DIAGNOSIS — F419 Anxiety disorder, unspecified: Secondary | ICD-10-CM

## 2023-01-06 DIAGNOSIS — Z114 Encounter for screening for human immunodeficiency virus [HIV]: Secondary | ICD-10-CM

## 2023-01-06 DIAGNOSIS — Z0001 Encounter for general adult medical examination with abnormal findings: Secondary | ICD-10-CM

## 2023-01-06 DIAGNOSIS — F32A Depression, unspecified: Secondary | ICD-10-CM

## 2023-01-06 MED ORDER — LISINOPRIL 10 MG PO TABS: 10.0000 mg | ORAL_TABLET | Freq: Every day | ORAL | 1 refills | Status: DC

## 2023-01-06 MED ORDER — BUPROPION HCL ER (XL) 150 MG PO TB24: 150.0000 mg | ORAL_TABLET | Freq: Every day | ORAL | 1 refills | Status: DC

## 2023-01-06 NOTE — Patient Instructions (Signed)
Health Maintenance for Postmenopausal Women Menopause is a normal process in which your ability to get pregnant comes to an end. This process happens slowly over many months or years, usually between the ages of 48 and 55. Menopause is complete when you have missed your menstrual period for 12 months. It is important to talk with your health care provider about some of the most common conditions that affect women after menopause (postmenopausal women). These include heart disease, cancer, and bone loss (osteoporosis). Adopting a healthy lifestyle and getting preventive care can help to promote your health and wellness. The actions you take can also lower your chances of developing some of these common conditions. What are the signs and symptoms of menopause? During menopause, you may have the following symptoms: Hot flashes. These can be moderate or severe. Night sweats. Decrease in sex drive. Mood swings. Headaches. Tiredness (fatigue). Irritability. Memory problems. Problems falling asleep or staying asleep. Talk with your health care provider about treatment options for your symptoms. Do I need hormone replacement therapy? Hormone replacement therapy is effective in treating symptoms that are caused by menopause, such as hot flashes and night sweats. Hormone replacement carries certain risks, especially as you become older. If you are thinking about using estrogen or estrogen with progestin, discuss the benefits and risks with your health care provider. How can I reduce my risk for heart disease and stroke? The risk of heart disease, heart attack, and stroke increases as you age. One of the causes may be a change in the body's hormones during menopause. This can affect how your body uses dietary fats, triglycerides, and cholesterol. Heart attack and stroke are medical emergencies. There are many things that you can do to help prevent heart disease and stroke. Watch your blood pressure High  blood pressure causes heart disease and increases the risk of stroke. This is more likely to develop in people who have high blood pressure readings or are overweight. Have your blood pressure checked: Every 3-5 years if you are 18-39 years of age. Every year if you are 40 years old or older. Eat a healthy diet  Eat a diet that includes plenty of vegetables, fruits, low-fat dairy products, and lean protein. Do not eat a lot of foods that are high in solid fats, added sugars, or sodium. Get regular exercise Get regular exercise. This is one of the most important things you can do for your health. Most adults should: Try to exercise for at least 150 minutes each week. The exercise should increase your heart rate and make you sweat (moderate-intensity exercise). Try to do strengthening exercises at least twice each week. Do these in addition to the moderate-intensity exercise. Spend less time sitting. Even light physical activity can be beneficial. Other tips Work with your health care provider to achieve or maintain a healthy weight. Do not use any products that contain nicotine or tobacco. These products include cigarettes, chewing tobacco, and vaping devices, such as e-cigarettes. If you need help quitting, ask your health care provider. Know your numbers. Ask your health care provider to check your cholesterol and your blood sugar (glucose). Continue to have your blood tested as directed by your health care provider. Do I need screening for cancer? Depending on your health history and family history, you may need to have cancer screenings at different stages of your life. This may include screening for: Breast cancer. Cervical cancer. Lung cancer. Colorectal cancer. What is my risk for osteoporosis? After menopause, you may be   at increased risk for osteoporosis. Osteoporosis is a condition in which bone destruction happens more quickly than new bone creation. To help prevent osteoporosis or  the bone fractures that can happen because of osteoporosis, you may take the following actions: If you are 19-50 years old, get at least 1,000 mg of calcium and at least 600 international units (IU) of vitamin D per day. If you are older than age 50 but younger than age 70, get at least 1,200 mg of calcium and at least 600 international units (IU) of vitamin D per day. If you are older than age 70, get at least 1,200 mg of calcium and at least 800 international units (IU) of vitamin D per day. Smoking and drinking excessive alcohol increase the risk of osteoporosis. Eat foods that are rich in calcium and vitamin D, and do weight-bearing exercises several times each week as directed by your health care provider. How does menopause affect my mental health? Depression may occur at any age, but it is more common as you become older. Common symptoms of depression include: Feeling depressed. Changes in sleep patterns. Changes in appetite or eating patterns. Feeling an overall lack of motivation or enjoyment of activities that you previously enjoyed. Frequent crying spells. Talk with your health care provider if you think that you are experiencing any of these symptoms. General instructions See your health care provider for regular wellness exams and vaccines. This may include: Scheduling regular health, dental, and eye exams. Getting and maintaining your vaccines. These include: Influenza vaccine. Get this vaccine each year before the flu season begins. Pneumonia vaccine. Shingles vaccine. Tetanus, diphtheria, and pertussis (Tdap) booster vaccine. Your health care provider may also recommend other immunizations. Tell your health care provider if you have ever been abused or do not feel safe at home. Summary Menopause is a normal process in which your ability to get pregnant comes to an end. This condition causes hot flashes, night sweats, decreased interest in sex, mood swings, headaches, or lack  of sleep. Treatment for this condition may include hormone replacement therapy. Take actions to keep yourself healthy, including exercising regularly, eating a healthy diet, watching your weight, and checking your blood pressure and blood sugar levels. Get screened for cancer and depression. Make sure that you are up to date with all your vaccines. This information is not intended to replace advice given to you by your health care provider. Make sure you discuss any questions you have with your health care provider. Document Revised: 01/14/2021 Document Reviewed: 01/14/2021 Elsevier Patient Education  2023 [employer]  

## 2023-01-06 NOTE — Progress Notes (Signed)
Subjective:    Patient ID: [name], female    DOB: Jun 19, 1961, 62 y.o.   MRN: [id number]  HPI  Patient presents to clinic today for her annual exam.  Flu: not this past year Tetanus: > 10 years ago COVID: Pfizer x 2 Shingrix: Never Pap smear: Hysterectomy Mammogram: 01/2022 Bone density: never Colon screening: 05/2015 Vision screening: as needed Dentist: biannually  Diet: She does eat meat. She consumes fruits and veggies. She does eat some fried foods. She drinks water, Dt. Soda. Exercise: None  Review of Systems     Past Medical History:  Diagnosis Date   Complication of anesthesia    per pt with d&c hysteroscopy done at [facility] 07-03-2017 felt dizzy, headaches, and just did not feel well   Diverticulosis of colon    Endometrial polyp    History of URI (upper respiratory infection) 08/25/2022   recent dx acute bacterial sinusitis and acute cough by pcp note in epic 08-25-2022 per pt had sympstoms for 1 wk prior to dx ,  pt stated completed antibiotic and prednisone on 12/ 25/ 2023   HSV infection    oral symptoms, uses valtrex for symptoms   Hypertension    Menorrhagia    Seasonal allergies    Thrombocytosis    Uterine fibroid     Current Outpatient Medications  Medication Sig Dispense Refill   BIOTIN PO Take 2,500 mcg by mouth daily.     Cholecalciferol (VITAMIN D3 PO) Take 2 capsules by mouth daily.     lisinopril (ZESTRIL) 10 MG tablet Take 1 tablet by mouth daily.     ondansetron (ZOFRAN-ODT) 4 MG disintegrating tablet Take 1 tablet (4 mg total) by mouth every 8 (eight) hours as needed for nausea or vomiting. 20 tablet 0   valACYclovir (VALTREX) 1000 MG tablet Take 1,000 mg by mouth daily as needed (fever blisters).     No current facility-administered medications for this visit.    Allergies  Allergen Reactions   Codeine Nausea Only   Morphine And Related Other (See Comments)    Headache   Latex Rash    Family History  Problem Relation Age  of Onset   Heart disease Mother    Kidney disease Mother    Hypertension Mother    Diabetes Mother    Colon cancer Brother 18   Pancreatic cancer Maternal Aunt        d. 48s   Lung cancer Maternal Uncle        d. 61s   Stomach cancer Paternal Uncle        d. >50   Liver cancer Paternal Uncle        d.>50   Lung cancer Paternal Uncle        d.>50   Diabetes Maternal Grandmother    Hypertension Maternal Grandmother    Heart disease Maternal Grandmother    Stroke Maternal Grandmother    Heart disease Maternal Grandfather    Cardiomyopathy Maternal Grandfather    Heart disease Paternal Grandmother    Stroke Paternal Grandmother    Hypertension Paternal Grandmother    Alzheimer's disease Paternal Grandfather    Colon cancer Cousin        double first cousin - d. late 3s   Colon cancer Cousin    Lung cancer Cousin    Esophageal cancer Neg Hx    Liver disease Neg Hx    Ovarian cancer Neg Hx    Breast cancer Neg Hx  Social History   Socioeconomic History   Marital status: Divorced    Spouse name: Not on file   Number of children: Not on file   Years of education: Not on file   Highest education level: Not on file  Occupational History   Not on file  Tobacco Use   Smoking status: Former    Packs/day: 0.50    Years: 15.00    Additional pack years: 0.00    Total pack years: 7.50    Types: Cigarettes    Quit date: 2006    Years since quitting: 18.3   Smokeless tobacco: Never  Vaping Use   Vaping Use: Never used  Substance and Sexual Activity   Alcohol use: Not Currently    Comment: very rare   Drug use: Never   Sexual activity: Yes    Birth control/protection: Post-menopausal  Other Topics Concern   Not on file  Social History Narrative   Not on file   Social Determinants of Health   Financial Resource Strain: Not on file  Food Insecurity: No Food Insecurity (09/25/2022)   Hunger Vital Sign    Worried About Running Out of Food in the Last Year: Never  true    Ran Out of Food in the Last Year: Never true  Transportation Needs: No Transportation Needs (09/25/2022)   PRAPARE - Administrator, Civil Service (Medical): No    Lack of Transportation (Non-Medical): No  Physical Activity: Not on file  Stress: Not on file  Social Connections: Not on file  Intimate Partner Violence: Not At Risk (09/25/2022)   Humiliation, Afraid, Rape, and Kick questionnaire    Fear of Current or Ex-Partner: No    Emotionally Abused: No    Physically Abused: No    Sexually Abused: No     Constitutional: Pt reports fatigue. Denies fever, malaise, headache or abrupt weight changes.  HEENT: Denies eye pain, eye redness, ear pain, ringing in the ears, wax buildup, runny nose, nasal congestion, bloody nose, or sore throat. Respiratory: Denies difficulty breathing, shortness of breath, cough or sputum production.   Cardiovascular: Denies chest pain, chest tightness, palpitations or swelling in the hands or feet.  Gastrointestinal: Patient reports intermittent constipation.  Denies abdominal pain, bloating, diarrhea or blood in the stool.  GU: Denies urgency, frequency, pain with urination, burning sensation, blood in urine, odor or discharge. Musculoskeletal: Denies decrease in range of motion, difficulty with gait, muscle pain or joint pain and swelling.  Skin: Denies redness, rashes, lesions or ulcercations.  Neurological: Pt reports lack of motivation, insomnia. Denies dizziness, difficulty with memory, difficulty with speech or problems with balance and coordination.  Psych: Pt reports anxiety and depression. Denies SI/HI.  No other specific complaints in a complete review of systems (except as listed in HPI above).  Objective:   Physical Exam   BP 138/86 (BP Location: Left Arm, Patient Position: Sitting, Cuff Size: Large)   Pulse (!) 102   Temp (!) 97.1 F (36.2 C) (Temporal)   Ht 5\' 2"  (1.575 m)   Wt 261 lb (118.4 kg)   SpO2 98%   BMI 47.74  kg/m   Wt Readings from Last 3 Encounters:  12/04/22 258 lb 12.8 oz (117.4 kg)  09/25/22 256 lb (116.1 kg)  09/24/22 256 lb (116.1 kg)    General: Appears her stated age, obese, in NAD. Skin: Warm, dry and intact.  HEENT: Head: normal shape and size; Eyes: sclera white, no icterus, conjunctiva pink, PERRLA and  EOMs intact;  Neck:  Neck supple, trachea midline. No masses, lumps or thyromegaly present.  Cardiovascular: Tachycardic with normal rhythm. S1,S2 noted.  No murmur, rubs or gallops noted. No JVD or BLE edema. No carotid bruits noted. Pulmonary/Chest: Normal effort and positive vesicular breath sounds. No respiratory distress. No wheezes, rales or ronchi noted.  Abdomen: Normal bowel sounds.  Musculoskeletal: Strength 5/5 BUE/BLE. No difficulty with gait.  Neurological: Alert and oriented. Cranial nerves II-XII grossly intact. Coordination normal.  Psychiatric: Mood and affect normal. Tearful. Judgment and thought content normal.    BMET    Component Value Date/Time   NA 135 09/26/2022 0546   NA 138 08/03/2019 0000   NA 142 12/11/2011 1941   K 4.5 09/26/2022 0546   K 4.1 12/11/2011 1941   CL 104 09/26/2022 0546   CL 104 12/11/2011 1941   CO2 23 09/26/2022 0546   CO2 27 12/11/2011 1941   GLUCOSE 116 (H) 09/26/2022 0546   GLUCOSE 83 12/11/2011 1941   BUN 22 09/26/2022 0546   BUN 19 08/03/2019 0000   BUN 20 (H) 12/11/2011 1941   CREATININE 0.95 09/26/2022 0546   CREATININE 0.81 12/11/2011 1941   CALCIUM 8.7 (L) 09/26/2022 0546   CALCIUM 9.1 12/11/2011 1941   GFRNONAA >60 09/26/2022 0546   GFRNONAA >60 12/11/2011 1941   GFRAA >60 02/18/2017 1109   GFRAA >60 12/11/2011 1941    Lipid Panel     Component Value Date/Time   CHOL 205 (A) 08/03/2019 0000   TRIG 91 08/03/2019 0000   HDL 66 08/03/2019 0000   LDLCALC 123 08/03/2019 0000    CBC    Component Value Date/Time   WBC 16.0 (H) 09/26/2022 0546   RBC 4.24 09/26/2022 0546   HGB 12.4 09/26/2022 0546   HGB  14.4 12/11/2011 1941   HCT 37.7 09/26/2022 0546   HCT 42.4 12/11/2011 1941   PLT 397 09/26/2022 0546   PLT 371 12/11/2011 1941   MCV 88.9 09/26/2022 0546   MCV 88 12/11/2011 1941   MCH 29.2 09/26/2022 0546   MCHC 32.9 09/26/2022 0546   RDW 12.9 09/26/2022 0546   RDW 13.3 12/11/2011 1941   LYMPHSABS 1.8 11/18/2021 1040   MONOABS 0.8 11/18/2021 1040   EOSABS 0.2 11/18/2021 1040   BASOSABS 0.1 11/18/2021 1040    Hgb A1C Lab Results  Component Value Date   HGBA1C 5.4 04/15/2018           Assessment & Plan:   Preventative Health Maintenance:  Encouraged her to get a flu shot in the fall Tetanus declined Encouraged her to get her COVID booster Discussed Shingrix vaccine, she will check coverage with her insurance company and schedule a visit if she would like to have this done She no longer needs Pap smears Mammogram and bone density ordered-she will call to schedule Colon screening UTD Encouraged her to consume a balanced diet and exercise regimen Advised her to see an eye doctor and dentist annually We will check CBC, c-Met, lipid, A1c, HIV and hep C today  RTC in 6 months, follow-up chronic conditions Nicki Reaper, NP

## 2023-01-06 NOTE — Assessment & Plan Note (Signed)
Situational We will start Wellbutrin 150 mg daily Support offered

## 2023-01-06 NOTE — Assessment & Plan Note (Signed)
Encourage diet and exercise for weight loss 

## 2023-01-12 ENCOUNTER — Telehealth: Payer: Self-pay | Admitting: *Deleted

## 2023-01-12 NOTE — Telephone Encounter (Signed)
Called pt today to check on her symptoms of bleeding that she notified the after hours line about yesterday, Sunday May 5th. In the morning.   Pt states she used the bathroom at work and noticed the toilet was full of blood that was a dark red to brownish color with no clots.  Pt stated she didn't have a bowel movement at the time and she only urinated and denied having any burning or pain on urination, also denied straining.  Pt also states when she wiped herself there was no blood at all on the tissue.   Pt denies any heavy lifting, exercise, intercourse and or nothing in vagina prior to the bleeding.   Pt states she had some pain that started Friday evening and describes the pain as a shooting "mild" pain in her left side of her pelvis. And some mild lower back pain.  This pain went away Sunday evening. Pt states she took motrin. Also denies fever or chills.   Pt denies taking any hormonal medications. As of this morning, pt states has not had anymore bleeding and the shooting pain is gone.   Pt advised to call the office with any new symptoms or concerns.

## 2023-01-13 NOTE — Telephone Encounter (Signed)
Pt was unable to be reached and voicemail left to call our office back at [phone].  Pt returned call. Message from Dr. Pricilla Holm relayed to patient. Pt states she is not having any more symptoms and is having no bleeding. Pt doesn't feel the need to make an appointment at this time, but will call the office if the bleeding or any other symptoms return.

## 2023-01-13 NOTE — Telephone Encounter (Signed)
You can offer her an appointment with exam if it would help reassure her.  If patient were to have any additional bleeding/symptoms, I'd like her to be seen.

## 2023-01-23 ENCOUNTER — Telehealth: Payer: Self-pay | Admitting: *Deleted

## 2023-01-23 NOTE — Telephone Encounter (Signed)
Patient called and stated "I have talked with my PCP because I have no energy or motivation. She suggested I talk with someone regarding hormone replacements. Do I need to discuss that with Dr Pricilla Holm or my regular GYN doctor." Explained that the office would call her on Monday.

## 2023-01-26 NOTE — Telephone Encounter (Signed)
Could someone call this patient today to check in? Thank you

## 2023-01-28 ENCOUNTER — Encounter: Payer: Self-pay | Admitting: Gynecologic Oncology

## 2023-01-28 ENCOUNTER — Inpatient Hospital Stay: Payer: Managed Care, Other (non HMO) | Attending: Gynecologic Oncology | Admitting: Gynecologic Oncology

## 2023-01-28 DIAGNOSIS — R5383 Other fatigue: Secondary | ICD-10-CM | POA: Diagnosis not present

## 2023-01-28 DIAGNOSIS — R4586 Emotional lability: Secondary | ICD-10-CM | POA: Diagnosis not present

## 2023-01-28 DIAGNOSIS — C541 Malignant neoplasm of endometrium: Secondary | ICD-10-CM

## 2023-01-28 NOTE — Progress Notes (Signed)
Gynecologic Oncology Telehealth Note: Gyn-Onc  I connected with Terri Gamble on 01/28/23 at  4:45 PM EDT by telephone and verified that I am speaking with the correct person using two identifiers.  I discussed the limitations, risks, security and privacy concerns of performing an evaluation and management service by telemedicine and the availability of in-person appointments. I also discussed with the patient that there may be a patient responsible charge related to this service. The patient expressed understanding and agreed to proceed.  Other persons participating in the visit and their role in the encounter: none.  Patient's location: home Provider's location: [facility]  Reason for Visit: follow-up, symptoms  Treatment History: Oncology History Overview Note  P53 wildtype   History of endometrial cancer  09/25/2022 Initial Diagnosis   Endometrial cancer (HCC)    IA1 grade 1 endometrioid endometrial adenocarcinoma. P53 WT. MMR intact.  Interval History: Lack of energy, motivation. Symptoms mostly postop. Had no symptoms with menopause. Started an anti depressant several weeks ago, has helped some with mood.   Past Medical/Surgical History: Past Medical History:  Diagnosis Date   Complication of anesthesia    per pt with d&c hysteroscopy done at [facility] 07-03-2017 felt dizzy, headaches, and just did not feel well   Diverticulosis of colon    Endometrial polyp    History of URI (upper respiratory infection) 08/25/2022   recent dx acute bacterial sinusitis and acute cough by pcp note in epic 08-25-2022 per pt had sympstoms for 1 wk prior to dx ,  pt stated completed antibiotic and prednisone on 12/ 25/ 2023   HSV infection    oral symptoms, uses valtrex for symptoms   Hypertension    Menorrhagia    Seasonal allergies    Thrombocytosis    Uterine fibroid     Past Surgical History:  Procedure Laterality Date   CESAREAN SECTION  1977, 1984   COLONOSCOPY WITH PROPOFOL   06/05/2015   dr stark   CYSTOSCOPY  09/25/2022   Procedure: CYSTOSCOPY;  Surgeon: Carver Fila, MD;  Location: [facility];  Service: Gynecology;;   DILATATION & CURETTAGE/HYSTEROSCOPY WITH MYOSURE N/A 07/03/2017   Procedure: DILATATION & CURETTAGE/HYSTEROSCOPY WITH MYOSURE;  Surgeon: Huel Cote, MD;  Location: [facility];  Service: Gynecology;  Laterality: N/A;  MD request 30 extra minutes   DILATATION & CURETTAGE/HYSTEROSCOPY WITH MYOSURE N/A 09/05/2022   Procedure: DILATATION & CURETTAGE/HYSTEROSCOPY WITH MYOSURE;  Surgeon: Huel Cote, MD;  Location: [facility];  Service: Gynecology;  Laterality: N/A;   HYSTEROSCOPY WITH D & C  09/04/2004   @[facility]  by dr Senaida Ores   INTRAUTERINE DEVICE (IUD) INSERTION N/A 09/05/2022   Procedure: INTRAUTERINE DEVICE (IUD) INSERTION;  Surgeon: Huel Cote, MD;  Location: [facility] ;  Service: Gynecology;  Laterality: N/A;   POLYPECTOMY  09/05/2022   ROBOTIC ASSISTED TOTAL HYSTERECTOMY WITH BILATERAL SALPINGO OOPHERECTOMY N/A 09/25/2022   Procedure: XI ROBOTIC ASSISTED TOTAL HYSTERECTOMY WITH BILATERAL SALPINGO OOPHORECTOMY LYSIS OF ADHESIONS;  Surgeon: Carver Fila, MD;  Location: [facility];  Service: Gynecology;  Laterality: N/A;   SENTINEL NODE BIOPSY N/A 09/25/2022   Procedure: SENTINEL NODE BIOPSY;  Surgeon: Carver Fila, MD;  Location: [facility];  Service: Gynecology;  Laterality: N/A;    Family History  Problem Relation Age of Onset   Heart disease Mother    Kidney disease Mother    Hypertension Mother    Diabetes Mother    Colon cancer Brother 20   Diabetes Maternal Grandmother  Hypertension Maternal Grandmother    Heart disease Maternal Grandmother    Stroke Maternal Grandmother    Heart disease Maternal Grandfather    Cardiomyopathy Maternal Grandfather    Heart disease Paternal Grandmother    Stroke Paternal Grandmother    Hypertension Paternal Grandmother    Alzheimer's disease  Paternal Grandfather    Pancreatic cancer Maternal Aunt        d. 10s   Lung cancer Maternal Uncle        d. 39s   Stomach cancer Paternal Uncle        d. >50   Liver cancer Paternal Uncle        d.>50   Lung cancer Paternal Uncle        d.>50   Colon cancer Cousin        double first cousin - d. late 70s   Colon cancer Cousin    Lung cancer Cousin    Esophageal cancer Neg Hx    Liver disease Neg Hx    Ovarian cancer Neg Hx    Breast cancer Neg Hx     Social History   Socioeconomic History   Marital status: Divorced    Spouse name: Not on file   Number of children: Not on file   Years of education: Not on file   Highest education level: Not on file  Occupational History   Not on file  Tobacco Use   Smoking status: Former    Packs/day: 0.50    Years: 15.00    Additional pack years: 0.00    Total pack years: 7.50    Types: Cigarettes    Quit date: 2006    Years since quitting: 18.4   Smokeless tobacco: Never  Vaping Use   Vaping Use: Never used  Substance and Sexual Activity   Alcohol use: Not Currently    Comment: very rare   Drug use: Never   Sexual activity: Yes    Birth control/protection: Post-menopausal  Other Topics Concern   Not on file  Social History Narrative   Not on file   Social Determinants of Health   Financial Resource Strain: Not on file  Food Insecurity: No Food Insecurity (09/25/2022)   Hunger Vital Sign    Worried About Running Out of Food in the Last Year: Never true    Ran Out of Food in the Last Year: Never true  Transportation Needs: No Transportation Needs (09/25/2022)   PRAPARE - Administrator, Civil Service (Medical): No    Lack of Transportation (Non-Medical): No  Physical Activity: Not on file  Stress: Not on file  Social Connections: Not on file    Current Medications:  Current Outpatient Medications:    BIOTIN PO, Take 2,500 mcg by mouth daily., Disp: , Rfl:    buPROPion (WELLBUTRIN XL) 150 MG 24 hr  tablet, Take 1 tablet (150 mg total) by mouth daily., Disp: 90 tablet, Rfl: 1   Cholecalciferol (VITAMIN D3 PO), Take 2 capsules by mouth daily., Disp: , Rfl:    lisinopril (ZESTRIL) 10 MG tablet, Take 1 tablet (10 mg total) by mouth daily., Disp: 90 tablet, Rfl: 1   ondansetron (ZOFRAN-ODT) 4 MG disintegrating tablet, Take 1 tablet (4 mg total) by mouth every 8 (eight) hours as needed for nausea or vomiting., Disp: 20 tablet, Rfl: 0   valACYclovir (VALTREX) 1000 MG tablet, Take 1,000 mg by mouth daily as needed (fever blisters)., Disp: , Rfl:   Review of Symptoms: Pertinent  positives as per HPI.  Physical Exam: Deferred given limitations of phone visit.  Laboratory & Radiologic Studies: None new  Assessment & Plan: Terri Gamble is a 62 y.o. woman with early stage low risk endometrial cancer with symptoms that may be related to hormonal change who presents for phone visit.  Discussed symptoms. Although she has been menopausal for 13 years prior to surgery, it is certainly possible that there was some mild change in hormonal levels after surgery that precipitate her symptoms. She has had some improvement on anti-depressant. We discussed started some low dose ERT to see if it helps - I offered that this could be in 2-4 weeks or now (since she just started anti-depressant). She'd like some time to think about things and will let me know.  I discussed the assessment and treatment plan with the patient. The patient was provided with an opportunity to ask questions and all were answered. The patient agreed with the plan and demonstrated an understanding of the instructions.   The patient was advised to call back or see an in-person evaluation if the symptoms worsen or if the condition fails to improve as anticipated.   8 minutes of total time was spent for this patient encounter, including preparation, phone counseling with the patient and coordination of care, and documentation of the  encounter.   Eugene Garnet, MD  Division of Gynecologic Oncology  Department of Obstetrics and Gynecology  [facility]

## 2023-02-05 ENCOUNTER — Telehealth: Payer: Self-pay | Admitting: *Deleted

## 2023-02-05 ENCOUNTER — Other Ambulatory Visit: Payer: Self-pay | Admitting: Gynecologic Oncology

## 2023-02-05 DIAGNOSIS — N951 Menopausal and female climacteric states: Secondary | ICD-10-CM

## 2023-02-05 MED ORDER — ESTRADIOL 0.025 MG/24HR TD PTTW: 1.0000 | MEDICATED_PATCH | TRANSDERMAL | 12 refills | Status: DC

## 2023-02-05 NOTE — Telephone Encounter (Signed)
Terri Gamble was called and relayed message from Dr. Eugene Garnet that Rx. For low dose estrogen patch was sent to her pharmacy. Patient thanked the office, no further concerns or questions at this time.

## 2023-02-05 NOTE — Telephone Encounter (Signed)
Please let the patient know low dose patch script sent to her pharmacy. Thank you

## 2023-02-05 NOTE — Telephone Encounter (Signed)
Ms. Pressman called the office to send a message to Dr. Pricilla Holm in regards to wanting to start on hormone replacement low dose estrogen.  Patient states she discussed this with Dr. Pricilla Holm and is ready to move forward with the plan of starting low dose ERT. Patient started on Bupropion about a month ago prescribed by her PCP and wanted to wait until she had been on that new medicine before starting. Advised patient that her message will be forwarded to Dr. Pricilla Holm. No further concerns or questions at this time.

## 2023-02-16 ENCOUNTER — Ambulatory Visit: Payer: Managed Care, Other (non HMO) | Admitting: Internal Medicine

## 2023-02-16 NOTE — Progress Notes (Deleted)
Subjective:    Patient ID: Terri Gamble, female    DOB: 01/21/1961, 62 y.o.   MRN: [id number]  HPI  Patient presents to clinic today to discuss her weight loss options.  Her current weight is LBS with a BMI of.  She has a history of prediabetes.  Review of Systems     Past Medical History:  Diagnosis Date   Complication of anesthesia    per pt with d&c hysteroscopy done at [facility] 07-03-2017 felt dizzy, headaches, and just did not feel well   Diverticulosis of colon    Endometrial polyp    History of URI (upper respiratory infection) 08/25/2022   recent dx acute bacterial sinusitis and acute cough by pcp note in epic 08-25-2022 per pt had sympstoms for 1 wk prior to dx ,  pt stated completed antibiotic and prednisone on 12/ 25/ 2023   HSV infection    oral symptoms, uses valtrex for symptoms   Hypertension    Menorrhagia    Seasonal allergies    Thrombocytosis    Uterine fibroid     Current Outpatient Medications  Medication Sig Dispense Refill   BIOTIN PO Take 2,500 mcg by mouth daily.     buPROPion (WELLBUTRIN XL) 150 MG 24 hr tablet Take 1 tablet (150 mg total) by mouth daily. 90 tablet 1   Cholecalciferol (VITAMIN D3 PO) Take 2 capsules by mouth daily.     estradiol (VIVELLE-DOT) 0.025 MG/24HR Place 1 patch onto the skin 2 (two) times a week. 8 patch 12   lisinopril (ZESTRIL) 10 MG tablet Take 1 tablet (10 mg total) by mouth daily. 90 tablet 1   ondansetron (ZOFRAN-ODT) 4 MG disintegrating tablet Take 1 tablet (4 mg total) by mouth every 8 (eight) hours as needed for nausea or vomiting. 20 tablet 0   valACYclovir (VALTREX) 1000 MG tablet Take 1,000 mg by mouth daily as needed (fever blisters).     No current facility-administered medications for this visit.    Allergies  Allergen Reactions   Codeine Nausea Only   Morphine And Codeine Other (See Comments)    Headache   Latex Rash    Family History  Problem Relation Age of Onset   Heart disease Mother     Kidney disease Mother    Hypertension Mother    Diabetes Mother    Colon cancer Brother 28   Diabetes Maternal Grandmother    Hypertension Maternal Grandmother    Heart disease Maternal Grandmother    Stroke Maternal Grandmother    Heart disease Maternal Grandfather    Cardiomyopathy Maternal Grandfather    Heart disease Paternal Grandmother    Stroke Paternal Grandmother    Hypertension Paternal Grandmother    Alzheimer's disease Paternal Grandfather    Pancreatic cancer Maternal Aunt        d. 22s   Lung cancer Maternal Uncle        d. 46s   Stomach cancer Paternal Uncle        d. >50   Liver cancer Paternal Uncle        d.>50   Lung cancer Paternal Uncle        d.>50   Colon cancer Cousin        double first cousin - d. late 18s   Colon cancer Cousin    Lung cancer Cousin    Esophageal cancer Neg Hx    Liver disease Neg Hx    Ovarian cancer Neg Hx  Breast cancer Neg Hx     Social History   Socioeconomic History   Marital status: Divorced    Spouse name: Not on file   Number of children: Not on file   Years of education: Not on file   Highest education level: Not on file  Occupational History   Not on file  Tobacco Use   Smoking status: Former    Packs/day: 0.50    Years: 15.00    Additional pack years: 0.00    Total pack years: 7.50    Types: Cigarettes    Quit date: 2006    Years since quitting: 18.4   Smokeless tobacco: Never  Vaping Use   Vaping Use: Never used  Substance and Sexual Activity   Alcohol use: Not Currently    Comment: very rare   Drug use: Never   Sexual activity: Yes    Birth control/protection: Post-menopausal  Other Topics Concern   Not on file  Social History Narrative   Not on file   Social Determinants of Health   Financial Resource Strain: Not on file  Food Insecurity: No Food Insecurity (09/25/2022)   Hunger Vital Sign    Worried About Running Out of Food in the Last Year: Never true    Ran Out of Food in the Last  Year: Never true  Transportation Needs: No Transportation Needs (09/25/2022)   PRAPARE - Administrator, Civil Service (Medical): No    Lack of Transportation (Non-Medical): No  Physical Activity: Not on file  Stress: Not on file  Social Connections: Not on file  Intimate Partner Violence: Not At Risk (09/25/2022)   Humiliation, Afraid, Rape, and Kick questionnaire    Fear of Current or Ex-Partner: No    Emotionally Abused: No    Physically Abused: No    Sexually Abused: No     Constitutional: Denies fever, malaise, fatigue, headache or abrupt weight changes.  HEENT: Denies eye pain, eye redness, ear pain, ringing in the ears, wax buildup, runny nose, nasal congestion, bloody nose, or sore throat. Respiratory: Denies difficulty breathing, shortness of breath, cough or sputum production.   Cardiovascular: Denies chest pain, chest tightness, palpitations or swelling in the hands or feet.  Gastrointestinal: Patient reports constipation.  Denies abdominal pain, bloating, diarrhea or blood in the stool.  GU: Denies urgency, frequency, pain with urination, burning sensation, blood in urine, odor or discharge. Musculoskeletal: Denies decrease in range of motion, difficulty with gait, muscle pain or joint pain and swelling.  Skin: Denies redness, rashes, lesions or ulcercations.  Neurological: Denies dizziness, difficulty with memory, difficulty with speech or problems with balance and coordination.  Psych: Patient has a history of anxiety and depression.  Denies SI/HI.  No other specific complaints in a complete review of systems (except as listed in HPI above).  Objective:   Physical Exam   There were no vitals taken for this visit. Wt Readings from Last 3 Encounters:  01/06/23 261 lb (118.4 kg)  12/04/22 258 lb 12.8 oz (117.4 kg)  09/25/22 256 lb (116.1 kg)    General: Appears their stated age, well developed, well nourished in NAD. Skin: Warm, dry and intact. No rashes,  lesions or ulcerations noted. HEENT: Head: normal shape and size; Eyes: sclera white, no icterus, conjunctiva pink, PERRLA and EOMs intact; Ears: Tm's gray and intact, normal light reflex; Nose: mucosa pink and moist, septum midline; Throat/Mouth: Teeth present, mucosa pink and moist, no exudate, lesions or ulcerations noted.  Neck:  Neck supple, trachea midline. No masses, lumps or thyromegaly present.  Cardiovascular: Normal rate and rhythm. S1,S2 noted.  No murmur, rubs or gallops noted. No JVD or BLE edema. No carotid bruits noted. Pulmonary/Chest: Normal effort and positive vesicular breath sounds. No respiratory distress. No wheezes, rales or ronchi noted.  Abdomen: Soft and nontender. Normal bowel sounds. No distention or masses noted. Liver, spleen and kidneys non palpable. Musculoskeletal: Normal range of motion. No signs of joint swelling. No difficulty with gait.  Neurological: Alert and oriented. Cranial nerves II-XII grossly intact. Coordination normal.  Psychiatric: Mood and affect normal. Behavior is normal. Judgment and thought content normal.     BMET    Component Value Date/Time   NA 135 09/26/2022 0546   NA 138 08/03/2019 0000   NA 142 12/11/2011 1941   K 4.5 09/26/2022 0546   K 4.1 12/11/2011 1941   CL 104 09/26/2022 0546   CL 104 12/11/2011 1941   CO2 23 09/26/2022 0546   CO2 27 12/11/2011 1941   GLUCOSE 116 (H) 09/26/2022 0546   GLUCOSE 83 12/11/2011 1941   BUN 22 09/26/2022 0546   BUN 19 08/03/2019 0000   BUN 20 (H) 12/11/2011 1941   CREATININE 0.95 09/26/2022 0546   CREATININE 0.81 12/11/2011 1941   CALCIUM 8.7 (L) 09/26/2022 0546   CALCIUM 9.1 12/11/2011 1941   GFRNONAA >60 09/26/2022 0546   GFRNONAA >60 12/11/2011 1941   GFRAA >60 02/18/2017 1109   GFRAA >60 12/11/2011 1941    Lipid Panel     Component Value Date/Time   CHOL 205 (A) 08/03/2019 0000   TRIG 91 08/03/2019 0000   HDL 66 08/03/2019 0000   LDLCALC 123 08/03/2019 0000    CBC     Component Value Date/Time   WBC 16.0 (H) 09/26/2022 0546   RBC 4.24 09/26/2022 0546   HGB 12.4 09/26/2022 0546   HGB 14.4 12/11/2011 1941   HCT 37.7 09/26/2022 0546   HCT 42.4 12/11/2011 1941   PLT 397 09/26/2022 0546   PLT 371 12/11/2011 1941   MCV 88.9 09/26/2022 0546   MCV 88 12/11/2011 1941   MCH 29.2 09/26/2022 0546   MCHC 32.9 09/26/2022 0546   RDW 12.9 09/26/2022 0546   RDW 13.3 12/11/2011 1941   LYMPHSABS 1.8 11/18/2021 1040   MONOABS 0.8 11/18/2021 1040   EOSABS 0.2 11/18/2021 1040   BASOSABS 0.1 11/18/2021 1040    Hgb A1C Lab Results  Component Value Date   HGBA1C 5.4 04/15/2018           Assessment & Plan:     RTC in 4 months for follow-up of chronic conditions Nicki Reaper, NP

## 2023-02-17 ENCOUNTER — Ambulatory Visit (INDEPENDENT_AMBULATORY_CARE_PROVIDER_SITE_OTHER): Payer: Managed Care, Other (non HMO) | Admitting: Internal Medicine

## 2023-02-17 ENCOUNTER — Encounter: Payer: Self-pay | Admitting: Internal Medicine

## 2023-02-17 VITALS — BP 130/86 | HR 118 | Temp 97.7°F | Ht 61.0 in | Wt 254.0 lb

## 2023-02-17 DIAGNOSIS — F32A Depression, unspecified: Secondary | ICD-10-CM

## 2023-02-17 DIAGNOSIS — R7303 Prediabetes: Secondary | ICD-10-CM | POA: Diagnosis not present

## 2023-02-17 DIAGNOSIS — F419 Anxiety disorder, unspecified: Secondary | ICD-10-CM

## 2023-02-17 DIAGNOSIS — Z0289 Encounter for other administrative examinations: Secondary | ICD-10-CM | POA: Diagnosis not present

## 2023-02-17 DIAGNOSIS — Z6841 Body Mass Index (BMI) 40.0 and over, adult: Secondary | ICD-10-CM

## 2023-02-17 DIAGNOSIS — I10 Essential (primary) hypertension: Secondary | ICD-10-CM | POA: Diagnosis not present

## 2023-02-17 MED ORDER — SEMAGLUTIDE-WEIGHT MANAGEMENT 0.5 MG/0.5ML ~~LOC~~ SOAJ: 0.5000 mg | SUBCUTANEOUS | 0 refills | Status: DC

## 2023-02-17 NOTE — Progress Notes (Addendum)
Subjective:    Patient ID: Terri Gamble, female    DOB: [birth year], 62 y.o.   MRN: [id number]  HPI  Pt presents to the clinic today to discuss her weight loss options. Her current weight is 254 lbs with a BMI of 47.99 . She has a history of HTN and prediabetes.  She is also requesting FMLA form completion. She would like this for anxiety and depression. She does feel like this is better since starting the Estrogen. She is taking Buproprion as prescribed. She is not currently seeing a therapist. She denies SI/HI.  Review of Systems     Past Medical History:  Diagnosis Date   Complication of anesthesia    per pt with d&c hysteroscopy done at [facility] 07-03-2017 felt dizzy, headaches, and just did not feel well   Diverticulosis of colon    Endometrial polyp    History of URI (upper respiratory infection) 08/25/2022   recent dx acute bacterial sinusitis and acute cough by pcp note in epic 08-25-2022 per pt had sympstoms for 1 wk prior to dx ,  pt stated completed antibiotic and prednisone on 12/ 25/ 2023   HSV infection    oral symptoms, uses valtrex for symptoms   Hypertension    Menorrhagia    Seasonal allergies    Thrombocytosis    Uterine fibroid     Current Outpatient Medications  Medication Sig Dispense Refill   BIOTIN PO Take 2,500 mcg by mouth daily.     buPROPion (WELLBUTRIN XL) 150 MG 24 hr tablet Take 1 tablet (150 mg total) by mouth daily. 90 tablet 1   Cholecalciferol (VITAMIN D3 PO) Take 2 capsules by mouth daily.     estradiol (VIVELLE-DOT) 0.025 MG/24HR Place 1 patch onto the skin 2 (two) times a week. 8 patch 12   lisinopril (ZESTRIL) 10 MG tablet Take 1 tablet (10 mg total) by mouth daily. 90 tablet 1   ondansetron (ZOFRAN-ODT) 4 MG disintegrating tablet Take 1 tablet (4 mg total) by mouth every 8 (eight) hours as needed for nausea or vomiting. 20 tablet 0   valACYclovir (VALTREX) 1000 MG tablet Take 1,000 mg by mouth daily as needed (fever blisters).     No  current facility-administered medications for this visit.    Allergies  Allergen Reactions   Codeine Nausea Only   Morphine And Codeine Other (See Comments)    Headache   Latex Rash    Family History  Problem Relation Age of Onset   Heart disease Mother    Kidney disease Mother    Hypertension Mother    Diabetes Mother    Colon cancer Brother 69   Diabetes Maternal Grandmother    Hypertension Maternal Grandmother    Heart disease Maternal Grandmother    Stroke Maternal Grandmother    Heart disease Maternal Grandfather    Cardiomyopathy Maternal Grandfather    Heart disease Paternal Grandmother    Stroke Paternal Grandmother    Hypertension Paternal Grandmother    Alzheimer's disease Paternal Grandfather    Pancreatic cancer Maternal Aunt        d. 33s   Lung cancer Maternal Uncle        d. 105s   Stomach cancer Paternal Uncle        d. >50   Liver cancer Paternal Uncle        d.>50   Lung cancer Paternal Uncle        d.>50   Colon cancer Cousin  double first cousin - d. late 44s   Colon cancer Cousin    Lung cancer Cousin    Esophageal cancer Neg Hx    Liver disease Neg Hx    Ovarian cancer Neg Hx    Breast cancer Neg Hx     Social History   Socioeconomic History   Marital status: Divorced    Spouse name: Not on file   Number of children: Not on file   Years of education: Not on file   Highest education level: Not on file  Occupational History   Not on file  Tobacco Use   Smoking status: Former    Packs/day: 0.50    Years: 15.00    Additional pack years: 0.00    Total pack years: 7.50    Types: Cigarettes    Quit date: 2006    Years since quitting: 18.4   Smokeless tobacco: Never  Vaping Use   Vaping Use: Never used  Substance and Sexual Activity   Alcohol use: Not Currently    Comment: very rare   Drug use: Never   Sexual activity: Yes    Birth control/protection: Post-menopausal  Other Topics Concern   Not on file  Social History  Narrative   Not on file   Social Determinants of Health   Financial Resource Strain: Not on file  Food Insecurity: No Food Insecurity (09/25/2022)   Hunger Vital Sign    Worried About Running Out of Food in the Last Year: Never true    Ran Out of Food in the Last Year: Never true  Transportation Needs: No Transportation Needs (09/25/2022)   PRAPARE - Administrator, Civil Service (Medical): No    Lack of Transportation (Non-Medical): No  Physical Activity: Not on file  Stress: Not on file  Social Connections: Not on file  Intimate Partner Violence: Not At Risk (09/25/2022)   Humiliation, Afraid, Rape, and Kick questionnaire    Fear of Current or Ex-Partner: No    Emotionally Abused: No    Physically Abused: No    Sexually Abused: No     Constitutional: Denies fever, malaise, fatigue, headache or abrupt weight changes.  Respiratory: Denies difficulty breathing, shortness of breath, cough or sputum production.   Cardiovascular: Denies chest pain, chest tightness, palpitations or swelling in the hands or feet.  Gastrointestinal: Patient reports constipation.  Denies abdominal pain, bloating, diarrhea or blood in the stool.  Musculoskeletal: Denies decrease in range of motion, difficulty with gait, muscle pain or joint pain and swelling.  Skin: Denies redness, rashes, lesions or ulcercations.  Neurological: Denies dizziness, difficulty with memory, difficulty with speech or problems with balance and coordination.  Psych: Patient has a history of anxiety and depression.  Denies SI/HI.  No other specific complaints in a complete review of systems (except as listed in HPI above).  Objective:   Physical Exam  BP 130/86 (BP Location: Right Arm, Patient Position: Sitting, Cuff Size: Large)   Pulse (!) 118   Temp 97.7 F (36.5 C) (Temporal)   Ht 5\' 1"  (1.549 m)   Wt 254 lb (115.2 kg)   SpO2 97%   BMI 47.99 kg/m   Wt Readings from Last 3 Encounters:  01/06/23 261 lb  (118.4 kg)  12/04/22 258 lb 12.8 oz (117.4 kg)  09/25/22 256 lb (116.1 kg)    General: Appears her stated age, obese, in NAD. Cardiovascular: Normal rate and rhythm.  Pulmonary/Chest: Normal effort and positive vesicular breath sounds. No respiratory  distress. No wheezes, rales or ronchi noted.  Musculoskeletal: No difficulty with gait.  Neurological: Alert and oriented.  Coordination normal.  Psychiatric: Mood and affect normal. Behavior is normal. Judgment and thought content normal.    BMET    Component Value Date/Time   NA 135 09/26/2022 0546   NA 138 08/03/2019 0000   NA 142 12/11/2011 1941   K 4.5 09/26/2022 0546   K 4.1 12/11/2011 1941   CL 104 09/26/2022 0546   CL 104 12/11/2011 1941   CO2 23 09/26/2022 0546   CO2 27 12/11/2011 1941   GLUCOSE 116 (H) 09/26/2022 0546   GLUCOSE 83 12/11/2011 1941   BUN 22 09/26/2022 0546   BUN 19 08/03/2019 0000   BUN 20 (H) 12/11/2011 1941   CREATININE 0.95 09/26/2022 0546   CREATININE 0.81 12/11/2011 1941   CALCIUM 8.7 (L) 09/26/2022 0546   CALCIUM 9.1 12/11/2011 1941   GFRNONAA >60 09/26/2022 0546   GFRNONAA >60 12/11/2011 1941   GFRAA >60 02/18/2017 1109   GFRAA >60 12/11/2011 1941    Lipid Panel     Component Value Date/Time   CHOL 205 (A) 08/03/2019 0000   TRIG 91 08/03/2019 0000   HDL 66 08/03/2019 0000   LDLCALC 123 08/03/2019 0000    CBC    Component Value Date/Time   WBC 16.0 (H) 09/26/2022 0546   RBC 4.24 09/26/2022 0546   HGB 12.4 09/26/2022 0546   HGB 14.4 12/11/2011 1941   HCT 37.7 09/26/2022 0546   HCT 42.4 12/11/2011 1941   PLT 397 09/26/2022 0546   PLT 371 12/11/2011 1941   MCV 88.9 09/26/2022 0546   MCV 88 12/11/2011 1941   MCH 29.2 09/26/2022 0546   MCHC 32.9 09/26/2022 0546   RDW 12.9 09/26/2022 0546   RDW 13.3 12/11/2011 1941   LYMPHSABS 1.8 11/18/2021 1040   MONOABS 0.8 11/18/2021 1040   EOSABS 0.2 11/18/2021 1040   BASOSABS 0.1 11/18/2021 1040    Hgb A1C Lab Results  Component  Value Date   HGBA1C 5.4 04/15/2018           Assessment & Plan:   RTC in 4 months for follow-up of chronic conditions Nicki Reaper, NP

## 2023-02-17 NOTE — Addendum Note (Signed)
Addended by: Lorre Munroe on: 02/17/2023 03:53 PM   Modules accepted: Orders

## 2023-02-17 NOTE — Patient Instructions (Signed)
Calorie Counting for Weight Loss Calories are units of energy. Your body needs a certain number of calories from food to keep going throughout the day. When you eat or drink more calories than your body needs, your body stores the extra calories mostly as fat. When you eat or drink fewer calories than your body needs, your body burns fat to get the energy it needs. Calorie counting means keeping track of how many calories you eat and drink each day. Calorie counting can be helpful if you need to lose weight. If you eat fewer calories than your body needs, you should lose weight. Ask your health care provider what a healthy weight is for you. For calorie counting to work, you will need to eat the right number of calories each day to lose a healthy amount of weight per week. A dietitian can help you figure out how many calories you need in a day and will suggest ways to reach your calorie goal. A healthy amount of weight to lose each week is usually 1-2 lb (0.5-0.9 kg). This usually means that your daily calorie intake should be reduced by 500-750 calories. Eating 1,200-1,500 calories a day can help most women lose weight. Eating 1,500-1,800 calories a day can help most men lose weight. What do I need to know about calorie counting? Work with your health care provider or dietitian to determine how many calories you should get each day. To meet your daily calorie goal, you will need to: Find out how many calories are in each food that you would like to eat. Try to do this before you eat. Decide how much of the food you plan to eat. Keep a food log. Do this by writing down what you ate and how many calories it had. To successfully lose weight, it is important to balance calorie counting with a healthy lifestyle that includes regular activity. Where do I find calorie information?  The number of calories in a food can be found on a Nutrition Facts label. If a food does not have a Nutrition Facts label, try  to look up the calories online or ask your dietitian for help. Remember that calories are listed per serving. If you choose to have more than one serving of a food, you will have to multiply the calories per serving by the number of servings you plan to eat. For example, the label on a package of bread might say that a serving size is 1 slice and that there are 90 calories in a serving. If you eat 1 slice, you will have eaten 90 calories. If you eat 2 slices, you will have eaten 180 calories. How do I keep a food log? After each time that you eat, record the following in your food log as soon as possible: What you ate. Be sure to include toppings, sauces, and other extras on the food. How much you ate. This can be measured in cups, ounces, or number of items. How many calories were in each food and drink. The total number of calories in the food you ate. Keep your food log near you, such as in a pocket-sized notebook or on an app or website on your mobile phone. Some programs will calculate calories for you and show you how many calories you have left to meet your daily goal. What are some portion-control tips? Know how many calories are in a serving. This will help you know how many servings you can have of a certain   food. Use a measuring cup to measure serving sizes. You could also try weighing out portions on a kitchen scale. With time, you will be able to estimate serving sizes for some foods. Take time to put servings of different foods on your favorite plates or in your favorite bowls and cups so you know what a serving looks like. Try not to eat straight from a food's packaging, such as from a bag or box. Eating straight from the package makes it hard to see how much you are eating and can lead to overeating. Put the amount you would like to eat in a cup or on a plate to make sure you are eating the right portion. Use smaller plates, glasses, and bowls for smaller portions and to prevent  overeating. Try not to multitask. For example, avoid watching TV or using your computer while eating. If it is time to eat, sit down at a table and enjoy your food. This will help you recognize when you are full. It will also help you be more mindful of what and how much you are eating. What are tips for following this plan? Reading food labels Check the calorie count compared with the serving size. The serving size may be smaller than what you are used to eating. Check the source of the calories. Try to choose foods that are high in protein, fiber, and vitamins, and low in saturated fat, trans fat, and sodium. Shopping Read nutrition labels while you shop. This will help you make healthy decisions about which foods to buy. Pay attention to nutrition labels for low-fat or fat-free foods. These foods sometimes have the same number of calories or more calories than the full-fat versions. They also often have added sugar, starch, or salt to make up for flavor that was removed with the fat. Make a grocery list of lower-calorie foods and stick to it. Cooking Try to cook your favorite foods in a healthier way. For example, try baking instead of frying. Use low-fat dairy products. Meal planning Use more fruits and vegetables. One-half of your plate should be fruits and vegetables. Include lean proteins, such as chicken, turkey, and fish. Lifestyle Each week, aim to do one of the following: 150 minutes of moderate exercise, such as walking. 75 minutes of vigorous exercise, such as running. General information Know how many calories are in the foods you eat most often. This will help you calculate calorie counts faster. Find a way of tracking calories that works for you. Get creative. Try different apps or programs if writing down calories does not work for you. What foods should I eat?  Eat nutritious foods. It is better to have a nutritious, high-calorie food, such as an avocado, than a food with  few nutrients, such as a bag of potato chips. Use your calories on foods and drinks that will fill you up and will not leave you hungry soon after eating. Examples of foods that fill you up are nuts and nut butters, vegetables, lean proteins, and high-fiber foods such as whole grains. High-fiber foods are foods with more than 5 g of fiber per serving. Pay attention to calories in drinks. Low-calorie drinks include water and unsweetened drinks. The items listed above may not be a complete list of foods and beverages you can eat. Contact a dietitian for more information. What foods should I limit? Limit foods or drinks that are not good sources of vitamins, minerals, or protein or that are high in unhealthy fats. These   include: Candy. Other sweets. Sodas, specialty coffee drinks, alcohol, and juice. The items listed above may not be a complete list of foods and beverages you should avoid. Contact a dietitian for more information. How do I count calories when eating out? Pay attention to portions. Often, portions are much larger when eating out. Try these tips to keep portions smaller: Consider sharing a meal instead of getting your own. If you get your own meal, eat only half of it. Before you start eating, ask for a container and put half of your meal into it. When available, consider ordering smaller portions from the menu instead of full portions. Pay attention to your food and drink choices. Knowing the way food is cooked and what is included with the meal can help you eat fewer calories. If calories are listed on the menu, choose the lower-calorie options. Choose dishes that include vegetables, fruits, whole grains, low-fat dairy products, and lean proteins. Choose items that are boiled, broiled, grilled, or steamed. Avoid items that are buttered, battered, fried, or served with cream sauce. Items labeled as crispy are usually fried, unless stated otherwise. Choose water, low-fat milk,  unsweetened iced tea, or other drinks without added sugar. If you want an alcoholic beverage, choose a lower-calorie option, such as a glass of wine or light beer. Ask for dressings, sauces, and syrups on the side. These are usually high in calories, so you should limit the amount you eat. If you want a salad, choose a garden salad and ask for grilled meats. Avoid extra toppings such as bacon, cheese, or fried items. Ask for the dressing on the side, or ask for olive oil and vinegar or lemon to use as dressing. Estimate how many servings of a food you are given. Knowing serving sizes will help you be aware of how much food you are eating at restaurants. Where to find more information Centers for Disease Control and Prevention: www.cdc.gov [place] Department of Agriculture: myplate.gov Summary Calorie counting means keeping track of how many calories you eat and drink each day. If you eat fewer calories than your body needs, you should lose weight. A healthy amount of weight to lose per week is usually 1-2 lb (0.5-0.9 kg). This usually means reducing your daily calorie intake by 500-750 calories. The number of calories in a food can be found on a Nutrition Facts label. If a food does not have a Nutrition Facts label, try to look up the calories online or ask your dietitian for help. Use smaller plates, glasses, and bowls for smaller portions and to prevent overeating. Use your calories on foods and drinks that will fill you up and not leave you hungry shortly after a meal. This information is not intended to replace advice given to you by your health care provider. Make sure you discuss any questions you have with your health care provider. Document Revised: 10/06/2019 Document Reviewed: 10/06/2019 Elsevier Patient Education  2023 [employer]  

## 2023-02-17 NOTE — Assessment & Plan Note (Signed)
Continue Buproprion Support offered Will complete FMLA forms for 4 days per month

## 2023-02-17 NOTE — Assessment & Plan Note (Addendum)
Will trial Wegovy ?Encouraged diet and exercise for weight loss ?

## 2023-02-18 ENCOUNTER — Telehealth: Payer: Self-pay | Admitting: Internal Medicine

## 2023-02-18 NOTE — Telephone Encounter (Signed)
Pt called in says med,Semaglutide-Weight Management 0.5 MG/0.5ML SOAJ , has been denied, PA has been denied. Please call back for furthre assistance.

## 2023-02-19 NOTE — Telephone Encounter (Signed)
Patient called back with her insurance company and they said because she is on [employer] and a member at [employer] a letter could be sent from the provider stating that to get pa on medication.

## 2023-02-19 NOTE — Telephone Encounter (Signed)
I have written a letter and placed this in your inbox for the appeal

## 2023-02-19 NOTE — Telephone Encounter (Signed)
Yes, but the appeal requires a letter of necessity.   Thanks,   -Vernona Rieger

## 2023-02-19 NOTE — Telephone Encounter (Signed)
Advised pt according to the denial letter,  she will need to try and 2-3 weight loss medications on her formulary.  She is going to call them and let us know what medicines they will cover.    Thanks,   -Vernona Rieger

## 2023-02-19 NOTE — Telephone Encounter (Signed)
We already got a prior auth and it was denied. Is she wanting to appeal this?

## 2023-02-20 ENCOUNTER — Encounter: Payer: Self-pay | Admitting: Internal Medicine

## 2023-02-24 NOTE — Telephone Encounter (Signed)
PA has been submitted.   Thanks,   -Caz Weaver  

## 2023-04-01 ENCOUNTER — Other Ambulatory Visit: Payer: Self-pay | Admitting: Internal Medicine

## 2023-04-02 NOTE — Telephone Encounter (Signed)
Unable to refill per protocol, Rx request is too soon. Last refill 01/06/23 for 90 and 1 refill.  Requested Prescriptions  Pending Prescriptions Disp Refills   lisinopril (ZESTRIL) 10 MG tablet [Pharmacy Med Name: LISINOPRIL 10MG  TABLETS] 90 tablet 1    Sig: TAKE 1 TABLET(10 MG) BY MOUTH DAILY     Cardiovascular:  ACE Inhibitors Failed - 04/01/2023  9:57 AM      Failed - Cr in normal range and within 180 days    Creatinine  Date Value Ref Range Status  12/11/2011 0.81 0.60 - 1.30 mg/dL Final   Creatinine, Ser  Date Value Ref Range Status  09/26/2022 0.95 0.44 - 1.00 mg/dL Final         Failed - K in normal range and within 180 days    Potassium  Date Value Ref Range Status  09/26/2022 4.5 3.5 - 5.1 mmol/L Final  12/11/2011 4.1 3.5 - 5.1 mmol/L Final         Passed - Patient is not pregnant      Passed - Last BP in normal range    BP Readings from Last 1 Encounters:  02/17/23 130/86         Passed - Valid encounter within last 6 months    Recent Outpatient Visits           1 month ago Prediabetes   [place] [facility] [place], Salvadore Oxford, NP   2 months ago Encounter for general adult medical examination with abnormal findings   [place] [facility] [place], Salvadore Oxford, NP   1 year ago Essential hypertension   [facility] [place], Salvadore Oxford, NP   1 year ago Essential hypertension   [place] [facility] [place], Salvadore Oxford, NP   1 year ago Viral URI with cough   [place] [facility] [place], Salvadore Oxford, NP       Future Appointments             In 3 months [place], Salvadore Oxford, NP [facility]

## 2023-07-06 ENCOUNTER — Telehealth: Payer: Self-pay | Admitting: Internal Medicine

## 2023-07-06 NOTE — Telephone Encounter (Signed)
Medication Refill - Medication: lisinopril (ZESTRIL) 10 MG tablet [[id number]]   Has the patient contacted their pharmacy? Yes.   (Agent: If no, request that the patient contact the pharmacy for the refill. If patient does not wish to contact the pharmacy document the reason why and proceed with request.) (Agent: If yes, when and what did the pharmacy advise?)  Preferred Pharmacy (with phone number or street name): [id number] [place], [place] - [address] AT [facility] [address] [facility] Phone: [phone]  Fax: [phone]   Has the patient been seen for an appointment in the last year OR does the patient have an upcoming appointment? Yes.    Agent: Please be advised that RX refills may take up to 3 business days. We ask that you follow-up with your pharmacy.

## 2023-07-07 MED ORDER — LISINOPRIL 10 MG PO TABS: 10.0000 mg | ORAL_TABLET | Freq: Every day | ORAL | 1 refills | Status: DC

## 2023-07-07 NOTE — Telephone Encounter (Signed)
Refill sent.

## 2023-07-10 ENCOUNTER — Ambulatory Visit: Payer: Managed Care, Other (non HMO) | Admitting: Internal Medicine

## 2023-07-20 ENCOUNTER — Ambulatory Visit: Payer: Managed Care, Other (non HMO) | Admitting: Internal Medicine

## 2023-08-14 LAB — HM MAMMOGRAPHY

## 2023-08-27 LAB — MOLECULAR PATHOLOGY

## 2023-09-03 ENCOUNTER — Telehealth: Payer: Self-pay

## 2023-09-03 NOTE — Telephone Encounter (Signed)
Received prior auth information for [facility]. Reached out to patient to clarify that she has been taking medication.

## 2023-09-30 ENCOUNTER — Other Ambulatory Visit: Payer: Self-pay | Admitting: Internal Medicine

## 2023-09-30 NOTE — Telephone Encounter (Signed)
Requested Prescriptions  Pending Prescriptions Disp Refills   buPROPion (WELLBUTRIN XL) 150 MG 24 hr tablet [Pharmacy Med Name: BUPROPION XL 150MG  TABLETS (24 H)] 90 tablet 1    Sig: TAKE 1 TABLET(150 MG) BY MOUTH DAILY     Psychiatry: Antidepressants - bupropion Failed - 09/30/2023 11:52 AM      Failed - Cr in normal range and within 360 days    Creatinine  Date Value Ref Range Status  12/11/2011 0.81 0.60 - 1.30 mg/dL Final   Creatinine, Ser  Date Value Ref Range Status  09/26/2022 0.95 0.44 - 1.00 mg/dL Final         Failed - AST in normal range and within 360 days    AST  Date Value Ref Range Status  09/24/2022 17 15 - 41 U/L Final         Failed - ALT in normal range and within 360 days    ALT  Date Value Ref Range Status  09/24/2022 20 0 - 44 U/L Final         Failed - Valid encounter within last 6 months    Recent Outpatient Visits           7 months ago Prediabetes   [facility] [place], Salvadore Oxford, NP   8 months ago Encounter for general adult medical examination with abnormal findings   [place] [facility] [place], Salvadore Oxford, NP   1 year ago Essential hypertension   [place] [facility] [place], Salvadore Oxford, NP   1 year ago Essential hypertension   [place] [facility] [place], Salvadore Oxford, NP   2 years ago Viral URI with cough   [place] [facility] [place], Salvadore Oxford, NP       Future Appointments             In 6 days [place], Salvadore Oxford, NP [place] [facility]   In 3 months [place], Salvadore Oxford, NP [place] [facility]            Passed - Completed PHQ-2 or PHQ-9 in the last 360 days      Passed - Last BP in normal range    BP Readings from Last 1 Encounters:  02/17/23 130/86

## 2023-10-06 ENCOUNTER — Ambulatory Visit: Payer: Managed Care, Other (non HMO) | Admitting: Internal Medicine

## 2023-10-06 NOTE — Progress Notes (Deleted)
 Subjective:    Patient ID: Terri Gamble, female    DOB: 1960-11-22, 63 y.o.   MRN: [id number]  HPI   Patient presents to clinic today for follow-up of chronic conditions.  HTN: Her BP today is.  She is taking lisinopril as prescribed.  ECG from 09/2022 reviewed.  Anxiety and depression: Chronic, managed on bupropion.  She is not currently seeing a therapist.  She denies SI/HI.  Prediabetes: Her last A1c was 5.6%, 04/2022.  She is not taking any oral diabetic medication at this time.  She does not check her sugars.  Chronic constipation: She takes as needed with some relief of symptoms.  Colonoscopy from 05/2015 reviewed.  History of endometrial cancer:  Review of Systems     Past Medical History:  Diagnosis Date   Complication of anesthesia    per pt with d&c hysteroscopy done at [facility] 07-03-2017 felt dizzy, headaches, and just did not feel well   Diverticulosis of colon    Endometrial polyp    History of URI (upper respiratory infection) 08/25/2022   recent dx acute bacterial sinusitis and acute cough by pcp note in epic 08-25-2022 per pt had sympstoms for 1 wk prior to dx ,  pt stated completed antibiotic and prednisone on 12/ 25/ 2023   HSV infection    oral symptoms, uses valtrex for symptoms   Hypertension    Menorrhagia    Seasonal allergies    Thrombocytosis    Uterine fibroid     Current Outpatient Medications  Medication Sig Dispense Refill   BIOTIN PO Take 2,500 mcg by mouth daily.     buPROPion (WELLBUTRIN XL) 150 MG 24 hr tablet Take 1 tablet (150 mg total) by mouth daily. 90 tablet 1   Cholecalciferol (VITAMIN D3 PO) Take 2 capsules by mouth daily.     estradiol (VIVELLE-DOT) 0.025 MG/24HR Place 1 patch onto the skin 2 (two) times a week. 8 patch 12   lisinopril (ZESTRIL) 10 MG tablet Take 1 tablet (10 mg total) by mouth daily. 90 tablet 1   ondansetron (ZOFRAN-ODT) 4 MG disintegrating tablet Take 1 tablet (4 mg total) by mouth every 8 (eight) hours as  needed for nausea or vomiting. 20 tablet 0   Semaglutide-Weight Management 0.5 MG/0.5ML SOAJ Inject 0.5 mg into the skin once a week. 6 mL 0   valACYclovir (VALTREX) 1000 MG tablet Take 1,000 mg by mouth daily as needed (fever blisters).     No current facility-administered medications for this visit.    Allergies  Allergen Reactions   Codeine Nausea Only   Morphine And Codeine Other (See Comments)    Headache   Latex Rash    Family History  Problem Relation Age of Onset   Heart disease Mother    Kidney disease Mother    Hypertension Mother    Diabetes Mother    Colon cancer Brother 78   Diabetes Maternal Grandmother    Hypertension Maternal Grandmother    Heart disease Maternal Grandmother    Stroke Maternal Grandmother    Heart disease Maternal Grandfather    Cardiomyopathy Maternal Grandfather    Heart disease Paternal Grandmother    Stroke Paternal Grandmother    Hypertension Paternal Grandmother    Alzheimer's disease Paternal Grandfather    Pancreatic cancer Maternal Aunt        d. 67s   Lung cancer Maternal Uncle        d. 9s   Stomach cancer Paternal Uncle  d. >50   Liver cancer Paternal Uncle        d.>50   Lung cancer Paternal Uncle        d.>50   Colon cancer Cousin        double first cousin - d. late 68s   Colon cancer Cousin    Lung cancer Cousin    Esophageal cancer Neg Hx    Liver disease Neg Hx    Ovarian cancer Neg Hx    Breast cancer Neg Hx     Social History   Socioeconomic History   Marital status: Divorced    Spouse name: Not on file   Number of children: Not on file   Years of education: Not on file   Highest education level: Not on file  Occupational History   Not on file  Tobacco Use   Smoking status: Former    Current packs/day: 0.00    Average packs/day: 0.5 packs/day for 15.0 years (7.5 ttl pk-yrs)    Types: Cigarettes    Start date: 63    Quit date: 2006    Years since quitting: 19.0   Smokeless tobacco: Never   Vaping Use   Vaping status: Never Used  Substance and Sexual Activity   Alcohol use: Not Currently    Comment: very rare   Drug use: Never   Sexual activity: Yes    Birth control/protection: Post-menopausal  Other Topics Concern   Not on file  Social History Narrative   Not on file   Social Drivers of Health   Financial Resource Strain: Not on file  Food Insecurity: No Food Insecurity (09/25/2022)   Hunger Vital Sign    Worried About Running Out of Food in the Last Year: Never true    Ran Out of Food in the Last Year: Never true  Transportation Needs: No Transportation Needs (09/25/2022)   PRAPARE - Administrator, Civil Service (Medical): No    Lack of Transportation (Non-Medical): No  Physical Activity: Not on file  Stress: Not on file  Social Connections: Not on file  Intimate Partner Violence: Not At Risk (09/25/2022)   Humiliation, Afraid, Rape, and Kick questionnaire    Fear of Current or Ex-Partner: No    Emotionally Abused: No    Physically Abused: No    Sexually Abused: No     Constitutional: Denies fatigue, fever, malaise, headache or abrupt weight changes.  HEENT: Denies eye pain, eye redness, ear pain, ringing in the ears, wax buildup, runny nose, nasal congestion, bloody nose, or sore throat. Respiratory: Denies difficulty breathing, shortness of breath, cough or sputum production.   Cardiovascular: Denies chest pain, chest tightness, palpitations or swelling in the hands or feet.  Gastrointestinal: Patient reports intermittent constipation.  Denies abdominal pain, bloating, diarrhea or blood in the stool.  GU: Denies urgency, frequency, pain with urination, burning sensation, blood in urine, odor or discharge. Musculoskeletal: Denies decrease in range of motion, difficulty with gait, muscle pain or joint pain and swelling.  Skin: Denies redness, rashes, lesions or ulcercations.  Neurological: Denies dizziness, difficulty with memory, difficulty with  speech or problems with balance and coordination.  Psych: Pt reports anxiety and depression. Denies SI/HI.  No other specific complaints in a complete review of systems (except as listed in HPI above).  Objective:   Physical Exam   There were no vitals taken for this visit.  Wt Readings from Last 3 Encounters:  02/17/23 254 lb (115.2 kg)  01/06/23 261  lb (118.4 kg)  12/04/22 258 lb 12.8 oz (117.4 kg)    General: Appears her stated age, obese, in NAD. Skin: Warm, dry and intact.  HEENT: Head: normal shape and size; Eyes: sclera white, no icterus, conjunctiva pink, PERRLA and EOMs intact;  Neck:  Neck supple, trachea midline. No masses, lumps or thyromegaly present.  Cardiovascular: Tachycardic with normal rhythm. S1,S2 noted.  No murmur, rubs or gallops noted. No JVD or BLE edema. No carotid bruits noted. Pulmonary/Chest: Normal effort and positive vesicular breath sounds. No respiratory distress. No wheezes, rales or ronchi noted.  Abdomen: Normal bowel sounds.  Musculoskeletal: Strength 5/5 BUE/BLE. No difficulty with gait.  Neurological: Alert and oriented. Cranial nerves II-XII grossly intact. Coordination normal.  Psychiatric: Mood and affect normal. Tearful. Judgment and thought content normal.    BMET    Component Value Date/Time   NA 135 09/26/2022 0546   NA 138 08/03/2019 0000   NA 142 12/11/2011 1941   K 4.5 09/26/2022 0546   K 4.1 12/11/2011 1941   CL 104 09/26/2022 0546   CL 104 12/11/2011 1941   CO2 23 09/26/2022 0546   CO2 27 12/11/2011 1941   GLUCOSE 116 (H) 09/26/2022 0546   GLUCOSE 83 12/11/2011 1941   BUN 22 09/26/2022 0546   BUN 19 08/03/2019 0000   BUN 20 (H) 12/11/2011 1941   CREATININE 0.95 09/26/2022 0546   CREATININE 0.81 12/11/2011 1941   CALCIUM 8.7 (L) 09/26/2022 0546   CALCIUM 9.1 12/11/2011 1941   GFRNONAA >60 09/26/2022 0546   GFRNONAA >60 12/11/2011 1941   GFRAA >60 02/18/2017 1109   GFRAA >60 12/11/2011 1941    Lipid Panel      Component Value Date/Time   CHOL 205 (A) 08/03/2019 0000   TRIG 91 08/03/2019 0000   HDL 66 08/03/2019 0000   LDLCALC 123 08/03/2019 0000    CBC    Component Value Date/Time   WBC 16.0 (H) 09/26/2022 0546   RBC 4.24 09/26/2022 0546   HGB 12.4 09/26/2022 0546   HGB 14.4 12/11/2011 1941   HCT 37.7 09/26/2022 0546   HCT 42.4 12/11/2011 1941   PLT 397 09/26/2022 0546   PLT 371 12/11/2011 1941   MCV 88.9 09/26/2022 0546   MCV 88 12/11/2011 1941   MCH 29.2 09/26/2022 0546   MCHC 32.9 09/26/2022 0546   RDW 12.9 09/26/2022 0546   RDW 13.3 12/11/2011 1941   LYMPHSABS 1.8 11/18/2021 1040   MONOABS 0.8 11/18/2021 1040   EOSABS 0.2 11/18/2021 1040   BASOSABS 0.1 11/18/2021 1040    Hgb A1C Lab Results  Component Value Date   HGBA1C 5.4 04/15/2018           Assessment & Plan:    RTC in 3 months for your annual exam Nicki Reaper, NP

## 2023-11-03 ENCOUNTER — Telehealth: Payer: Managed Care, Other (non HMO) | Admitting: Physician Assistant

## 2023-11-03 DIAGNOSIS — B9689 Other specified bacterial agents as the cause of diseases classified elsewhere: Secondary | ICD-10-CM

## 2023-11-03 DIAGNOSIS — J019 Acute sinusitis, unspecified: Secondary | ICD-10-CM | POA: Diagnosis not present

## 2023-11-03 MED ORDER — DOXYCYCLINE HYCLATE 100 MG PO TABS: 100.0000 mg | ORAL_TABLET | Freq: Two times a day (BID) | ORAL | 0 refills | Status: DC

## 2023-11-03 MED ORDER — FLUTICASONE PROPIONATE 50 MCG/ACT NA SUSP: 2.0000 | Freq: Every day | NASAL | 0 refills | Status: DC

## 2023-11-03 MED ORDER — BENZONATATE 100 MG PO CAPS: 100.0000 mg | ORAL_CAPSULE | Freq: Three times a day (TID) | ORAL | 0 refills | Status: DC | PRN

## 2023-11-03 NOTE — Patient Instructions (Signed)
 [name], thank you for joining Piedad Climes, PA-C for today's virtual visit.  While this provider is not your primary care provider (PCP), if your PCP is located in our provider database this encounter information will be shared with them immediately following your visit.   A [facility] MyChart account gives you access to today's visit and all your visits, tests, and labs performed at [facility] " click here if you don't have a [facility] MyChart account or go to [email or web address]  Consent: (Patient) [name] provided verbal consent for this virtual visit at the beginning of the encounter.  Current Medications:  Current Outpatient Medications:    BIOTIN PO, Take 2,500 mcg by mouth daily., Disp: , Rfl:    buPROPion (WELLBUTRIN XL) 150 MG 24 hr tablet, Take 1 tablet (150 mg total) by mouth daily., Disp: 90 tablet, Rfl: 1   Cholecalciferol (VITAMIN D3 PO), Take 2 capsules by mouth daily., Disp: , Rfl:    estradiol (VIVELLE-DOT) 0.025 MG/24HR, Place 1 patch onto the skin 2 (two) times a week., Disp: 8 patch, Rfl: 12   lisinopril (ZESTRIL) 10 MG tablet, Take 1 tablet (10 mg total) by mouth daily., Disp: 90 tablet, Rfl: 1   ondansetron (ZOFRAN-ODT) 4 MG disintegrating tablet, Take 1 tablet (4 mg total) by mouth every 8 (eight) hours as needed for nausea or vomiting., Disp: 20 tablet, Rfl: 0   Semaglutide-Weight Management 0.5 MG/0.5ML SOAJ, Inject 0.5 mg into the skin once a week., Disp: 6 mL, Rfl: 0   valACYclovir (VALTREX) 1000 MG tablet, Take 1,000 mg by mouth daily as needed (fever blisters)., Disp: , Rfl:    Medications ordered in this encounter:  No orders of the defined types were placed in this encounter.    *If you need refills on other medications prior to your next appointment, please contact your pharmacy*  Follow-Up: Call back or seek an in-person evaluation if the symptoms worsen or if the condition fails to improve as  anticipated.  [facility] [phone]  Other Instructions Please take antibiotic as directed.  Increase fluid intake.  Use Saline nasal spray.  Take a daily multivitamin. Use the Tessalon and Flonase as directed.  Place a humidifier in the bedroom.  Please call or return clinic if symptoms are not improving.  Sinusitis Sinusitis is redness, soreness, and swelling (inflammation) of the paranasal sinuses. Paranasal sinuses are air pockets within the bones of your face (beneath the eyes, the middle of the forehead, or above the eyes). In healthy paranasal sinuses, mucus is able to drain out, and air is able to circulate through them by way of your nose. However, when your paranasal sinuses are inflamed, mucus and air can become trapped. This can allow bacteria and other germs to grow and cause infection. Sinusitis can develop quickly and last only a short time (acute) or continue over a long period (chronic). Sinusitis that lasts for more than 12 weeks is considered chronic.  CAUSES  Causes of sinusitis include: Allergies. Structural abnormalities, such as displacement of the cartilage that separates your nostrils (deviated septum), which can decrease the air flow through your nose and sinuses and affect sinus drainage. Functional abnormalities, such as when the small hairs (cilia) that line your sinuses and help remove mucus do not work properly or are not present. SYMPTOMS  Symptoms of acute and chronic sinusitis are the same. The primary symptoms are pain and pressure around the affected sinuses. Other symptoms include: Upper  toothache. Earache. Headache. Bad breath. Decreased sense of smell and taste. A cough, which worsens when you are lying flat. Fatigue. Fever. Thick drainage from your nose, which often is green and may contain pus (purulent). Swelling and warmth over the affected sinuses. DIAGNOSIS  Your caregiver will perform a physical exam. During the exam, your  caregiver may: Look in your nose for signs of abnormal growths in your nostrils (nasal polyps). Tap over the affected sinus to check for signs of infection. View the inside of your sinuses (endoscopy) with a special imaging device with a light attached (endoscope), which is inserted into your sinuses. If your caregiver suspects that you have chronic sinusitis, one or more of the following tests may be recommended: Allergy tests. Nasal culture A sample of mucus is taken from your nose and sent to a lab and screened for bacteria. Nasal cytology A sample of mucus is taken from your nose and examined by your caregiver to determine if your sinusitis is related to an allergy. TREATMENT  Most cases of acute sinusitis are related to a viral infection and will resolve on their own within 10 days. Sometimes medicines are prescribed to help relieve symptoms (pain medicine, decongestants, nasal steroid sprays, or saline sprays).  However, for sinusitis related to a bacterial infection, your caregiver will prescribe antibiotic medicines. These are medicines that will help kill the bacteria causing the infection.  Rarely, sinusitis is caused by a fungal infection. In theses cases, your caregiver will prescribe antifungal medicine. For some cases of chronic sinusitis, surgery is needed. Generally, these are cases in which sinusitis recurs more than 3 times per year, despite other treatments. HOME CARE INSTRUCTIONS  Drink plenty of water. Water helps thin the mucus so your sinuses can drain more easily. Use a humidifier. Inhale steam 3 to 4 times a day (for example, sit in the bathroom with the shower running). Apply a warm, moist washcloth to your face 3 to 4 times a day, or as directed by your caregiver. Use saline nasal sprays to help moisten and clean your sinuses. Take over-the-counter or prescription medicines for pain, discomfort, or fever only as directed by your caregiver. SEEK IMMEDIATE MEDICAL CARE  IF: You have increasing pain or severe headaches. You have nausea, vomiting, or drowsiness. You have swelling around your face. You have vision problems. You have a stiff neck. You have difficulty breathing. MAKE SURE YOU:  Understand these instructions. Will watch your condition. Will get help right away if you are not doing well or get worse. Document Released: 08/25/2005 Document Revised: 11/17/2011 Document Reviewed: 09/09/2011 [facility] Patient Information 2014 [place], [place].    If you have been instructed to have an in-person evaluation today at a local Urgent Care facility, please use the link below. It will take you to a list of all of our available [facility], including address, phone number and hours of operation. Please do not delay care.  [facility]  If you or a family member do not have a primary care provider, use the link below to schedule a visit and establish care. When you choose a [facility] primary care physician or advanced practice provider, you gain a long-term partner in health. Find a Primary Care Provider  Learn more about [facility]'s in-office and virtual care options: [facility] - Get Care Now

## 2023-11-03 NOTE — Progress Notes (Signed)
 Virtual Visit Consent   Terri Gamble, you are scheduled for a virtual visit with a [facility] provider today. Just as with appointments in the office, your consent must be obtained to participate. Your consent will be active for this visit and any virtual visit you may have with one of our providers in the next 365 days. If you have a MyChart account, a copy of this consent can be sent to you electronically.  As this is a virtual visit, video technology does not allow for your provider to perform a traditional examination. This may limit your provider's ability to fully assess your condition. If your provider identifies any concerns that need to be evaluated in person or the need to arrange testing (such as labs, EKG, etc.), we will make arrangements to do so. Although advances in technology are sophisticated, we cannot ensure that it will always work on either your end or our end. If the connection with a video visit is poor, the visit may have to be switched to a telephone visit. With either a video or telephone visit, we are not always able to ensure that we have a secure connection.  By engaging in this virtual visit, you consent to the provision of healthcare and authorize for your insurance to be billed (if applicable) for the services provided during this visit. Depending on your insurance coverage, you may receive a charge related to this service.  I need to obtain your verbal consent now. Are you willing to proceed with your visit today? Terri Gamble has provided verbal consent on 11/03/2023 for a virtual visit (video or telephone). Piedad Climes, [place]  Date: 11/03/2023 12:00 PM   Virtual Visit via Video Note   I, Piedad Climes, connected with  Terri Gamble  ([id number], [birth year]) on 11/03/23 at 12:00 PM EST by a video-enabled telemedicine application and verified that I am speaking with the correct person using two identifiers.  Location: Patient:  Virtual Visit Location Patient: Home Provider: Virtual Visit Location Provider: Home Office   I discussed the limitations of evaluation and management by telemedicine and the availability of in person appointments. The patient expressed understanding and agreed to proceed.    History of Present Illness: Terri Gamble is a 63 y.o. who identifies as a female who was assigned female at birth, and is being seen today for URI symptoms starting last week with nasal congestion and sinus pressure. Since then has developed increasing congestion, ear pressure, aches and low-grade fever over the past day. Now with sinus pain and cough has become productive.  Denies recent travel. Some sick co-workers recently.   HPI: HPI  Problems:  Patient Active Problem List   Diagnosis Date Noted   Anxiety and depression 01/06/2023   History of endometrial cancer 09/25/2022   Prediabetes 02/21/2022   Class 3 severe obesity due to excess calories with body mass index (BMI) of 45.0 to 49.9 in adult [facility]) 12/10/2021   Constipation 11/18/2021   Essential hypertension 06/09/2017    Allergies:  Allergies  Allergen Reactions   Codeine Nausea Only   Morphine And Codeine Other (See Comments)    Headache   Latex Rash   Medications:  Current Outpatient Medications:    benzonatate (TESSALON) 100 MG capsule, Take 1 capsule (100 mg total) by mouth 3 (three) times daily as needed for cough., Disp: 30 capsule, Rfl: 0   doxycycline (VIBRA-TABS) 100 MG tablet, Take 1 tablet (100 mg total) by mouth 2 (two)  times daily., Disp: 20 tablet, Rfl: 0   fluticasone (FLONASE) 50 MCG/ACT nasal spray, Place 2 sprays into both nostrils daily., Disp: 16 g, Rfl: 0   BIOTIN PO, Take 2,500 mcg by mouth daily., Disp: , Rfl:    buPROPion (WELLBUTRIN XL) 150 MG 24 hr tablet, Take 1 tablet (150 mg total) by mouth daily., Disp: 90 tablet, Rfl: 1   Cholecalciferol (VITAMIN D3 PO), Take 2 capsules by mouth daily., Disp: , Rfl:    lisinopril  (ZESTRIL) 10 MG tablet, Take 1 tablet (10 mg total) by mouth daily., Disp: 90 tablet, Rfl: 1   ondansetron (ZOFRAN-ODT) 4 MG disintegrating tablet, Take 1 tablet (4 mg total) by mouth every 8 (eight) hours as needed for nausea or vomiting., Disp: 20 tablet, Rfl: 0   Semaglutide-Weight Management 0.5 MG/0.5ML SOAJ, Inject 0.5 mg into the skin once a week., Disp: 6 mL, Rfl: 0   valACYclovir (VALTREX) 1000 MG tablet, Take 1,000 mg by mouth daily as needed (fever blisters)., Disp: , Rfl:   Observations/Objective: Patient is well-developed, well-nourished in no acute distress.  Resting comfortably at home.  Head is normocephalic, atraumatic.  No labored breathing. Speech is clear and coherent with logical content.  Patient is alert and oriented at baseline.   Assessment and Plan: 1. Acute bacterial sinusitis (Primary) - benzonatate (TESSALON) 100 MG capsule; Take 1 capsule (100 mg total) by mouth 3 (three) times daily as needed for cough.  Dispense: 30 capsule; Refill: 0 - fluticasone (FLONASE) 50 MCG/ACT nasal spray; Place 2 sprays into both nostrils daily.  Dispense: 16 g; Refill: 0 - doxycycline (VIBRA-TABS) 100 MG tablet; Take 1 tablet (100 mg total) by mouth 2 (two) times daily.  Dispense: 20 tablet; Refill: 0  Rx Doxycycline.  Increase fluids.  Rest.  Saline nasal spray.  Probiotic.  Mucinex as directed.  Humidifier in bedroom. Flonase and Tessalon per orders.  Call or return to clinic if symptoms are not improving.   Follow Up Instructions: I discussed the assessment and treatment plan with the patient. The patient was provided an opportunity to ask questions and all were answered. The patient agreed with the plan and demonstrated an understanding of the instructions.  A copy of instructions were sent to the patient via MyChart unless otherwise noted below.   The patient was advised to call back or seek an in-person evaluation if the symptoms worsen or if the condition fails to improve as  anticipated.    Piedad Climes, PA-C

## 2023-11-11 ENCOUNTER — Telehealth: Payer: Self-pay

## 2023-11-11 NOTE — Telephone Encounter (Signed)
 Copied from CRM [id number]. Topic: Referral - Request for Referral >> Nov 11, 2023  3:15 PM Carlatta H wrote: Did the patient discuss referral with their provider in the last year? No (If No - schedule appointment) (If Yes - send message)  Appointment offered? No  Type of order/referral and detailed reason for visit: CT Scan for sinus  Preference of office, provider, location: [facility]   If referral order, have you been seen by this specialty before? No (If Yes, this issue or another issue? When? Where?  Can we respond through MyChart? Yes

## 2023-11-12 ENCOUNTER — Ambulatory Visit: Admitting: Internal Medicine

## 2023-11-12 ENCOUNTER — Other Ambulatory Visit: Payer: Self-pay

## 2023-11-12 ENCOUNTER — Encounter: Payer: Self-pay | Admitting: Internal Medicine

## 2023-11-12 VITALS — BP 138/88 | Ht 61.0 in | Wt 272.4 lb

## 2023-11-12 DIAGNOSIS — F32A Depression, unspecified: Secondary | ICD-10-CM

## 2023-11-12 DIAGNOSIS — R7303 Prediabetes: Secondary | ICD-10-CM | POA: Diagnosis not present

## 2023-11-12 DIAGNOSIS — I10 Essential (primary) hypertension: Secondary | ICD-10-CM | POA: Diagnosis not present

## 2023-11-12 DIAGNOSIS — F419 Anxiety disorder, unspecified: Secondary | ICD-10-CM

## 2023-11-12 DIAGNOSIS — Z136 Encounter for screening for cardiovascular disorders: Secondary | ICD-10-CM

## 2023-11-12 DIAGNOSIS — J320 Chronic maxillary sinusitis: Secondary | ICD-10-CM

## 2023-11-12 DIAGNOSIS — Z8542 Personal history of malignant neoplasm of other parts of uterus: Secondary | ICD-10-CM

## 2023-11-12 DIAGNOSIS — K5909 Other constipation: Secondary | ICD-10-CM

## 2023-11-12 MED ORDER — SEMAGLUTIDE-WEIGHT MANAGEMENT 0.25 MG/0.5ML ~~LOC~~ SOAJ: 0.2500 mg | SUBCUTANEOUS | 0 refills | Status: DC

## 2023-11-12 NOTE — Patient Instructions (Signed)

## 2023-11-12 NOTE — Assessment & Plan Note (Signed)
Stable off meds Support offered 

## 2023-11-12 NOTE — Telephone Encounter (Signed)
 She is going to need an appointment to discuss this.  She is past due for her follow-up anyway.  She was supposed to be seen October 2024

## 2023-11-12 NOTE — Assessment & Plan Note (Signed)
Controlled on losartan Reinforced DASH diet and exercise for weight loss C-Met today

## 2023-11-12 NOTE — Assessment & Plan Note (Signed)
 Encouraged high-fiber diet and adequate water intake Okay to take MiraLAX OTC as needed

## 2023-11-12 NOTE — Assessment & Plan Note (Signed)
 In remission She will continue to follow with oncology and gyn

## 2023-11-12 NOTE — Assessment & Plan Note (Signed)
 Encouraged low carb diet and exercise for weight loss A1c today Will send in Wegovy 0.25 mg weekly x 4 weeks

## 2023-11-12 NOTE — Progress Notes (Signed)
 Subjective:    Patient ID: Terri Gamble, female    DOB: 12-14-1960, 63 y.o.   MRN: [id number]  HPI  Patient presents the clinic today for follow-up of chronic conditions.  HTN: Her BP today is 138/88.  She is taking lisinopril as prescribed.  ECG from 09/2022 reviewed.  Anxiety and depression: Chronic, she is no longer taking bupropion because she did want to become dependent on medication.  She is not currently seeing a therapist.  She denies SI/HI.  Prediabetes: Her last A1c was 5.4%, 04/2018 but she reports she has one yearly for her biometric screening.  She is not taking any oral diabetic medication at this time.  She does not check her sugars.  Constipation: She is not taking anything for this.  Colonoscopy from 05/2015 reviewed.  History of endometrial cancer: In remission status post hysterectomy. She continues to follow with GYN and oncology.  She is also requesting referral for CT of the sinuses. She has had left maxillary sinus pain, swelling and tenderness for 1 week. She has been on 2 rounds of antibiotics and it does not seem to be helping. She has a slight runny nose. She was seen at [facility] who recommended she have a CT sinus.  Review of Systems     Past Medical History:  Diagnosis Date   Complication of anesthesia    per pt with d&c hysteroscopy done at [facility] 07-03-2017 felt dizzy, headaches, and just did not feel well   Diverticulosis of colon    Endometrial polyp    History of URI (upper respiratory infection) 08/25/2022   recent dx acute bacterial sinusitis and acute cough by pcp note in epic 08-25-2022 per pt had sympstoms for 1 wk prior to dx ,  pt stated completed antibiotic and prednisone on 12/ 25/ 2023   HSV infection    oral symptoms, uses valtrex for symptoms   Hypertension    Menorrhagia    Seasonal allergies    Thrombocytosis    Uterine fibroid     Current Outpatient Medications  Medication Sig Dispense Refill   benzonatate (TESSALON)  100 MG capsule Take 1 capsule (100 mg total) by mouth 3 (three) times daily as needed for cough. 30 capsule 0   BIOTIN PO Take 2,500 mcg by mouth daily.     buPROPion (WELLBUTRIN XL) 150 MG 24 hr tablet Take 1 tablet (150 mg total) by mouth daily. 90 tablet 1   Cholecalciferol (VITAMIN D3 PO) Take 2 capsules by mouth daily.     doxycycline (VIBRA-TABS) 100 MG tablet Take 1 tablet (100 mg total) by mouth 2 (two) times daily. 20 tablet 0   fluticasone (FLONASE) 50 MCG/ACT nasal spray Place 2 sprays into both nostrils daily. 16 g 0   lisinopril (ZESTRIL) 10 MG tablet Take 1 tablet (10 mg total) by mouth daily. 90 tablet 1   ondansetron (ZOFRAN-ODT) 4 MG disintegrating tablet Take 1 tablet (4 mg total) by mouth every 8 (eight) hours as needed for nausea or vomiting. 20 tablet 0   Semaglutide-Weight Management 0.5 MG/0.5ML SOAJ Inject 0.5 mg into the skin once a week. 6 mL 0   valACYclovir (VALTREX) 1000 MG tablet Take 1,000 mg by mouth daily as needed (fever blisters).     No current facility-administered medications for this visit.    Allergies  Allergen Reactions   Codeine Nausea Only   Morphine And Codeine Other (See Comments)    Headache   Latex Rash  Family History  Problem Relation Age of Onset   Heart disease Mother    Kidney disease Mother    Hypertension Mother    Diabetes Mother    Colon cancer Brother 21   Diabetes Maternal Grandmother    Hypertension Maternal Grandmother    Heart disease Maternal Grandmother    Stroke Maternal Grandmother    Heart disease Maternal Grandfather    Cardiomyopathy Maternal Grandfather    Heart disease Paternal Grandmother    Stroke Paternal Grandmother    Hypertension Paternal Grandmother    Alzheimer's disease Paternal Grandfather    Pancreatic cancer Maternal Aunt        d. 35s   Lung cancer Maternal Uncle        d. 90s   Stomach cancer Paternal Uncle        d. >50   Liver cancer Paternal Uncle        d.>50   Lung cancer  Paternal Uncle        d.>50   Colon cancer Cousin        double first cousin - d. late 69s   Colon cancer Cousin    Lung cancer Cousin    Esophageal cancer Neg Hx    Liver disease Neg Hx    Ovarian cancer Neg Hx    Breast cancer Neg Hx     Social History   Socioeconomic History   Marital status: Divorced    Spouse name: Not on file   Number of children: Not on file   Years of education: Not on file   Highest education level: Not on file  Occupational History   Not on file  Tobacco Use   Smoking status: Former    Current packs/day: 0.00    Average packs/day: 0.5 packs/day for 15.0 years (7.5 ttl pk-yrs)    Types: Cigarettes    Start date: 49    Quit date: 2006    Years since quitting: 19.1   Smokeless tobacco: Never  Vaping Use   Vaping status: Never Used  Substance and Sexual Activity   Alcohol use: Not Currently    Comment: very rare   Drug use: Never   Sexual activity: Yes    Birth control/protection: Post-menopausal  Other Topics Concern   Not on file  Social History Narrative   Not on file   Social Drivers of Health   Financial Resource Strain: Not on file  Food Insecurity: No Food Insecurity (09/25/2022)   Hunger Vital Sign    Worried About Running Out of Food in the Last Year: Never true    Ran Out of Food in the Last Year: Never true  Transportation Needs: No Transportation Needs (09/25/2022)   PRAPARE - Administrator, Civil Service (Medical): No    Lack of Transportation (Non-Medical): No  Physical Activity: Not on file  Stress: Not on file  Social Connections: Not on file  Intimate Partner Violence: Not At Risk (09/25/2022)   Humiliation, Afraid, Rape, and Kick questionnaire    Fear of Current or Ex-Partner: No    Emotionally Abused: No    Physically Abused: No    Sexually Abused: No     Constitutional:  Denies fatigue, fever, malaise, headache or abrupt weight changes.  HEENT: Pt reports left maxillary sinus pain and swelling.  Denies eye pain, eye redness, ear pain, ringing in the ears, wax buildup, runny nose, nasal congestion, bloody nose, or sore throat. Respiratory: Denies difficulty breathing, shortness of breath,  cough or sputum production.   Cardiovascular: Denies chest pain, chest tightness, palpitations or swelling in the hands or feet.  Gastrointestinal: Patient reports intermittent constipation.  Denies abdominal pain, bloating, diarrhea or blood in the stool.  GU: Denies urgency, frequency, pain with urination, burning sensation, blood in urine, odor or discharge. Musculoskeletal: Denies decrease in range of motion, difficulty with gait, muscle pain or joint pain and swelling.  Skin: Denies redness, rashes, lesions or ulcercations.  Neurological: Denies dizziness, difficulty with memory, difficulty with speech or problems with balance and coordination.  Psych: Pt has a history of anxiety and depression. Denies SI/HI.  No other specific complaints in a complete review of systems (except as listed in HPI above).  Objective:   Physical Exam   BP 138/88 (BP Location: Left Arm, Patient Position: Sitting, Cuff Size: Large)   Ht 5\' 1"  (1.549 m)   Wt 272 lb 6.4 oz (123.6 kg)   BMI 51.47 kg/m    Wt Readings from Last 3 Encounters:  02/17/23 254 lb (115.2 kg)  01/06/23 261 lb (118.4 kg)  12/04/22 258 lb 12.8 oz (117.4 kg)    General: Appears her stated age, obese, in NAD. Skin: Warm, dry and intact.  HEENT: Head: normal shape and size, left maxillary sinus tenderness noted ; Eyes: sclera white, no icterus, conjunctiva pink, PERRLA and EOMs intact;  Cardiovascular: Normal rate and rhythm. S1,S2 noted.  No murmur, rubs or gallops noted. No JVD or BLE edema. No carotid bruits noted. Pulmonary/Chest: Normal effort and positive vesicular breath sounds. No respiratory distress. No wheezes, rales or ronchi noted.  Abdomen: Normal bowel sounds.  Musculoskeletal: No difficulty with gait.  Neurological: Alert  and oriented. Coordination normal.  Psychiatric: Mood and affect normal. Tearful. Judgment and thought content normal.    BMET    Component Value Date/Time   NA 135 09/26/2022 0546   NA 138 08/03/2019 0000   NA 142 12/11/2011 1941   K 4.5 09/26/2022 0546   K 4.1 12/11/2011 1941   CL 104 09/26/2022 0546   CL 104 12/11/2011 1941   CO2 23 09/26/2022 0546   CO2 27 12/11/2011 1941   GLUCOSE 116 (H) 09/26/2022 0546   GLUCOSE 83 12/11/2011 1941   BUN 22 09/26/2022 0546   BUN 19 08/03/2019 0000   BUN 20 (H) 12/11/2011 1941   CREATININE 0.95 09/26/2022 0546   CREATININE 0.81 12/11/2011 1941   CALCIUM 8.7 (L) 09/26/2022 0546   CALCIUM 9.1 12/11/2011 1941   GFRNONAA >60 09/26/2022 0546   GFRNONAA >60 12/11/2011 1941   GFRAA >60 02/18/2017 1109   GFRAA >60 12/11/2011 1941    Lipid Panel     Component Value Date/Time   CHOL 205 (A) 08/03/2019 0000   TRIG 91 08/03/2019 0000   HDL 66 08/03/2019 0000   LDLCALC 123 08/03/2019 0000    CBC    Component Value Date/Time   WBC 16.0 (H) 09/26/2022 0546   RBC 4.24 09/26/2022 0546   HGB 12.4 09/26/2022 0546   HGB 14.4 12/11/2011 1941   HCT 37.7 09/26/2022 0546   HCT 42.4 12/11/2011 1941   PLT 397 09/26/2022 0546   PLT 371 12/11/2011 1941   MCV 88.9 09/26/2022 0546   MCV 88 12/11/2011 1941   MCH 29.2 09/26/2022 0546   MCHC 32.9 09/26/2022 0546   RDW 12.9 09/26/2022 0546   RDW 13.3 12/11/2011 1941   LYMPHSABS 1.8 11/18/2021 1040   MONOABS 0.8 11/18/2021 1040   EOSABS 0.2 11/18/2021 1040  BASOSABS 0.1 11/18/2021 1040    Hgb A1C Lab Results  Component Value Date   HGBA1C 5.4 04/15/2018           Assessment & Plan:   Left maxillary sinusitis:  Will obtain CT sinus for further evaluation Consider referral to ENT if symptoms persist or worsen  RTC in 6 months for your annual exam Nicki Reaper, NP

## 2023-11-12 NOTE — Assessment & Plan Note (Signed)
Will trial Wegovy ?Encouraged diet and exercise for weight loss ?

## 2023-11-12 NOTE — Telephone Encounter (Signed)
 Patient scheduled with Rene Kocher today for follow up visit and discuss CT further.

## 2023-11-13 ENCOUNTER — Telehealth: Payer: Self-pay

## 2023-11-13 NOTE — Telephone Encounter (Signed)
 Evorn Gong (Key: [id number]) Rx #: [id number] [phone] 0.25MG /0.5ML auto-injectors Form [facility] Electronic Prior Authorization Form (2017 NCPDP) Created 24 hours ago Sent to Plan 6 minutes ago Plan Response 5 minutes ago Submit Clinical Questions less than a minute ago Determination Wait for Determination Please wait for [facility] 2017 NCPDP to return a determination.

## 2023-11-16 NOTE — Telephone Encounter (Signed)
 Terri Gamble (Terri Gamble) Rx #: [id number] [phone] 0.25MG /0.5ML auto-injectors Form [facility] Electronic Prior Authorization Form (2017 NCPDP) Created 4 days ago Sent to Plan 3 days ago Plan Response 3 days ago Submit Clinical Questions 3 days ago Determination Unfavorable 3 days ago eAppeal Submitted eAppeal Determination Your prior authorization request has been denied. Complete E-Appeal Your request for prior authorization was denied, but an Electronic Appeal is available for your patient. Complete the questions in the Appeal section at the bottom of this page to pursue the appeal. For assistance, contact our support team at [phone].  Message from plan: Request Reference Number: [id number]. WEGOVY INJ 0.25MG  is denied for not meeting the prior authorization requirement(s). Details of this decision are in the notice attached below or have been faxed to you.

## 2023-11-16 NOTE — Telephone Encounter (Signed)
 DECISION NOTES AND DETAILS Here is a breakdown of why this request was denied. It includes the criteria used to make the decision. The provider is also getting a copy of this information. If they would like, they can call us to set up a time to talk through this decision with another provider, called a peer-to-peer review.  You may want to bring this letter with you on your next visit. The information can be very detailed. The doctor may be able to help you understand it. The denial was based on our criteria for Wegovy Inj 0.25mg .  Per your health plan's criteria, this drug is covered if you meet the following: (1) One of the following: (A) You have been receiving Wegovy therapy for up to 6 months and have had a weight loss of more than or equal to 5% of baseline body weight. (B) You have been receiving Wegovy therapy for more than 6 months and are continuing to experience or maintain weight loss. (2) Both of the following: (A) You have been following the treatment for four months in a row. (B) One of the following: (I) You are currently on a maintenance dose of 1.7mg  or 2.4mg  once weekly. (II) You have received less than 6 months of treatment with [facility] and are continuing to adjust to a target dose of 1.7mg  or 2.4mg  once weekly. The information provided does not show that you meet the criteria listed above. Please speak with your doctor about your options. Reviewed by: R.Ph. We based this decision on clinical guidelines for Wegovy Inj 0.25mg . You can obtain a free copy of these guidelines by writing: [facility] c/o Prior Authorization Guidelines [address] [place], [place] [address] You should share a copy of this decision with your doctor so you and your doctor can discuss next steps. If your doctor requested coverage on your behalf, we have sent a copy of this decision to your doctor. This denial is based on our Wegovy Inj 0.25mg  drug coverage policy, in addition to any  supplementary information you or your prescriber may have submitted

## 2023-11-17 ENCOUNTER — Encounter: Payer: Self-pay | Admitting: Internal Medicine

## 2023-11-20 ENCOUNTER — Inpatient Hospital Stay: Admission: RE | Admit: 2023-11-20 | Source: Ambulatory Visit

## 2023-12-29 ENCOUNTER — Other Ambulatory Visit: Payer: Self-pay | Admitting: Internal Medicine

## 2023-12-30 NOTE — Telephone Encounter (Signed)
 Requested Prescriptions  Pending Prescriptions Disp Refills   lisinopril  (ZESTRIL ) 10 MG tablet [Pharmacy Med Name: LISINOPRIL  10MG  TABLETS] 90 tablet 0    Sig: TAKE 1 TABLET(10 MG) BY MOUTH DAILY     Cardiovascular:  ACE Inhibitors Failed - 12/30/2023  9:34 AM      Failed - Cr in normal range and within 180 days    Creatinine  Date Value Ref Range Status  12/11/2011 0.81 0.60 - 1.30 mg/dL Final   Creatinine, Ser  Date Value Ref Range Status  09/26/2022 0.95 0.44 - 1.00 mg/dL Final         Failed - K in normal range and within 180 days    Potassium  Date Value Ref Range Status  09/26/2022 4.5 3.5 - 5.1 mmol/L Final  12/11/2011 4.1 3.5 - 5.1 mmol/L Final         Failed - Valid encounter within last 6 months    Recent Outpatient Visits           1 month ago Prediabetes   [facility] [place], Rankin Buzzard, NP       Future Appointments             In 2 weeks Carollynn Cirri, NP [facility], [facility]            Passed - Patient is not pregnant      Passed - Last BP in normal range    BP Readings from Last 1 Encounters:  11/12/23 138/88

## 2024-01-13 ENCOUNTER — Encounter: Payer: Self-pay | Admitting: Internal Medicine

## 2024-02-11 ENCOUNTER — Encounter: Admitting: Internal Medicine

## 2024-02-11 NOTE — Progress Notes (Deleted)
 Subjective:    Patient ID: Terri Gamble, female    DOB: February 28, 1961, 63 y.o.   MRN: [id number]  HPI  Patient presents to clinic today for her annual exam.  Flu: not this past year Tetanus: > 10 years ago COVID: Pfizer x 2 Shingrix: Never Pap smear: Hysterectomy Mammogram: 01/2022 Bone density: never Colon screening: 05/2015 Vision screening: as needed Dentist: biannually  Diet: She does eat meat. She consumes fruits and veggies. She does eat some fried foods. She drinks water , Dt. Soda. Exercise: None  Review of Systems     Past Medical History:  Diagnosis Date   Complication of anesthesia    per pt with d&c hysteroscopy done at [facility] 07-03-2017 felt dizzy, headaches, and just did not feel well   Diverticulosis of colon    Endometrial polyp    History of URI (upper respiratory infection) 08/25/2022   recent dx acute bacterial sinusitis and acute cough by pcp note in epic 08-25-2022 per pt had sympstoms for 1 wk prior to dx ,  pt stated completed antibiotic and prednisone  on 12/ 25/ 2023   HSV infection    oral symptoms, uses valtrex for symptoms   Hypertension    Menorrhagia    Seasonal allergies    Thrombocytosis    Uterine fibroid     Current Outpatient Medications  Medication Sig Dispense Refill   BIOTIN PO Take 2,500 mcg by mouth daily.     Cholecalciferol (VITAMIN D3 PO) Take 2 capsules by mouth daily.     fluticasone  (FLONASE ) 50 MCG/ACT nasal spray Place 2 sprays into both nostrils daily. 16 g 0   lisinopril  (ZESTRIL ) 10 MG tablet TAKE 1 TABLET(10 MG) BY MOUTH DAILY 90 tablet 0   Semaglutide -Weight Management 0.25 MG/0.5ML SOAJ Inject 0.25 mg into the skin once a week. 2 mL 0   valACYclovir (VALTREX) 1000 MG tablet Take 1,000 mg by mouth daily as needed (fever blisters).     No current facility-administered medications for this visit.    Allergies  Allergen Reactions   Codeine Nausea Only   Morphine  And Codeine Other (See Comments)    Headache    Latex Rash    Family History  Problem Relation Age of Onset   Heart disease Mother    Kidney disease Mother    Hypertension Mother    Diabetes Mother    Colon cancer Brother 67   Diabetes Maternal Grandmother    Hypertension Maternal Grandmother    Heart disease Maternal Grandmother    Stroke Maternal Grandmother    Heart disease Maternal Grandfather    Cardiomyopathy Maternal Grandfather    Heart disease Paternal Grandmother    Stroke Paternal Grandmother    Hypertension Paternal Grandmother    Alzheimer's disease Paternal Grandfather    Pancreatic cancer Maternal Aunt        d. 39s   Lung cancer Maternal Uncle        d. 23s   Stomach cancer Paternal Uncle        d. >50   Liver cancer Paternal Uncle        d.>50   Lung cancer Paternal Uncle        d.>50   Colon cancer Cousin        double first cousin - d. late 60s   Colon cancer Cousin    Lung cancer Cousin    Esophageal cancer Neg Hx    Liver disease Neg Hx    Ovarian cancer Neg Hx  Breast cancer Neg Hx     Social History   Socioeconomic History   Marital status: Divorced    Spouse name: Not on file   Number of children: Not on file   Years of education: Not on file   Highest education level: Not on file  Occupational History   Not on file  Tobacco Use   Smoking status: Former    Current packs/day: 0.00    Average packs/day: 0.5 packs/day for 15.0 years (7.5 ttl pk-yrs)    Types: Cigarettes    Start date: 2    Quit date: 2006    Years since quitting: 19.4   Smokeless tobacco: Never  Vaping Use   Vaping status: Never Used  Substance and Sexual Activity   Alcohol use: Not Currently    Comment: very rare   Drug use: Never   Sexual activity: Yes    Birth control/protection: Post-menopausal  Other Topics Concern   Not on file  Social History Narrative   Not on file   Social Drivers of Health   Financial Resource Strain: Not on file  Food Insecurity: No Food Insecurity (09/25/2022)   Hunger  Vital Sign    Worried About Running Out of Food in the Last Year: Never true    Ran Out of Food in the Last Year: Never true  Transportation Needs: No Transportation Needs (09/25/2022)   PRAPARE - Administrator, Civil Service (Medical): No    Lack of Transportation (Non-Medical): No  Physical Activity: Not on file  Stress: Not on file  Social Connections: Not on file  Intimate Partner Violence: Not At Risk (09/25/2022)   Humiliation, Afraid, Rape, and Kick questionnaire    Fear of Current or Ex-Partner: No    Emotionally Abused: No    Physically Abused: No    Sexually Abused: No     Constitutional: Denies fever, malaise, headache, fatigue or abrupt weight changes.  HEENT: Denies eye pain, eye redness, ear pain, ringing in the ears, wax buildup, runny nose, nasal congestion, bloody nose, or sore throat. Respiratory: Denies difficulty breathing, shortness of breath, cough or sputum production.   Cardiovascular: Denies chest pain, chest tightness, palpitations or swelling in the hands or feet.  Gastrointestinal: Patient reports intermittent constipation.  Denies abdominal pain, bloating, diarrhea or blood in the stool.  GU: Denies urgency, frequency, pain with urination, burning sensation, blood in urine, odor or discharge. Musculoskeletal: Denies decrease in range of motion, difficulty with gait, muscle pain or joint pain and swelling.  Skin: Denies redness, rashes, lesions or ulcercations.  Neurological: Pt reports insomnia. Denies dizziness, difficulty with memory, difficulty with speech or problems with balance and coordination.  Psych: Pt has a history of anxiety and depression. Denies SI/HI.  No other specific complaints in a complete review of systems (except as listed in HPI above).  Objective:   Physical Exam   There were no vitals taken for this visit.  Wt Readings from Last 3 Encounters:  11/12/23 272 lb 6.4 oz (123.6 kg)  02/17/23 254 lb (115.2 kg)   01/06/23 261 lb (118.4 kg)    General: Appears her stated age, obese, in NAD. Skin: Warm, dry and intact.  HEENT: Head: normal shape and size; Eyes: sclera white, no icterus, conjunctiva pink, PERRLA and EOMs intact;  Neck:  Neck supple, trachea midline. No masses, lumps or thyromegaly present.  Cardiovascular: Tachycardic with normal rhythm. S1,S2 noted.  No murmur, rubs or gallops noted. No JVD or BLE edema.  No carotid bruits noted. Pulmonary/Chest: Normal effort and positive vesicular breath sounds. No respiratory distress. No wheezes, rales or ronchi noted.  Abdomen: Normal bowel sounds.  Musculoskeletal: Strength 5/5 BUE/BLE. No difficulty with gait.  Neurological: Alert and oriented. Cranial nerves II-XII grossly intact. Coordination normal.  Psychiatric: Mood and affect normal. Tearful. Judgment and thought content normal.    BMET    Component Value Date/Time   NA 135 09/26/2022 0546   NA 138 08/03/2019 0000   NA 142 12/11/2011 1941   K 4.5 09/26/2022 0546   K 4.1 12/11/2011 1941   CL 104 09/26/2022 0546   CL 104 12/11/2011 1941   CO2 23 09/26/2022 0546   CO2 27 12/11/2011 1941   GLUCOSE 116 (H) 09/26/2022 0546   GLUCOSE 83 12/11/2011 1941   BUN 22 09/26/2022 0546   BUN 19 08/03/2019 0000   BUN 20 (H) 12/11/2011 1941   CREATININE 0.95 09/26/2022 0546   CREATININE 0.81 12/11/2011 1941   CALCIUM 8.7 (L) 09/26/2022 0546   CALCIUM 9.1 12/11/2011 1941   GFRNONAA >60 09/26/2022 0546   GFRNONAA >60 12/11/2011 1941   GFRAA >60 02/18/2017 1109   GFRAA >60 12/11/2011 1941    Lipid Panel     Component Value Date/Time   CHOL 205 (A) 08/03/2019 0000   TRIG 91 08/03/2019 0000   HDL 66 08/03/2019 0000   LDLCALC 123 08/03/2019 0000    CBC    Component Value Date/Time   WBC 16.0 (H) 09/26/2022 0546   RBC 4.24 09/26/2022 0546   HGB 12.4 09/26/2022 0546   HGB 14.4 12/11/2011 1941   HCT 37.7 09/26/2022 0546   HCT 42.4 12/11/2011 1941   PLT 397 09/26/2022 0546   PLT  371 12/11/2011 1941   MCV 88.9 09/26/2022 0546   MCV 88 12/11/2011 1941   MCH 29.2 09/26/2022 0546   MCHC 32.9 09/26/2022 0546   RDW 12.9 09/26/2022 0546   RDW 13.3 12/11/2011 1941   LYMPHSABS 1.8 11/18/2021 1040   MONOABS 0.8 11/18/2021 1040   EOSABS 0.2 11/18/2021 1040   BASOSABS 0.1 11/18/2021 1040    Hgb A1C Lab Results  Component Value Date   HGBA1C 5.4 04/15/2018           Assessment & Plan:   Preventative Health Maintenance:  Encouraged her to get a flu shot in the fall Tetanus declined Encouraged her to get her COVID booster Discussed Shingrix vaccine, she will check coverage with her insurance company and schedule a visit if she would like to have this done She no longer needs Pap smears Mammogram and bone density ordered-she will call to schedule Colon screening UTD Encouraged her to consume a balanced diet and exercise regimen Advised her to see an eye doctor and dentist annually We will check CBC, c-Met, lipid, A1c, HIV and hep C today  RTC in 6 months, follow-up chronic conditions Helayne Lo, NP

## 2024-04-06 ENCOUNTER — Other Ambulatory Visit: Payer: Self-pay | Admitting: Internal Medicine

## 2024-04-07 NOTE — Telephone Encounter (Signed)
 Requested Prescriptions  Pending Prescriptions Disp Refills   lisinopril  (ZESTRIL ) 10 MG tablet [Pharmacy Med Name: LISINOPRIL  10MG  TABLETS] 90 tablet 0    Sig: TAKE 1 TABLET(10 MG) BY MOUTH DAILY     Cardiovascular:  ACE Inhibitors Failed - 04/07/2024  2:26 PM      Failed - Cr in normal range and within 180 days    Creatinine  Date Value Ref Range Status  12/11/2011 0.81 0.60 - 1.30 mg/dL Final   Creatinine, Ser  Date Value Ref Range Status  09/26/2022 0.95 0.44 - 1.00 mg/dL Final         Failed - K in normal range and within 180 days    Potassium  Date Value Ref Range Status  09/26/2022 4.5 3.5 - 5.1 mmol/L Final  12/11/2011 4.1 3.5 - 5.1 mmol/L Final         Passed - Patient is not pregnant      Passed - Last BP in normal range    BP Readings from Last 1 Encounters:  11/12/23 138/88         Passed - Valid encounter within last 6 months    Recent Outpatient Visits           4 months ago Prediabetes   [facility] Coleman, Angeline ORN, NP

## 2024-04-29 ENCOUNTER — Ambulatory Visit (INDEPENDENT_AMBULATORY_CARE_PROVIDER_SITE_OTHER): Admitting: Internal Medicine

## 2024-04-29 ENCOUNTER — Encounter: Payer: Self-pay | Admitting: Internal Medicine

## 2024-04-29 VITALS — BP 128/84 | Ht 61.0 in | Wt 267.8 lb

## 2024-04-29 DIAGNOSIS — Z1159 Encounter for screening for other viral diseases: Secondary | ICD-10-CM

## 2024-04-29 DIAGNOSIS — Z0001 Encounter for general adult medical examination with abnormal findings: Secondary | ICD-10-CM | POA: Diagnosis not present

## 2024-04-29 DIAGNOSIS — R7303 Prediabetes: Secondary | ICD-10-CM | POA: Diagnosis not present

## 2024-04-29 DIAGNOSIS — R140 Abdominal distension (gaseous): Secondary | ICD-10-CM

## 2024-04-29 DIAGNOSIS — Z136 Encounter for screening for cardiovascular disorders: Secondary | ICD-10-CM

## 2024-04-29 DIAGNOSIS — Z114 Encounter for screening for human immunodeficiency virus [HIV]: Secondary | ICD-10-CM

## 2024-04-29 DIAGNOSIS — R1013 Epigastric pain: Secondary | ICD-10-CM | POA: Diagnosis not present

## 2024-04-29 MED ORDER — LISINOPRIL 10 MG PO TABS: 10.0000 mg | ORAL_TABLET | Freq: Every day | ORAL | 1 refills | Status: AC

## 2024-04-29 NOTE — Progress Notes (Signed)
 Subjective:    Patient ID: Terri Gamble, female    DOB: 1960/09/18, 63 y.o.   MRN: [id number]  HPI  Patient presents to clinic today for her annual exam.  Flu: not this past year Tetanus: > 10 years ago COVID: Pfizer x 2 Shingrix: Never Pap smear: Hysterectomy Mammogram: 2024, [facility] Bone density: never Colon screening: 05/2015 Vision screening: as needed Dentist: biannually  Diet: She does eat meat. She consumes fruits and veggies. She does eat some fried foods. She drinks water , Dt. Soda. Exercise: None  Review of Systems     Past Medical History:  Diagnosis Date   Complication of anesthesia    per pt with d&c hysteroscopy done at [facility] 07-03-2017 felt dizzy, headaches, and just did not feel well   Diverticulosis of colon    Endometrial polyp    History of URI (upper respiratory infection) 08/25/2022   recent dx acute bacterial sinusitis and acute cough by pcp note in epic 08-25-2022 per pt had sympstoms for 1 wk prior to dx ,  pt stated completed antibiotic and prednisone  on 12/ 25/ 2023   HSV infection    oral symptoms, uses valtrex for symptoms   Hypertension    Menorrhagia    Seasonal allergies    Thrombocytosis    Uterine fibroid     Current Outpatient Medications  Medication Sig Dispense Refill   BIOTIN PO Take 2,500 mcg by mouth daily.     Cholecalciferol (VITAMIN D3 PO) Take 2 capsules by mouth daily.     fluticasone  (FLONASE ) 50 MCG/ACT nasal spray Place 2 sprays into both nostrils daily. 16 g 0   lisinopril  (ZESTRIL ) 10 MG tablet TAKE 1 TABLET(10 MG) BY MOUTH DAILY 90 tablet 0   Semaglutide -Weight Management 0.25 MG/0.5ML SOAJ Inject 0.25 mg into the skin once a week. 2 mL 0   valACYclovir (VALTREX) 1000 MG tablet Take 1,000 mg by mouth daily as needed (fever blisters).     No current facility-administered medications for this visit.    Allergies  Allergen Reactions   Codeine Nausea Only   Morphine  And Codeine Other (See Comments)     Headache   Latex Rash    Family History  Problem Relation Age of Onset   Heart disease Mother    Kidney disease Mother    Hypertension Mother    Diabetes Mother    Colon cancer Brother 72   Diabetes Maternal Grandmother    Hypertension Maternal Grandmother    Heart disease Maternal Grandmother    Stroke Maternal Grandmother    Heart disease Maternal Grandfather    Cardiomyopathy Maternal Grandfather    Heart disease Paternal Grandmother    Stroke Paternal Grandmother    Hypertension Paternal Grandmother    Alzheimer's disease Paternal Grandfather    Pancreatic cancer Maternal Aunt        d. 19s   Lung cancer Maternal Uncle        d. 64s   Stomach cancer Paternal Uncle        d. >50   Liver cancer Paternal Uncle        d.>50   Lung cancer Paternal Uncle        d.>50   Colon cancer Cousin        double first cousin - d. late 22s   Colon cancer Cousin    Lung cancer Cousin    Esophageal cancer Neg Hx    Liver disease Neg Hx    Ovarian cancer  Neg Hx    Breast cancer Neg Hx     Social History   Socioeconomic History   Marital status: Divorced    Spouse name: Not on file   Number of children: Not on file   Years of education: Not on file   Highest education level: Not on file  Occupational History   Not on file  Tobacco Use   Smoking status: Former    Current packs/day: 0.00    Average packs/day: 0.5 packs/day for 15.0 years (7.5 ttl pk-yrs)    Types: Cigarettes    Start date: 64    Quit date: 2006    Years since quitting: 19.6   Smokeless tobacco: Never  Vaping Use   Vaping status: Never Used  Substance and Sexual Activity   Alcohol use: Not Currently    Comment: very rare   Drug use: Never   Sexual activity: Yes    Birth control/protection: Post-menopausal  Other Topics Concern   Not on file  Social History Narrative   Not on file   Social Drivers of Health   Financial Resource Strain: Not on file  Food Insecurity: No Food Insecurity  (09/25/2022)   Hunger Vital Sign    Worried About Running Out of Food in the Last Year: Never true    Ran Out of Food in the Last Year: Never true  Transportation Needs: No Transportation Needs (09/25/2022)   PRAPARE - Administrator, Civil Service (Medical): No    Lack of Transportation (Non-Medical): No  Physical Activity: Not on file  Stress: Not on file  Social Connections: Not on file  Intimate Partner Violence: Not At Risk (09/25/2022)   Humiliation, Afraid, Rape, and Kick questionnaire    Fear of Current or Ex-Partner: No    Emotionally Abused: No    Physically Abused: No    Sexually Abused: No     Constitutional: Denies fever, malaise, headache, fatigue or abrupt weight changes.  HEENT: Denies eye pain, eye redness, ear pain, ringing in the ears, wax buildup, runny nose, nasal congestion, bloody nose, or sore throat. Respiratory: Denies difficulty breathing, shortness of breath, cough or sputum production.   Cardiovascular: Denies chest pain, chest tightness, palpitations or swelling in the hands or feet.  Gastrointestinal: Patient reports abdominal pain, bloating, gassiness.  Denies abdominal pain, bloating, constipation,  or blood in the stool.  GU: Denies urgency, frequency, pain with urination, burning sensation, blood in urine, odor or discharge. Musculoskeletal: Denies decrease in range of motion, difficulty with gait, muscle pain or joint pain and swelling.  Skin: Denies redness, rashes, lesions or ulcercations.  Neurological: Pt reports insomnia. Denies dizziness, difficulty with memory, difficulty with speech or problems with balance and coordination.  Psych: Pt has a history of anxiety and depression. Denies SI/HI.  No other specific complaints in a complete review of systems (except as listed in HPI above).  Objective:   Physical Exam  BP 128/84 (BP Location: Left Arm, Patient Position: Sitting, Cuff Size: Large)   Ht 5' 1 (1.549 m)   Wt 267 lb 12.8  oz (121.5 kg)   BMI 50.60 kg/m    Wt Readings from Last 3 Encounters:  11/12/23 272 lb 6.4 oz (123.6 kg)  02/17/23 254 lb (115.2 kg)  01/06/23 261 lb (118.4 kg)    General: Appears her stated age, obese, in NAD. Skin: Warm, dry and intact.  HEENT: Head: normal shape and size; Eyes: sclera white, no icterus, conjunctiva pink, PERRLA and EOMs  intact;  Neck:  Neck supple, trachea midline. No masses, lumps or thyromegaly present.  Cardiovascular: Tachycardic with normal rhythm. S1,S2 noted.  No murmur, rubs or gallops noted. No JVD or BLE edema. No carotid bruits noted. Pulmonary/Chest: Normal effort and positive vesicular breath sounds. No respiratory distress. No wheezes, rales or ronchi noted.  Abdomen: Normal bowel sounds.  Musculoskeletal: Strength 5/5 BUE/BLE. No difficulty with gait.  Neurological: Alert and oriented. Cranial nerves II-XII grossly intact. Coordination normal.  Psychiatric: Mood and affect normal. Tearful. Judgment and thought content normal.    BMET    Component Value Date/Time   NA 135 09/26/2022 0546   NA 138 08/03/2019 0000   NA 142 12/11/2011 1941   K 4.5 09/26/2022 0546   K 4.1 12/11/2011 1941   CL 104 09/26/2022 0546   CL 104 12/11/2011 1941   CO2 23 09/26/2022 0546   CO2 27 12/11/2011 1941   GLUCOSE 116 (H) 09/26/2022 0546   GLUCOSE 83 12/11/2011 1941   BUN 22 09/26/2022 0546   BUN 19 08/03/2019 0000   BUN 20 (H) 12/11/2011 1941   CREATININE 0.95 09/26/2022 0546   CREATININE 0.81 12/11/2011 1941   CALCIUM 8.7 (L) 09/26/2022 0546   CALCIUM 9.1 12/11/2011 1941   GFRNONAA >60 09/26/2022 0546   GFRNONAA >60 12/11/2011 1941   GFRAA >60 02/18/2017 1109   GFRAA >60 12/11/2011 1941    Lipid Panel     Component Value Date/Time   CHOL 205 (A) 08/03/2019 0000   TRIG 91 08/03/2019 0000   HDL 66 08/03/2019 0000   LDLCALC 123 08/03/2019 0000    CBC    Component Value Date/Time   WBC 16.0 (H) 09/26/2022 0546   RBC 4.24 09/26/2022 0546   HGB  12.4 09/26/2022 0546   HGB 14.4 12/11/2011 1941   HCT 37.7 09/26/2022 0546   HCT 42.4 12/11/2011 1941   PLT 397 09/26/2022 0546   PLT 371 12/11/2011 1941   MCV 88.9 09/26/2022 0546   MCV 88 12/11/2011 1941   MCH 29.2 09/26/2022 0546   MCHC 32.9 09/26/2022 0546   RDW 12.9 09/26/2022 0546   RDW 13.3 12/11/2011 1941   LYMPHSABS 1.8 11/18/2021 1040   MONOABS 0.8 11/18/2021 1040   EOSABS 0.2 11/18/2021 1040   BASOSABS 0.1 11/18/2021 1040    Hgb A1C Lab Results  Component Value Date   HGBA1C 5.4 04/15/2018           Assessment & Plan:   Preventative Health Maintenance:  Encouraged her to get a flu shot in the fall Tetanus declined Encouraged her to get her COVID booster Discussed Shingrix vaccine, she will check coverage with her insurance company and schedule a visit if she would like to have this done She no longer needs Pap smears Mammogram and bone density will be ordered by GYN, will request copy Colon screening UTD Encouraged her to consume a balanced diet and exercise regimen Advised her to see an eye doctor and dentist annually We will check CBC, c-Met, lipid, A1c, HIV and hep C today  Epigastric pain, bloating and gassiness:  Will check h pylori breath test today We will follow-up after lab results with further recommendation and treatment plan  RTC in 6 months, follow-up chronic conditions Angeline Laura, NP

## 2024-04-29 NOTE — Assessment & Plan Note (Signed)
 Encouraged diet and exercise for weight loss ?

## 2024-04-29 NOTE — Patient Instructions (Signed)

## 2024-05-02 ENCOUNTER — Ambulatory Visit: Payer: Self-pay | Admitting: Internal Medicine

## 2024-05-03 ENCOUNTER — Telehealth: Payer: Self-pay

## 2024-05-03 NOTE — Telephone Encounter (Signed)
 Form

## 2024-05-04 LAB — COMPREHENSIVE METABOLIC PANEL WITH GFR
ALT: 22 IU/L (ref 0–32)
AST: 21 IU/L (ref 0–40)
Albumin: 4.3 g/dL (ref 3.9–4.9)
Alkaline Phosphatase: 130 IU/L — ABNORMAL HIGH (ref 44–121)
BUN/Creatinine Ratio: 26 (ref 12–28)
BUN: 23 mg/dL (ref 8–27)
Bilirubin Total: <0.2 mg/dL (ref 0.0–1.2)
CO2: 17 mmol/L — ABNORMAL LOW (ref 20–29)
Calcium: 10 mg/dL (ref 8.7–10.3)
Chloride: 105 mmol/L (ref 96–106)
Creatinine, Ser: 0.9 mg/dL (ref 0.57–1.00)
Globulin, Total: 3.2 g/dL (ref 1.5–4.5)
Glucose: 93 mg/dL (ref 70–99)
Potassium: 5 mmol/L (ref 3.5–5.2)
Sodium: 142 mmol/L (ref 134–144)
Total Protein: 7.5 g/dL (ref 6.0–8.5)
eGFR: 71 mL/min/1.73 (ref >59)

## 2024-05-04 LAB — CBC
Hematocrit: 43.3 % (ref 34.0–46.6)
Hemoglobin: 14.5 g/dL (ref 11.1–15.9)
MCH: 29.6 pg (ref 26.6–33.0)
MCHC: 33.5 g/dL (ref 31.5–35.7)
MCV: 88 fL (ref 79–97)
Platelets: 427 x10E3/uL (ref 150–450)
RBC: 4.9 x10E6/uL (ref 3.77–5.28)
RDW: 12.8 % (ref 11.7–15.4)
WBC: 9.7 x10E3/uL (ref 3.4–10.8)

## 2024-05-04 LAB — NICOTINE/COTININE METABOLITES
Cotinine: <1 ng/mL
Nicotine: <1 ng/mL

## 2024-05-04 LAB — LIPID PANEL
Chol/HDL Ratio: 3.4 ratio (ref 0.0–4.4)
Cholesterol, Total: 203 mg/dL — ABNORMAL HIGH (ref 100–199)
HDL: 60 mg/dL (ref >39)
LDL Chol Calc (NIH): 119 mg/dL — ABNORMAL HIGH (ref 0–99)
Triglycerides: 136 mg/dL (ref 0–149)
VLDL Cholesterol Cal: 24 mg/dL (ref 5–40)

## 2024-05-04 LAB — H. PYLORI BREATH TEST

## 2024-05-04 LAB — HIV ANTIBODY (ROUTINE TESTING W REFLEX): HIV Screen 4th Generation wRfx: NONREACTIVE

## 2024-05-04 LAB — HEPATITIS C ANTIBODY: Hep C Virus Ab: NONREACTIVE

## 2024-05-04 LAB — H. PYLORI BREATH COLLECTION

## 2024-05-04 LAB — HEMOGLOBIN A1C
Est. average glucose Bld gHb Est-mCnc: 120 mg/dL
Hgb A1c MFr Bld: 5.8 % — ABNORMAL HIGH (ref 4.8–5.6)

## 2024-05-18 ENCOUNTER — Encounter: Admitting: Internal Medicine

## 2024-05-20 NOTE — Telephone Encounter (Signed)
 Will need an appt. Medication will require prior auth and need documentation including weight loss attempts, current diet and exercise regimen

## 2024-05-26 ENCOUNTER — Encounter: Payer: Self-pay | Admitting: Internal Medicine

## 2024-05-27 ENCOUNTER — Ambulatory Visit: Admitting: Internal Medicine

## 2024-05-27 ENCOUNTER — Encounter: Payer: Self-pay | Admitting: Internal Medicine

## 2024-05-27 VITALS — BP 124/74 | Ht 61.0 in | Wt 263.4 lb

## 2024-05-27 DIAGNOSIS — E66813 Obesity, class 3: Secondary | ICD-10-CM | POA: Diagnosis not present

## 2024-05-27 DIAGNOSIS — Z6841 Body Mass Index (BMI) 40.0 and over, adult: Secondary | ICD-10-CM | POA: Diagnosis not present

## 2024-05-27 DIAGNOSIS — R7303 Prediabetes: Secondary | ICD-10-CM | POA: Diagnosis not present

## 2024-05-27 MED ORDER — SEMAGLUTIDE-WEIGHT MANAGEMENT 0.25 MG/0.5ML ~~LOC~~ SOAJ: 0.2500 mg | SUBCUTANEOUS | 0 refills | Status: AC

## 2024-05-27 NOTE — Progress Notes (Signed)
 Subjective:    Patient ID: Terri Gamble, female    DOB: [birth year], 63 y.o.   MRN: [id number]  HPI  Discussed the use of AI scribe software for clinical note transcription with the patient, who gave verbal consent to proceed.  Terri Gamble is a 63 year old female who presents with weight loss concerns.  She has been participating in Weight Watchers for 6 months and has experienced intermittent weight loss, totaling approximately ten pounds, which she has managed to maintain. She incorporates walking a couple of days a week as part of her exercise routine.  She has a history of prior weight loss attempts, including B12 shots and HCG treatments through a wellness clinic, which did not result in significant weight loss but improved her energy levels. Five years ago, she weighed fifty pounds less than her current weight, attributing the gain to stress eating.  She has a weight loss goal of losing 100 pounds, with an initial target of 50 pounds to feel better.  She reports a recent high A1c level indicating prediabetes, but she does not have diabetes. She also has a history of HTN. She has not undergone a sleep study and does not have a history of aortic atherosclerosis, heart attack, stroke, peripheral arterial disease, or chronic kidney disease.       Review of Systems     Past Medical History:  Diagnosis Date   Complication of anesthesia    per pt with d&c hysteroscopy done at [facility] 07-03-2017 felt dizzy, headaches, and just did not feel well   Diverticulosis of colon    Endometrial polyp    History of URI (upper respiratory infection) 08/25/2022   recent dx acute bacterial sinusitis and acute cough by pcp note in epic 08-25-2022 per pt had sympstoms for 1 wk prior to dx ,  pt stated completed antibiotic and prednisone  on 12/ 25/ 2023   HSV infection    oral symptoms, uses valtrex for symptoms   Hypertension    Menorrhagia    Seasonal allergies    Thrombocytosis     Uterine fibroid     Current Outpatient Medications  Medication Sig Dispense Refill   lisinopril  (ZESTRIL ) 10 MG tablet Take 1 tablet (10 mg total) by mouth daily. 90 tablet 1   valACYclovir (VALTREX) 1000 MG tablet Take 1,000 mg by mouth daily as needed (fever blisters).     No current facility-administered medications for this visit.    Allergies  Allergen Reactions   Codeine Nausea Only   Morphine  And Codeine Other (See Comments)    Headache   Latex Rash    Family History  Problem Relation Age of Onset   Heart disease Mother    Kidney disease Mother    Hypertension Mother    Diabetes Mother    Colon cancer Brother 91   Diabetes Maternal Grandmother    Hypertension Maternal Grandmother    Heart disease Maternal Grandmother    Stroke Maternal Grandmother    Heart disease Maternal Grandfather    Cardiomyopathy Maternal Grandfather    Heart disease Paternal Grandmother    Stroke Paternal Grandmother    Hypertension Paternal Grandmother    Alzheimer's disease Paternal Grandfather    Pancreatic cancer Maternal Aunt        d. 70s   Lung cancer Maternal Uncle        d. 10s   Stomach cancer Paternal Uncle        d. >50   Liver  cancer Paternal Uncle        d.>50   Lung cancer Paternal Uncle        d.>50   Colon cancer Cousin        double first cousin - d. late 52s   Colon cancer Cousin    Lung cancer Cousin    Esophageal cancer Neg Hx    Liver disease Neg Hx    Ovarian cancer Neg Hx    Breast cancer Neg Hx     Social History   Socioeconomic History   Marital status: Divorced    Spouse name: Not on file   Number of children: Not on file   Years of education: Not on file   Highest education level: Not on file  Occupational History   Not on file  Tobacco Use   Smoking status: Former    Current packs/day: 0.00    Average packs/day: 0.5 packs/day for 15.0 years (7.5 ttl pk-yrs)    Types: Cigarettes    Start date: 36    Quit date: 2006    Years since  quitting: 19.7   Smokeless tobacco: Never  Vaping Use   Vaping status: Never Used  Substance and Sexual Activity   Alcohol use: Not Currently    Comment: very rare   Drug use: Never   Sexual activity: Yes    Birth control/protection: Post-menopausal  Other Topics Concern   Not on file  Social History Narrative   Not on file   Social Drivers of Health   Financial Resource Strain: Not on file  Food Insecurity: No Food Insecurity (09/25/2022)   Hunger Vital Sign    Worried About Running Out of Food in the Last Year: Never true    Ran Out of Food in the Last Year: Never true  Transportation Needs: No Transportation Needs (09/25/2022)   PRAPARE - Administrator, Civil Service (Medical): No    Lack of Transportation (Non-Medical): No  Physical Activity: Not on file  Stress: Not on file  Social Connections: Not on file  Intimate Partner Violence: Not At Risk (09/25/2022)   Humiliation, Afraid, Rape, and Kick questionnaire    Fear of Current or Ex-Partner: No    Emotionally Abused: No    Physically Abused: No    Sexually Abused: No     Constitutional: Denies fever, malaise, headache, fatigue or abrupt weight changes.  Respiratory: Denies difficulty breathing, shortness of breath, cough or sputum production.   Cardiovascular: Denies chest pain, chest tightness, palpitations or swelling in the hands or feet.  Gastrointestinal:  Denies abdominal pain, bloating, constipation,  or blood in the stool.  GU: Denies urgency, frequency, pain with urination, burning sensation, blood in urine, odor or discharge. Musculoskeletal: Denies decrease in range of motion, difficulty with gait, muscle pain or joint pain and swelling.  Skin: Denies redness, rashes, lesions or ulcercations.  Neurological: Pt reports insomnia. Denies dizziness, difficulty with memory, difficulty with speech or problems with balance and coordination.  Psych: Pt has a history of anxiety and depression. Denies  SI/HI.  No other specific complaints in a complete review of systems (except as listed in HPI above).  Objective:   Physical Exam  BP 124/74 (BP Location: Left Arm, Patient Position: Sitting, Cuff Size: Large)   Ht 5' 1 (1.549 m)   Wt 263 lb 6.4 oz (119.5 kg)   BMI 49.77 kg/m     Wt Readings from Last 3 Encounters:  04/29/24 267 lb 12.8 oz (121.5  kg)  11/12/23 272 lb 6.4 oz (123.6 kg)  02/17/23 254 lb (115.2 kg)    General: Appears her stated age, obese, in NAD. Skin: Warm, dry and intact.  Cardiovascular: Tachycardic with normal rhythm. Pulmonary/Chest: Normal effort. No respiratory distress. Musculoskeletal: No difficulty with gait.  Neurological: Alert and oriented.    BMET    Component Value Date/Time   NA 142 04/29/2024 1056   NA 142 12/11/2011 1941   K 5.0 04/29/2024 1056   K 4.1 12/11/2011 1941   CL 105 04/29/2024 1056   CL 104 12/11/2011 1941   CO2 17 (L) 04/29/2024 1056   CO2 27 12/11/2011 1941   GLUCOSE 93 04/29/2024 1056   GLUCOSE 116 (H) 09/26/2022 0546   GLUCOSE 83 12/11/2011 1941   BUN 23 04/29/2024 1056   BUN 20 (H) 12/11/2011 1941   CREATININE 0.90 04/29/2024 1056   CREATININE 0.81 12/11/2011 1941   CALCIUM 10.0 04/29/2024 1056   CALCIUM 9.1 12/11/2011 1941   GFRNONAA >60 09/26/2022 0546   GFRNONAA >60 12/11/2011 1941   GFRAA >60 02/18/2017 1109   GFRAA >60 12/11/2011 1941    Lipid Panel     Component Value Date/Time   CHOL 203 (H) 04/29/2024 1056   TRIG 136 04/29/2024 1056   HDL 60 04/29/2024 1056   CHOLHDL 3.4 04/29/2024 1056   LDLCALC 119 (H) 04/29/2024 1056    CBC    Component Value Date/Time   WBC 9.7 04/29/2024 1056   WBC 16.0 (H) 09/26/2022 0546   RBC 4.90 04/29/2024 1056   RBC 4.24 09/26/2022 0546   HGB 14.5 04/29/2024 1056   HCT 43.3 04/29/2024 1056   PLT 427 04/29/2024 1056   MCV 88 04/29/2024 1056   MCV 88 12/11/2011 1941   MCH 29.6 04/29/2024 1056   MCH 29.2 09/26/2022 0546   MCHC 33.5 04/29/2024 1056   MCHC  32.9 09/26/2022 0546   RDW 12.8 04/29/2024 1056   RDW 13.3 12/11/2011 1941   LYMPHSABS 1.8 11/18/2021 1040   MONOABS 0.8 11/18/2021 1040   EOSABS 0.2 11/18/2021 1040   BASOSABS 0.1 11/18/2021 1040    Hgb A1C Lab Results  Component Value Date   HGBA1C 5.8 (H) 04/29/2024           Assessment & Plan:   Assessment and Plan    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 Weight loss attempts with Weight Watchers, B12 shots, and HCG unsuccessful. Considering Wegovy  for weight loss. Discussed potential side effects of Wegovy . Insurance may cover due to [employer] participation. CT coronary calcium scan considered if insurance denies coverage. - Submit prior authorization for Wegovy  0.25 mg weekly to insurance. - Provide Wegovy  sample if available. - Consider CT coronary calcium scan if insurance denies coverage.  Prediabetes Recent high A1c. Does not meet insurance criteria for diabetes-related coverage of weight loss medications. Discussed distinction between prediabetes and diabetes for insurance purposes.  -Encouraged low carb diet      RTC in 5 months, follow-up chronic conditions Angeline Laura, NP

## 2024-05-27 NOTE — Patient Instructions (Signed)

## 2024-05-31 ENCOUNTER — Other Ambulatory Visit (HOSPITAL_COMMUNITY): Payer: Self-pay

## 2024-05-31 ENCOUNTER — Telehealth: Payer: Self-pay | Admitting: Pharmacy Technician

## 2024-05-31 NOTE — Telephone Encounter (Signed)
 Pharmacy Patient Advocate Encounter   Received notification from Onbase that prior authorization for Wegovy  0.25mg /0.24ml auto-injectors is required/requested.   Insurance verification completed.   The patient is insured through [facility] .   Per test claim: PA required; PA submitted to above mentioned insurance via Latent Key/confirmation #/EOC [facility] Status is pending

## 2024-06-01 ENCOUNTER — Other Ambulatory Visit (HOSPITAL_COMMUNITY): Payer: Self-pay

## 2024-06-01 NOTE — Telephone Encounter (Signed)
 Pharmacy Patient Advocate Encounter  Received notification from [employer] that Prior Authorization for Wegovy  0.25mg /0.60ml auto-injectors has been DENIED.  Full denial letter will be uploaded to the media tab. See denial reason below.   PA #/Case ID/Reference #: [id number]  **[employer] enrollment is required for product coverage. Please visit [email or web address]

## 2024-06-02 NOTE — Telephone Encounter (Signed)
 Insurance denied wegovy . Would recommend CT coronary calcium score as discussed at the office visit so that if + for aortic atherosclerosis, we could possibly use that as the cardiac condition to resubmit for wegovy . Let me know if she is agreeable and I will order this.

## 2024-06-02 NOTE — Telephone Encounter (Signed)
 Left message for patient to return call OK  to see if patient wants to do the CT scan

## 2024-10-27 ENCOUNTER — Ambulatory Visit: Admitting: Internal Medicine
